# Patient Record
Sex: Male | Born: 1980 | Race: Black or African American | Hispanic: No | Smoking: Current every day smoker
Health system: Southern US, Community
[De-identification: ages and names within clinical notes are randomized; demographics above are authoritative.]

## PROBLEM LIST (undated history)

## (undated) DIAGNOSIS — M109 Gout, unspecified: Secondary | ICD-10-CM

## (undated) DIAGNOSIS — J45909 Unspecified asthma, uncomplicated: Secondary | ICD-10-CM

## (undated) DIAGNOSIS — H18603 Keratoconus, unspecified, bilateral: Secondary | ICD-10-CM

## (undated) DIAGNOSIS — I10 Essential (primary) hypertension: Secondary | ICD-10-CM

## (undated) DIAGNOSIS — R9431 Abnormal electrocardiogram [ECG] [EKG]: Secondary | ICD-10-CM

## (undated) DIAGNOSIS — H548 Legal blindness, as defined in USA: Secondary | ICD-10-CM

## (undated) DIAGNOSIS — R079 Chest pain, unspecified: Secondary | ICD-10-CM

## (undated) DIAGNOSIS — E785 Hyperlipidemia, unspecified: Secondary | ICD-10-CM

## (undated) HISTORY — DX: Abnormal electrocardiogram (ECG) (EKG): R94.31

## (undated) HISTORY — DX: Hyperlipidemia, unspecified: E78.5

## (undated) HISTORY — DX: Chest pain, unspecified: R07.9

## (undated) HISTORY — PX: OTHER SURGICAL HISTORY: SHX169

## (undated) HISTORY — PX: EYE SURGERY: SHX253

---
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
## 2002-09-08 ENCOUNTER — Emergency Department (HOSPITAL_COMMUNITY): Admission: EM | Admit: 2002-09-08 | Discharge: 2002-09-08 | Payer: Self-pay | Admitting: Emergency Medicine

## 2002-09-08 ENCOUNTER — Encounter: Payer: Self-pay | Admitting: Emergency Medicine

## 2003-08-16 ENCOUNTER — Emergency Department (HOSPITAL_COMMUNITY): Admission: EM | Admit: 2003-08-16 | Discharge: 2003-08-16 | Payer: Self-pay

## 2004-03-14 ENCOUNTER — Encounter: Payer: Self-pay | Admitting: Cardiology

## 2004-03-14 ENCOUNTER — Inpatient Hospital Stay (HOSPITAL_COMMUNITY): Admission: EM | Admit: 2004-03-14 | Discharge: 2004-03-15 | Payer: Self-pay | Admitting: *Deleted

## 2004-09-05 ENCOUNTER — Emergency Department (HOSPITAL_COMMUNITY): Admission: EM | Admit: 2004-09-05 | Discharge: 2004-09-05 | Payer: Self-pay | Admitting: Family Medicine

## 2006-04-18 ENCOUNTER — Ambulatory Visit: Payer: Self-pay | Admitting: Cardiology

## 2006-04-18 ENCOUNTER — Inpatient Hospital Stay (HOSPITAL_COMMUNITY): Admission: EM | Admit: 2006-04-18 | Discharge: 2006-04-19 | Payer: Self-pay | Admitting: Emergency Medicine

## 2006-10-07 ENCOUNTER — Emergency Department (HOSPITAL_COMMUNITY): Admission: EM | Admit: 2006-10-07 | Discharge: 2006-10-07 | Payer: Self-pay | Admitting: Emergency Medicine

## 2007-05-15 ENCOUNTER — Emergency Department (HOSPITAL_COMMUNITY): Admission: EM | Admit: 2007-05-15 | Discharge: 2007-05-15 | Payer: Self-pay | Admitting: Emergency Medicine

## 2007-07-05 ENCOUNTER — Emergency Department (HOSPITAL_COMMUNITY): Admission: EM | Admit: 2007-07-05 | Discharge: 2007-07-05 | Payer: Self-pay | Admitting: Emergency Medicine

## 2007-08-11 ENCOUNTER — Emergency Department (HOSPITAL_COMMUNITY): Admission: EM | Admit: 2007-08-11 | Discharge: 2007-08-11 | Payer: Self-pay | Admitting: Family Medicine

## 2007-08-23 ENCOUNTER — Emergency Department (HOSPITAL_COMMUNITY): Admission: EM | Admit: 2007-08-23 | Discharge: 2007-08-24 | Payer: Self-pay | Admitting: Emergency Medicine

## 2007-08-27 ENCOUNTER — Emergency Department (HOSPITAL_COMMUNITY): Admission: EM | Admit: 2007-08-27 | Discharge: 2007-08-27 | Payer: Self-pay | Admitting: Emergency Medicine

## 2008-11-08 ENCOUNTER — Emergency Department (HOSPITAL_COMMUNITY): Admission: EM | Admit: 2008-11-08 | Discharge: 2008-11-08 | Payer: Self-pay | Admitting: Family Medicine

## 2009-03-16 ENCOUNTER — Emergency Department (HOSPITAL_COMMUNITY): Admission: EM | Admit: 2009-03-16 | Discharge: 2009-03-16 | Payer: Self-pay | Admitting: Emergency Medicine

## 2009-03-24 ENCOUNTER — Emergency Department (HOSPITAL_COMMUNITY): Admission: EM | Admit: 2009-03-24 | Discharge: 2009-03-24 | Payer: Self-pay | Admitting: Family Medicine

## 2009-07-26 ENCOUNTER — Emergency Department (HOSPITAL_COMMUNITY): Admission: EM | Admit: 2009-07-26 | Discharge: 2009-07-26 | Payer: Self-pay | Admitting: Emergency Medicine

## 2009-08-19 ENCOUNTER — Emergency Department (HOSPITAL_COMMUNITY): Admission: EM | Admit: 2009-08-19 | Discharge: 2009-08-19 | Payer: Self-pay | Admitting: Emergency Medicine

## 2009-08-22 ENCOUNTER — Emergency Department (HOSPITAL_COMMUNITY): Admission: EM | Admit: 2009-08-22 | Discharge: 2009-08-22 | Payer: Self-pay | Admitting: Emergency Medicine

## 2009-08-24 ENCOUNTER — Emergency Department (HOSPITAL_COMMUNITY): Admission: EM | Admit: 2009-08-24 | Discharge: 2009-08-24 | Payer: Self-pay | Admitting: Emergency Medicine

## 2009-11-29 ENCOUNTER — Emergency Department (HOSPITAL_COMMUNITY): Admission: EM | Admit: 2009-11-29 | Discharge: 2009-11-30 | Payer: Self-pay | Admitting: Emergency Medicine

## 2010-02-13 ENCOUNTER — Emergency Department (HOSPITAL_COMMUNITY): Admission: EM | Admit: 2010-02-13 | Discharge: 2010-02-13 | Payer: Self-pay | Admitting: Emergency Medicine

## 2010-05-01 ENCOUNTER — Emergency Department (HOSPITAL_COMMUNITY): Admission: EM | Admit: 2010-05-01 | Discharge: 2010-05-01 | Payer: Self-pay | Admitting: Emergency Medicine

## 2010-05-02 DIAGNOSIS — E669 Obesity, unspecified: Secondary | ICD-10-CM | POA: Insufficient documentation

## 2010-05-02 DIAGNOSIS — I10 Essential (primary) hypertension: Secondary | ICD-10-CM | POA: Insufficient documentation

## 2010-05-02 DIAGNOSIS — M109 Gout, unspecified: Secondary | ICD-10-CM | POA: Insufficient documentation

## 2010-05-03 ENCOUNTER — Ambulatory Visit: Payer: Self-pay | Admitting: Cardiovascular Disease

## 2010-05-03 ENCOUNTER — Ambulatory Visit: Payer: Self-pay | Admitting: Cardiology

## 2010-05-03 ENCOUNTER — Observation Stay (HOSPITAL_COMMUNITY): Admission: AD | Admit: 2010-05-03 | Discharge: 2010-05-05 | Payer: Self-pay | Admitting: Cardiology

## 2010-05-03 ENCOUNTER — Encounter: Payer: Self-pay | Admitting: Physician Assistant

## 2010-05-03 DIAGNOSIS — E785 Hyperlipidemia, unspecified: Secondary | ICD-10-CM

## 2010-05-03 DIAGNOSIS — R079 Chest pain, unspecified: Secondary | ICD-10-CM | POA: Insufficient documentation

## 2010-05-03 DIAGNOSIS — R9431 Abnormal electrocardiogram [ECG] [EKG]: Secondary | ICD-10-CM

## 2010-05-03 HISTORY — DX: Chest pain, unspecified: R07.9

## 2010-05-03 HISTORY — DX: Hyperlipidemia, unspecified: E78.5

## 2010-05-03 HISTORY — DX: Abnormal electrocardiogram (ECG) (EKG): R94.31

## 2010-05-04 ENCOUNTER — Encounter: Payer: Self-pay | Admitting: Cardiology

## 2010-06-09 ENCOUNTER — Ambulatory Visit: Payer: Self-pay | Admitting: Pulmonary Disease

## 2010-06-12 ENCOUNTER — Telehealth: Payer: Self-pay | Admitting: Pulmonary Disease

## 2010-06-13 ENCOUNTER — Ambulatory Visit: Payer: Self-pay | Admitting: Cardiology

## 2010-06-24 ENCOUNTER — Emergency Department (HOSPITAL_COMMUNITY)
Admission: EM | Admit: 2010-06-24 | Discharge: 2010-06-24 | Payer: Self-pay | Source: Home / Self Care | Admitting: Emergency Medicine

## 2010-06-30 ENCOUNTER — Ambulatory Visit: Payer: Self-pay | Admitting: Pulmonary Disease

## 2010-07-04 ENCOUNTER — Telehealth: Payer: Self-pay | Admitting: Pulmonary Disease

## 2010-07-22 ENCOUNTER — Encounter: Payer: Self-pay | Admitting: Cardiology

## 2010-08-01 NOTE — Assessment & Plan Note (Signed)
Summary: eph/chest pain/mt   Visit Type:  Initial Consult Primary Perez Dirico:  none  CC:  chest pain -- anxiety -- HTN.  History of Present Illness: Primary Cardiologist:  Will be Dr. Shawnie Pons   Samuel Decker is a 30 yo male who recently went to the ED for evaluation of chest pain in the setting of anxiety.  No cardiac enzymes were checked.  His CXR was normal.  He did have an echocardiogram that demonstrated normal LVF and a GXT myoview that demonstrated no ischemia in 2005.  His blood pressures were 174/99 and 153/81 in the ED.  He states he was having an argument with his girlfriend on 05/01/2010 when he developed sharp substernal chest pain.  It lasted an hour or so.  He then developed back pain.  Denies a tearing or ripping sensation.  This lasted after he left the ED for 3-4 hours.   He denies any recent heavy lifting or pushing or pulling.  He denies assoc nausea, diaphoresis, dyspnea.  He is somewhat active.  He denies dyspnea with exertion.  Denies exertional chest pain.  He denies orthopnea or PND.  No edema.  No syncope.  No near syncope.  No lightheadedness.  No hemoptysis.  He did have some pleuritic chest pain when he went to the ED.  He has not had any further symptoms like this.  He denies recent URI symptoms.  Preventive Screening-Counseling & Management  Alcohol-Tobacco     Smoking Status: quit  Caffeine-Diet-Exercise     Does Patient Exercise: no      Drug Use:  no.    Current Medications (verified): 1)  None  Allergies (verified): No Known Drug Allergies  Past History:  Past Medical History: Hypertension Hyperlipidemia Gout Obesity h/o Childhood Seizures Asthma . . .no symptoms since childhood  Past Surgical History: s/p orchipexy 2/2 testicular torsion at age 40  Family History: CHF:  Dad Lupus Family History of Coronary Artery Disease: Dad with MI age 67s Family History of Depression:  Family History of Diabetes: Dad Family History of  Hyperlipidemia:  Family History of Hypertension: "everyone"  Social History: Full Time  --- Industrial/product designer (sales) Single -- engaged Tobacco Use - Former (remote smoker in past) Alcohol Use - not now Regular Exercise - no Drug Use - no Smoking Status:  quit Does Patient Exercise:  no Drug Use:  no  Review of Systems       The patient denies fevers, chills, cough, melena, hematochezia, dysuria, hematuria, rashes, arthralgias, myalgias, heat or cold intolerance.  Please see the HPI.  All other systems reviewed and negative.   Vital Signs:  Patient profile:   30 year old male Height:      73 inches Weight:      332 pounds BMI:     43.96 Pulse rate:   70 / minute BP sitting:   146 / 88  (right arm) Cuff size:   regular  Vitals Entered By: Hardin Negus, RMA (May 03, 2010 1:44 PM)  Physical Exam  General:  Well nourished, well developed, obese male in no acute distress HEENT: Normal Neck: No JVD Cardiac:  Normal S1, S2; RRR; no murmur; no murmur with valsalva Lungs:  Clear to auscultation bilaterally, no wheezing, rhonchi or rales Abd: Soft, nontender, no hepatomegaly, no bruits Ext: No clubbing, cyanosis or edema Vascular: No carotid  bruits; Femoral arteries without bruits; DP/PT 2+ bilaterally Skin: Warm and dry MSK:  No deformities Lymph: No adenopathy Psych:  Normally interactive Endocrine:  No thyromegaly Neuro:  CNs 2-12 grossly intact; strength normal in all extremities; no focal deficits   EKG  Procedure date:  05/03/2010  Findings:      Normal sinus rhythm with rate of:  70 Normal axis TW inversions in 1, aVL, 2, aVF, V3-6 LVH   Impression & Recommendations:  Problem # 1:  CHEST PAIN UNSPECIFIED (ICD-786.50)  Atypical symptoms. He does have a markedly abnormal EKG.  His prior EKGs from 2005, 2007 and 2009 have been reviewed and his changes have worsened over the years. No further symptoms now and he denies exertional symptoms. D/w Dr.  Riley Kill who also saw the patient today. After further discussions with the patient and his friend, he will be admitted [facility] for overnight observation. Start Toprol. Get chest CT if Creatinine ok to r/o Aortic Dissection.  Hold off on Aspirin with Chest CT pending.  If Chest CT ok, start ASA. Check a DDimer.   His updated medication list for this problem includes:    Metoprolol Succinate 50 Mg Xr24h-tab (Metoprolol succinate) .Marland Kitchen... Take 1 tablet by mouth once a day  Problem # 2:  HYPERTENSION (ICD-401.9)  Will start Toprol XL 50 mg once daily.  Orders: EKG w/ Interpretation (93000)  His updated medication list for this problem includes:    Metoprolol Succinate 50 Mg Xr24h-tab (Metoprolol succinate) .Marland Kitchen... Take 1 tablet by mouth once a day  Problem # 3:  HYPERLIPIDEMIA (ICD-272.4) Check FLP in the hospital.  Problem # 4:  OBESITY (ICD-278.00) Discussed weight loss today. He will need an OP sleep study.  Problem # 5:  ELECTROCARDIOGRAM, ABNORMAL (ICD-794.31) Check Echocardiogram.

## 2010-08-03 NOTE — Progress Notes (Signed)
Summary: nos appt  Phone Note Call from Patient   Caller: [email or web address]  Call For: clance Summary of Call: Rsc nos from 12/9 to 12/30. Initial call taken by: Darletta Moll,  June 12, 2010 2:53 PM

## 2010-08-03 NOTE — Progress Notes (Signed)
Summary: nos appt  Phone Note Call from Patient   Caller: [email or web address]  Call For: Samuel Decker Summary of Call: In ref to nos from 12/30, pt states he doesn't want to rsc. Initial call taken by: Darletta Moll,  July 04, 2010 10:39 AM

## 2010-09-11 LAB — POCT CARDIAC MARKERS
CKMB, poc: 1.4 ng/mL (ref 1.0–8.0)
Myoglobin, poc: 138 ng/mL (ref 12–200)
Troponin i, poc: <0.05 ng/mL (ref 0.00–0.09)

## 2010-09-11 LAB — POCT I-STAT, CHEM 8
BUN: 11 mg/dL (ref 6–23)
Calcium, Ion: 1.13 mmol/L (ref 1.12–1.32)
Chloride: 105 mEq/L (ref 96–112)
Creatinine, Ser: 1.1 mg/dL (ref 0.4–1.5)
Glucose, Bld: 94 mg/dL (ref 70–99)

## 2010-09-12 LAB — BASIC METABOLIC PANEL
Calcium: 9 mg/dL (ref 8.4–10.5)
Creatinine, Ser: 1.12 mg/dL (ref 0.4–1.5)
GFR calc Af Amer: >60 mL/min (ref >60)

## 2010-09-12 LAB — D-DIMER, QUANTITATIVE: D-Dimer, Quant: <0.22 ug/mL-FEU (ref 0.00–0.48)

## 2010-09-12 LAB — COMPREHENSIVE METABOLIC PANEL
ALT: 46 U/L (ref 0–53)
Albumin: 3.7 g/dL (ref 3.5–5.2)
Alkaline Phosphatase: 87 U/L (ref 39–117)
Glucose, Bld: 89 mg/dL (ref 70–99)
Potassium: 3.9 mEq/L (ref 3.5–5.1)
Sodium: 137 mEq/L (ref 135–145)
Total Protein: 7.4 g/dL (ref 6.0–8.3)

## 2010-09-12 LAB — CARDIAC PANEL(CRET KIN+CKTOT+MB+TROPI)
CK, MB: 2.3 ng/mL (ref 0.3–4.0)
CK, MB: 2.4 ng/mL (ref 0.3–4.0)
Relative Index: 0.6 (ref 0.0–2.5)
Relative Index: 0.6 (ref 0.0–2.5)
Relative Index: 0.7 (ref 0.0–2.5)
Total CK: 361 U/L — ABNORMAL HIGH (ref 7–232)
Troponin I: 0.01 ng/mL (ref 0.00–0.06)
Troponin I: 0.01 ng/mL (ref 0.00–0.06)
Troponin I: <0.01 ng/mL (ref 0.00–0.06)

## 2010-09-12 LAB — CBC
HCT: 42.4 % (ref 39.0–52.0)
RBC: 5.04 MIL/uL (ref 4.22–5.81)
RDW: 14 % (ref 11.5–15.5)
WBC: 10.4 10*3/uL (ref 4.0–10.5)

## 2010-09-12 LAB — TSH: TSH: 0.512 u[IU]/mL (ref 0.350–4.500)

## 2010-10-06 LAB — POCT I-STAT, CHEM 8
Creatinine, Ser: 1.1 mg/dL (ref 0.4–1.5)
HCT: 46 % (ref 39.0–52.0)
Hemoglobin: 15.6 g/dL (ref 13.0–17.0)
Potassium: 3.9 mEq/L (ref 3.5–5.1)
Sodium: 140 mEq/L (ref 135–145)
TCO2: 25 mmol/L (ref 0–100)

## 2010-10-06 LAB — URINE MICROSCOPIC-ADD ON

## 2010-10-06 LAB — URINALYSIS, ROUTINE W REFLEX MICROSCOPIC
Leukocytes, UA: NEGATIVE
Protein, ur: NEGATIVE mg/dL
Urobilinogen, UA: 0.2 mg/dL (ref 0.0–1.0)

## 2010-11-17 NOTE — H&P (Signed)
NAME:  Samuel Decker, Samuel Decker NO.:  [id number]   MEDICAL RECORD NO.:  [id number]                   PATIENT TYPE:  INP   LOCATION:  1828                                 FACILITY:  [facility]   PHYSICIAN:  Esmond Bing, M.D.               DATE OF BIRTH:  1980/12/28   DATE OF ADMISSION:  03/13/2004  DATE OF DISCHARGE:                                HISTORY & PHYSICAL   HISTORY OF PRESENT ILLNESS:  A 30 year old generally healthy gentleman  presented to urgent care with chest pain and found to have EKG abnormalities  prompting transfer to [facility]. Samuel Decker has enjoyed generally  good health. He had severe asthma as a child requiring multiple hospital  admissions, but has not had significant difficulty with this over the past  five years. He may have borderline hypertension, but has never been treated  for same. He denies use of tobacco products. There is no history of lipid  abnormalities nor diabetes. He has had intermittent chest discomfort for the  past two years that has been mild to moderate in severity. There may be in  relationship to exertion. 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 The pain intensified and  was associated with dyspnea prompting presentation for assessment to urgent  care. The discomfort was described as vague anterior chest tightness,  moderate severity without radiation. There was associated dyspnea, but no  diaphoresis. He did have some mild nausea. He was given sublingual  nitroglycerin with improvement of his symptoms. They are now recurring as  this evaluation is performed.   The patient is not excessively athletic, but does play some basketball. Work  is his most strenuous activity. He reports generally good exercise tolerance  without exertional dyspnea. He experienced no issues with activity as a  child, except as related to his asthma. He has not seen a  physician for some  time. He has not had medical coverage, but will have it soon in conjunction  with his current employment.   PAST MEDICAL HISTORY:  Otherwise remarkable for surgery some years ago for  testicular torsion. He takes no medication routinely. He has no known  allergies.   SOCIAL HISTORY:  No excessive use of alcohol. He works for Engineer, technical sales.  Unmarried.   FAMILY HISTORY:  His father had premature coronary disease, developing  myocardial infarction in his 50s. He is still alive, but has what sounds  like ischemic cardiomyopathy.   REVIEW OF SYSTEMS:  No GI problems. Intermittent headaches. All other  systems reviewed and are negative.   PHYSICAL EXAMINATION:  GENERAL: On exam, well-developed, healthy appearing,  overweight gentleman in no acute distress.  VITAL SIGNS: Temperature 98.1, blood pressure 140/60, heart rate 70 and  regular, respirations 16.  HEENT: Anicteric sclerae; normal lids and conjunctivae; pupils equal, round,  and reactive to light.  NECK: No jugular venous distention; no carotid bruits.  ENDOCRINE: No thyromegaly.  HEMATOPOIETIC: No adenopathy.  LUNGS: Clear.  CARDIAC: Normal first and second heart sounds.  ABDOMEN: Soft and nontender. No organomegaly.  EXTREMITIES: Normal distal pulses. No edema.  NEUROMUSCULAR: Symmetric strength and tone.  MUSCULOSKELETAL: No joint deformities.   Initial point of care markers are negative. Pulse oximetry is 98% on room  air. Drug screen is negative. CBC is normal. Chemistry profile is normal.   EKG: Normal sinus rhythm; inferolateral T-wave inversion. Two tracings  obtained at 6 p.m. and 8 p.m. are similar. Chest x-ray: Suboptimal  inspiration; NAD.   IMPRESSION:  Samuel Decker presents with chest discomfort that has a somewhat  ischemic quality and electrocardiogram abnormalities. At age 50, without  significant cardiovascular risk factors other than family history, his risks  of coronary disease is  extremely low. Other considerations include  cardiomyopathy, including hypertrophic cardiomyopathy, coronary artery  anomalies, Reye's syndrome, and sarcoid. Endocrine abnormalities including  hypothyroidism are of consideration as well as pulmonary embolism. Since his  chest pain is recurring, we will try intravenous nitroglycerin for  treatment. He will also be given aspirin and low-molecular weight heparin at  full doses. An echocardiogram will be obtained to explore for structural  heart disease. Serial cardiac markers, TSH level, and D-dimer have also been  requested. We will seek his most recent prior EKG to determine if these  abnormalities are chronic. Based on this initial testing and treatment,  further assessment will be obtained as appropriate.                                                Grainger Bing, M.D.    RR/MEDQ  D:  03/14/2004  T:  03/14/2004  Job:  [id number]

## 2010-11-17 NOTE — Discharge Summary (Signed)
NAME:  Samuel Decker, Samuel Decker                        ACCOUNT NO.:  [id number]   MEDICAL RECORD NO.:  [id number]                   PATIENT TYPE:  INP   LOCATION:  6524                                 FACILITY:  [facility]   PHYSICIAN:  Gene Serpe, P.A. [facility]                DATE OF BIRTH:  [birth year]   DATE OF ADMISSION:  03/13/2004  DATE OF DISCHARGE:                           DISCHARGE SUMMARY - REFERRING   DISCHARGE DIAGNOSES:  1.  Nonischemic chest pain.      1.  Normal serial cardiac markers/abnormal electrocardiogram.      2.  Normal exercise stress Cardiolite.      3.  Normal left ventricular function.  2.  Mixed dyslipidemia.  3.  History of asthma.  4.  Obesity.   PROCEDURES:  1.  Exercise stress Cardiolite, September 14th.  2.  2-D echocardiogram.   REASON FOR ADMISSION:  Please refer to admission note.   LABORATORY DATA:  Cardiac markers:  Peak CPK 608/normal MB and troponin-I  markers.  D-dimer 3.30.  WBC 9.5, hemoglobin 14.9/hematocrit 44, platelets  298, normal electrolytes and renal function.  Lipid profile:  Total  cholesterol 190, triglycerides 301, HDL 28, LDL 102 (cholesterol/HDL ratio  6.8).   ADMISSION CXR:  NAD.   HOSPITAL COURSE:  Patient ruled out for a myocardial infarction with normal  MB and troponin-I markers.  Total CPR was, however, elevated with a peak of  608.  Patient also presented with an abnormal electrocardiogram suggestive  of inferolateral ischemia.  Echocardiography, however, revealed no WNAs with  normal left ventricular function (55-65%).   Patient was subsequently referred for exercise stress testing.  Perfusion  imaging was normal with calculated ejection fraction of 51%.   No further cardiac workup recommended.  Patient is strongly encouraged to  establish with a primary care physician.  Dietary modification and weight  loss also recommended for lipid management.   MEDICATIONS AT DISCHARGE:  Coated aspirin 81 mg daily.   Patient exercised  total of 8 minutes on standard Bruce protocol with no  reported chest pain.  Subsequent review of perfusion imaging revealed no  abnormalities; calculated ejection fraction of 51%.   No further cardiac workup was recommended.  Patient was instructed to  establish with a primary care physician following discharge.   DISCHARGE MEDICATIONS:  Coated aspirin 81 mg daily.   INSTRUCTIONS:  1.  Maintain low fat diet and initiate exercise program for weight loss and      lipid management.  2.  Establish with a primary care physician.  3.  Follow up with Dr. Loraine Leriche Pulsipher on an as needed basis.                                                Gene Serpe, P.A. [facility]  GS/MEDQ  D:  03/15/2004  T:  03/15/2004  Job:  [id number]

## 2010-11-17 NOTE — H&P (Signed)
NAME:  Samuel, Decker NO.:  [id number]   MEDICAL RECORD NO.:  [id number]          PATIENT TYPE:  INP   LOCATION:  4713                         FACILITY:  [facility]   PHYSICIAN:  Jesse Sans. Wall, MD, FACCDATE OF BIRTH:  1980-12-01   DATE OF ADMISSION:  04/18/2006  DATE OF DISCHARGE:  04/19/2006                                HISTORY & PHYSICAL   CARDIOLOGIST:  The patient was previously seen by Dr. Dietrich Pates and Dr.  Gerri Spore, he is new to Dr. Daleen Squibb.   PRIMARY CARE PHYSICIAN:  None.   CHIEF COMPLAINT:  Chest pain.   HISTORY OF PRESENT ILLNESS:  [name] is a 30 year old African American  male with no known history of coronary disease who was admitted to [facility] in  03/2004, with chest pain.  He ruled out at that time and had an exercise  Myoview that was negative for ischemia.  He has been in his usual state of  health.  He notes chronic atypical chest pains off and on that seem to be  worse when he increases his weight.  However, today, he noted new onset of  substernal chest tightness associated with dyspnea, nausea and diaphoresis.  There was no radiation.  There is no syncope or presyncope.  It lasted about  15 minutes and then resolved.  His pain today was new and unlike any  symptoms he has had in the past.  He has had no exertional symptoms.  He has  had no nocturnal symptoms.  He received aspirin via EMS today and is now  pain free.   PAST MEDICAL HISTORY:  Significant for:  1. Untreated hypertension and untreated dyslipidemia (in September, 2005,      his total cholesterol was 190, triglycerides 301, HDL 28 and LDL 102).  2. He has a history of testicular torsions status post repair.  3. There is a history of asthma.  4. He denies any history of diabetes mellitus.  5. Echocardiogram in September, 2005, revealed an EF of 55-65%.  LV wall      thickness was normal.   MEDICATIONS:  None.   ALLERGIES:  None.   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  He admits to smoking occasionally.  Denies any alcohol abuse.  Denies any current drug abuse but has smoked marijuana in the past.   FAMILY HISTORY:  Significant for coronary disease.  His father had a  myocardial infarction in his 33s.   REVIEW OF SYSTEMS:  Please see HPI.  Denies any fevers, coughs.  No  hematochezia, melena or dysuria, dysphagia, odynophagia.  No symptoms  consistent with amaurosis fugax or TIAs.  He has had some __________  symptoms.  Denies any indigestion.  Rest of review of systems is negative.   PHYSICAL EXAMINATION:  He is a  well nourished, well developed male.  Blood  pressure is 147/__________, pulse 71, respirations 15. oxygen saturation  100% on 2 liters.  Head normocephalic, atraumatic.  Eyes:  PERRLA.  EOMI,  sclerae clear.  Lymph without lymphadenopathy.  Endocrine without  thyromegaly.  Vascular exam:  No carotid bruits bilaterally.  Femoral pulse  2+ bilaterally without bruits.  Distal pulses, dorsalis pedis and posterior  tibialis pulses are 2+ bilaterally.  Cardiac:  Normal S1, S2, regular rate  and rhythm without murmurs, clicks, rubs or gallops.  Lungs:  Clear to  auscultation bilaterally without wheezing, rhonchi or rales.  Abdomen is  soft, nontender, with normoactive bowel sounds, no organomegaly.  Extremities without clubbing, cyanosis or edema.  Musculoskeletal without  spine or CVA tenderness.  Neurologic:  He is alert and oriented x3.  Cranial  nerves II-XII are grossly intact.   DIAGNOSTIC STUDIES:  Chest x-ray shows borderline cardiomegaly that was  stable and no acute disease.  EKG reveals sinus rhythm, heart rate of 71,  normal axis.  T-wave inversions at V5 and V6 without significant change from  03/13/2004.   LABORATORY DATA:  Hemoglobin is 16.3, hematocrit 48, sodium 138, potassium  4.1, chloride 109, BUN 10, creatinine 1.2, glucose 88.  __________  markers  negative x3.  D-dimer negative  at less than 0.22.   IMPRESSION:  1. Chest pain.  2. Untreated hypertension.  3. Untreated dyslipidemia.  4. Nonischemic Myoview, 03/2004.  5. Good left ventricular function.  6. Family history of coronary artery disease.  7. Remote history of cigarette use.   PLAN:  The patient is also being seen by Dr. Daleen Squibb.  Plan to admit him and  treat him with aspirin, Lovenox.  We will also add Lopressor 25 mg twice a  day while he is in-house.  We will recheck his lipids.  We will check serial  enzymes.  As long as he ruled out for myocardial infarction, we will  discharge him tomorrow with plans for outpatient Myoview testing.  At  discharge, the patient's lipid panel remains abnormal.  We should consider  initiation of Tricor or fenofibrate for control of his lipids.  Also, at  discharge, as long as he ruled out for myocardial infarction, we should stop  his beta blocker and place him on amlodipine 5 mg and HCTZ 25 mg daily.      Tereso Newcomer, PA-C      Jesse Sans. Daleen Squibb, MD, [facility]  Electronically Signed    SW/MEDQ  D:  04/18/2006  T:  04/19/2006  Job:  [id number]

## 2010-11-17 NOTE — Discharge Summary (Signed)
NAME:  Samuel Decker, Samuel Decker NO.:  [id number]   MEDICAL RECORD NO.:  [id number]          PATIENT TYPE:  INP   LOCATION:  4713                         FACILITY:  [facility]   PHYSICIAN:  Jesse Sans. Wall, MD, FACCDATE OF BIRTH:  April 22, 1981   DATE OF ADMISSION:  04/18/2006  DATE OF DISCHARGE:  04/19/2006                           DISCHARGE SUMMARY - REFERRING   DISCHARGE DIAGNOSES:  1. Noncardiac chest discomfort.  2. Mixed hyperlipidemia.  3. Hypertension.   Mr. Bamber is a 30 year old African American male who was evaluated for  chest discomfort in September 2005 where an exercise Myoview was negative  for myocardial ischemia.  He has chronic atypical chest discomfort that  worsens when he lifts weights.  On the day of presentation, he developed a  tightness associated with shortness of breath, nausea, and diaphoresis  without radiation or presyncope.  The discomfort lasted approximately 15  minutes and he felt that this was new, unlike prior pain that he had.  His  medical history is notable for hypertension for which he receives no  treatment, hyperlipidemia for which he also receives no treatment, and a  history of asthma.   LABORATORY DATA:  Chest x-ray on the 18th showed stable borderline  cardiomegaly without acute abnormalities.  Admission weight was 300.4.  H&H  15.2 and 45.3.  Normal indices.  Platelets 296.  WBCs 8.6.  PTT 30.  PT  13.6.  Sodium 139.  Potassium 3.9.  BUN 8.  Creatinine 1.0.  Normal LFTs.  Hemoglobin A1c 6.0.  CK-MBs, relative indexes, and troponins were negative  for myocardial infarction.  Fasting lipids showed a total cholesterol 171,  triglycerides 202, HDL 27, LDL 104.  TSH 0.911.  EKGs show normal sinus  rhythm, normal axis, early repolarization, some biphasic T waves in V3-V6  comparable to old EKGs in September 2005.   HOSPITAL COURSE:  Mr. Derousse was admitted to 75.  Placed on Lovenox and  beta blockers for his hypertension.   Overnight, he did not have any further  chest discomfort.  He ruled out for myocardial infarction.  Dr. Daleen Squibb did not  feel this is a cardiac etiology to his discomfort and felt that he had a  mixed hyperlipidemia as well as hypertension.  He felt that he could be  discharged home with outpatient followup.   DISPOSITION:  He was given permission to return to work on Monday, April 22, 2006.  Maintain a low-salt/fat cholesterol diet.  Activities were not  restricted.  He received a prescription for amlodipine 5 mg daily.  He will  have a 2-day stress Myoview at [facility] on April 29, 2006 at  7:45 a.m.  He was advised nothing to eat or drink after midnight on April 28, 2006 except he was given permission to take his medication.  He was also  advised no caffeine or tobacco prior to test.  He was advised to wear  sneakers and comfortable shoes.  He will follow up with Dr. Juanito Doom on  May 08, 2006 at 11 a.m. to follow up on the stress Myoview and  his  hypertension.  The patient was also advised to obtain a primary care  physician.  Discharge time less than 30 minutes.      Joellyn Rued, PA-C      Jesse Sans. Daleen Squibb, MD, [facility]  Electronically Signed    EW/MEDQ  D:  04/19/2006  T:  04/21/2006  Job:  [id number]

## 2011-03-23 LAB — I-STAT 8, (EC8 V) (CONVERTED LAB)
Acid-Base Excess: 1
Bicarbonate: 27.2 — ABNORMAL HIGH
Glucose, Bld: 87
Hemoglobin: 16.7
Operator id: 235561
Potassium: 4.1
Sodium: 138
TCO2: 29

## 2012-01-24 ENCOUNTER — Emergency Department (HOSPITAL_COMMUNITY)
Admission: EM | Admit: 2012-01-24 | Discharge: 2012-01-24 | Disposition: A | Discharge status: Home / Self Care | Payer: Self-pay | Attending: Emergency Medicine | Admitting: Emergency Medicine

## 2012-01-24 ENCOUNTER — Encounter (HOSPITAL_COMMUNITY): Payer: Self-pay | Admitting: *Deleted

## 2012-01-24 DIAGNOSIS — J45909 Unspecified asthma, uncomplicated: Secondary | ICD-10-CM | POA: Insufficient documentation

## 2012-01-24 DIAGNOSIS — I10 Essential (primary) hypertension: Secondary | ICD-10-CM | POA: Insufficient documentation

## 2012-01-24 HISTORY — DX: Essential (primary) hypertension: I10

## 2012-01-24 HISTORY — DX: Unspecified asthma, uncomplicated: J45.909

## 2012-01-24 LAB — COMPREHENSIVE METABOLIC PANEL
AST: 24 U/L (ref 0–37)
Albumin: 3.8 g/dL (ref 3.5–5.2)
BUN: 12 mg/dL (ref 6–23)
Creatinine, Ser: 1.09 mg/dL (ref 0.50–1.35)
Potassium: 3.9 mEq/L (ref 3.5–5.1)
Total Protein: 7.7 g/dL (ref 6.0–8.3)

## 2012-01-24 LAB — CBC WITH DIFFERENTIAL/PLATELET
Basophils Absolute: 0 10*3/uL (ref 0.0–0.1)
Basophils Relative: 0 % (ref 0–1)
Eosinophils Absolute: 0.4 10*3/uL (ref 0.0–0.7)
Hemoglobin: 14.8 g/dL (ref 13.0–17.0)
MCH: 27.7 pg (ref 26.0–34.0)
MCHC: 33.3 g/dL (ref 30.0–36.0)
Monocytes Absolute: 0.6 10*3/uL (ref 0.1–1.0)
Monocytes Relative: 5 % (ref 3–12)
Neutrophils Relative %: 54 % (ref 43–77)
RDW: 14 % (ref 11.5–15.5)

## 2012-01-24 LAB — TROPONIN I: Troponin I: <0.3 ng/mL (ref <0.30)

## 2012-01-24 MED ORDER — CLONIDINE HCL 0.2 MG PO TABS
0.2000 mg | ORAL_TABLET | Freq: Once | ORAL | Status: DC
  Filled 2012-01-24: qty 1

## 2012-01-24 MED ORDER — LISINOPRIL 20 MG PO TABS: 10.0000 mg | ORAL_TABLET | Freq: Every day | ORAL | Status: DC

## 2012-01-24 NOTE — ED Notes (Signed)
MD at bedside. 

## 2012-01-24 NOTE — ED Provider Notes (Signed)
History     CSN: [id number]  Arrival date & time 01/24/12  1720   First MD Initiated Contact with Patient 01/24/12 2052      Chief Complaint  Patient presents with  . Chest Pain    ) The history is limited by a developmental delay.   The pt has had mid-chest pain with dizziness for 3-4 weeks intermittently.  Past Medical History  Diagnosis Date  . Asthma   . Hypertension     History reviewed. No pertinent past surgical history.  No family history on file.  History  Substance Use Topics  . Smoking status: Never Smoker   . Smokeless tobacco: Not on file  . Alcohol Use: No      Review of Systems  All other systems reviewed and are negative.    Allergies  Review of patient's allergies indicates no known allergies.  Home Medications   Current Outpatient Rx  Name Route Sig Dispense Refill  . ACETAMINOPHEN 325 MG PO TABS Oral Take 650 mg by mouth every 6 (six) hours as needed. For pain    . LISINOPRIL 20 MG PO TABS Oral Take 0.5 tablets (10 mg total) by mouth daily. 30 tablet 0    BP 151/79  Pulse 85  Temp 98.3 F (36.8 C) (Oral)  Resp 16  SpO2 99%  Physical Exam  Nursing note and vitals reviewed. Constitutional: He is oriented to person, place, and time. He appears well-developed. No distress.  HENT:  Head: Normocephalic and atraumatic.  Eyes: Pupils are equal, round, and reactive to light.  Neck: Normal range of motion.  Cardiovascular: Normal rate and intact distal pulses.        Normal sinus rhythm Rate=92 ST & T wave abnormality, consider inferolateral ischemia Abnormal ECG No significant change since last tracing  Pulmonary/Chest: No respiratory distress. He has no wheezes. He has no rales.  Abdominal: Normal appearance. He exhibits no distension.  Musculoskeletal: Normal range of motion.  Neurological: He is alert and oriented to person, place, and time. No cranial nerve deficit.  Skin: Skin is warm and dry. No rash noted.  Psychiatric: He  has a normal mood and affect. His behavior is normal.    ED Course  Procedures (including critical care time)  Labs Reviewed  COMPREHENSIVE METABOLIC PANEL - Abnormal; Notable for the following:    Glucose, Bld 101 (*)     Total Bilirubin 0.2 (*)     All other components within normal limits  CBC WITH DIFFERENTIAL  TROPONIN I  LAB REPORT - SCANNED   No results found.   1. Hypertension       MDM          Nelia Shi, MD 01/27/12 2300

## 2012-01-24 NOTE — ED Notes (Signed)
Notified EDP about BP 

## 2012-01-24 NOTE — ED Notes (Signed)
Per EDP Be[place],check blood pressure every and report to him, hold clonidine order.

## 2012-01-24 NOTE — ED Notes (Signed)
The pt has had mid-chest pain with dizziness for 3-4 weeks intermittently.

## 2012-07-07 ENCOUNTER — Emergency Department (HOSPITAL_COMMUNITY)
Admission: EM | Admit: 2012-07-07 | Discharge: 2012-07-07 | Disposition: A | Discharge status: Home / Self Care | Payer: Self-pay | Attending: Emergency Medicine | Admitting: Emergency Medicine

## 2012-07-07 ENCOUNTER — Encounter (HOSPITAL_COMMUNITY): Payer: Self-pay | Admitting: Emergency Medicine

## 2012-07-07 DIAGNOSIS — I10 Essential (primary) hypertension: Secondary | ICD-10-CM | POA: Insufficient documentation

## 2012-07-07 DIAGNOSIS — J069 Acute upper respiratory infection, unspecified: Secondary | ICD-10-CM | POA: Insufficient documentation

## 2012-07-07 DIAGNOSIS — J45909 Unspecified asthma, uncomplicated: Secondary | ICD-10-CM | POA: Insufficient documentation

## 2012-07-07 MED ORDER — GUAIFENESIN ER 600 MG PO TB12: 1200.0000 mg | ORAL_TABLET | Freq: Two times a day (BID) | ORAL | Status: DC

## 2012-07-07 MED ORDER — ALBUTEROL SULFATE HFA 108 (90 BASE) MCG/ACT IN AERS
2.0000 | INHALATION_SPRAY | Freq: Once | RESPIRATORY_TRACT | Status: AC
  Administered 2012-07-07: 2 via RESPIRATORY_TRACT
  Filled 2012-07-07: qty 6.7

## 2012-07-07 MED ORDER — HYDROCODONE-HOMATROPINE 5-1.5 MG/5ML PO SYRP: 5.0000 mL | ORAL_SOLUTION | Freq: Four times a day (QID) | ORAL | Status: DC | PRN

## 2012-07-07 NOTE — ED Notes (Signed)
Pt c/o URI with cough and body aches x 6 days

## 2012-07-07 NOTE — ED Notes (Signed)
Pt reports he started 2 days ago with sore throat, nasal congestion then developed cough, cp with cough and green sputum. Pt in no distress.

## 2012-07-12 NOTE — ED Provider Notes (Signed)
History     CSN: [id number]  Arrival date & time 07/07/12  0850   None     Chief Complaint  Patient presents with  . URI  . Generalized Body Aches    (Consider location/radiation/quality/duration/timing/severity/associated sxs/prior treatment) HPI SUBJECTIVE:  Samuel Decker is a 32 y.o. male who present complaining of flu-like symptoms: fevers, chills, myalgias, congestion, sore throat and cough for 6 days. Denies dyspnea or wheezing.    Past Medical History  Diagnosis Date  . Asthma   . Hypertension     History reviewed. No pertinent past surgical history.  History reviewed. No pertinent family history.  History  Substance Use Topics  . Smoking status: Never Smoker   . Smokeless tobacco: Not on file  . Alcohol Use: No      Review of Systems Ten systems reviewed and are negative for acute change, except as noted in the HPI.   Allergies  Review of patient's allergies indicates no known allergies.  Home Medications   Current Outpatient Rx  Name  Route  Sig  Dispense  Refill  . ROBITUSSIN PM PO   Oral   Take 10 mLs by mouth daily as needed. For cold symptoms         . GUAIFENESIN ER 600 MG PO TB12   Oral   Take 2 tablets (1,200 mg total) by mouth 2 (two) times daily.   40 tablet   0   . HYDROCODONE-HOMATROPINE 5-1.5 MG/5ML PO SYRP   Oral   Take 5 mLs by mouth every 6 (six) hours as needed for cough.   120 mL   0     BP 165/108  Pulse 90  Temp 98.1 F (36.7 C) (Oral)  Resp 20  SpO2 96%  Physical Exam Appears moderately ill but not toxic; temperature as noted in vitals. Ears normal. Eyes:glassy appearance, no discharge  Heart: RRR, NO M/G/R Throat and pharynx normal.   Neck supple. No adenopathyhy in the neck.  Sinuses non tender.  The chest is clear. Abdomen is soft and nontende  ED Course  Procedures (including critical care time)  Labs Reviewed - No data to display No results found.   1. URI (upper respiratory infection)        MDM  Patient with symptoms consistent with influenza.  Vitals are stable, low-grade fever.  No signs of dehydration, tolerating PO's.  Lungs are clear. Due to patient's presentation and physical exam a chest x-ray was not ordered bc likely diagnosis of flu.  Discussed the cost versus benefit of Tamiflu treatment with the patient.  The patient understands that symptoms are greater than the recommended 24-48 hour window of treatment.  Patient will be discharged with instructions to orally hydrate, rest, and use over-the-counter medications such as anti-inflammatories ibuprofen and Aleve for muscle aches and Tylenol for fever.  Patient will also be given a cough suppressant.          Arthor Captain, PA-C 07/12/12 1501

## 2012-07-12 NOTE — ED Provider Notes (Signed)
Medical screening examination/treatment/procedure(s) were performed by non-physician practitioner and as supervising physician I was immediately available for consultation/collaboration.   Glynn Octave, MD 07/12/12 [phone]

## 2012-09-03 ENCOUNTER — Emergency Department (HOSPITAL_COMMUNITY): Payer: Medicaid Other

## 2012-09-03 ENCOUNTER — Emergency Department (HOSPITAL_COMMUNITY)
Admission: EM | Admit: 2012-09-03 | Discharge: 2012-09-03 | Disposition: A | Discharge status: Home / Self Care | Payer: Medicaid Other | Attending: Emergency Medicine | Admitting: Emergency Medicine

## 2012-09-03 ENCOUNTER — Encounter (HOSPITAL_COMMUNITY): Payer: Self-pay | Admitting: Emergency Medicine

## 2012-09-03 DIAGNOSIS — Z87828 Personal history of other (healed) physical injury and trauma: Secondary | ICD-10-CM | POA: Insufficient documentation

## 2012-09-03 DIAGNOSIS — J45909 Unspecified asthma, uncomplicated: Secondary | ICD-10-CM | POA: Insufficient documentation

## 2012-09-03 DIAGNOSIS — I10 Essential (primary) hypertension: Secondary | ICD-10-CM | POA: Insufficient documentation

## 2012-09-03 DIAGNOSIS — IMO0002 Reserved for concepts with insufficient information to code with codable children: Secondary | ICD-10-CM | POA: Insufficient documentation

## 2012-09-03 DIAGNOSIS — M5417 Radiculopathy, lumbosacral region: Secondary | ICD-10-CM

## 2012-09-03 MED ORDER — PREDNISONE 20 MG PO TABS: 60.0000 mg | ORAL_TABLET | Freq: Every day | ORAL | Status: DC

## 2012-09-03 MED ORDER — OXYCODONE-ACETAMINOPHEN 5-325 MG PO TABS
1.0000 | ORAL_TABLET | Freq: Once | ORAL | Status: AC
  Administered 2012-09-03: 1 via ORAL
  Filled 2012-09-03: qty 1

## 2012-09-03 MED ORDER — CYCLOBENZAPRINE HCL 10 MG PO TABS: 10.0000 mg | ORAL_TABLET | Freq: Two times a day (BID) | ORAL | Status: DC | PRN

## 2012-09-03 MED ORDER — HYDROCODONE-ACETAMINOPHEN 5-500 MG PO TABS: 1.0000 | ORAL_TABLET | Freq: Four times a day (QID) | ORAL | Status: DC | PRN

## 2012-09-03 NOTE — ED Provider Notes (Signed)
History     CSN: [id number]  Arrival date & time 09/03/12  1215   First MD Initiated Contact with Patient 09/03/12 1405      Chief Complaint  Patient presents with  . Back Pain    (Consider location/radiation/quality/duration/timing/severity/associated sxs/prior treatment) HPI LYNARD POSTLEWAIT is a 32 y.o. male who presents with complaint of back pain. Pt states pain began several weeks ago, after he has fallen down. Pt states ha has had back problems before, but worsened since. States fell backwards onto his lower back. States pain in lower back, radiating into left left. Denies numbness or weakness in left leg or foot. No loss of bowels or urinary incontinence or retention. Denies abdominal pain. No fever. Taking ibuprofen with no relief. No PCP or back specialist.   Past Medical History  Diagnosis Date  . Asthma   . Hypertension     History reviewed. No pertinent past surgical history.  History reviewed. No pertinent family history.  History  Substance Use Topics  . Smoking status: Never Smoker   . Smokeless tobacco: Not on file  . Alcohol Use: No      Review of Systems  Constitutional: Negative for fever and chills.  HENT: Negative for neck pain and neck stiffness.   Respiratory: Negative.   Musculoskeletal: Positive for back pain and gait problem.  Neurological: Negative for weakness and numbness.  All other systems reviewed and are negative.    Allergies  Review of patient's allergies indicates no known allergies.  Home Medications  No current outpatient prescriptions on file.  BP 175/105  Pulse 70  Temp(Src) 98.5 F (36.9 C) (Oral)  Resp 20  SpO2 98%  Physical Exam  Nursing note and vitals reviewed. Constitutional: He is oriented to person, place, and time. He appears well-developed and well-nourished. No distress.  Neck: Neck supple.  Cardiovascular: Normal rate, regular rhythm and normal heart sounds.   Pulmonary/Chest: Effort normal and breath  sounds normal. No respiratory distress. He has no wheezes. He has no rales.  Musculoskeletal:  Normal appearing lumbar spine. Tender over midline lumbar spine and left paravertebral area. Pain with left straight leg raise.   Neurological: He is alert and oriented to person, place, and time.  5/5 and equal lower extremity strength bilaterally. Equal grip strength bilaterally.  Patellar reflexes 2+ bilaterally. Normal sensation over extremities.   Skin: Skin is warm and dry.    ED Course  Procedures (including critical care time)  Labs Reviewed - No data to display Dg Lumbar Spine Complete  09/03/2012  *RADIOLOGY REPORT*  Clinical Data: Back pain, pain and numbness down left leg, slipped and fell on 08/21/2012  LUMBAR SPINE - COMPLETE 4+ VIEW  Comparison: None Correlation:  CT abdomen pelvis 03/16/2009  Findings: Five non-rib bearing lumbar vertebrae. Osseous mineralization normal. Straightening of lumbar lordosis. Minimal anterior wedging at L1 unchanged since prior CT, question developmental. Vertebral body and disc space heights otherwise maintained. No acute fracture, subluxation, bone destruction or spondylolysis. SI joints symmetric.  IMPRESSION: Straightening of lumbar lordosis. Otherwise negative exam.   Original Report Authenticated By: Ulyses Southward, M.D.      1. Lumbosacral radiculopathy       MDM  Pt with lower back pain, worse after a fall, radiating into left leg. PT neurovascularly intact. No red flags to suggest cauda equina. X-ray negative. No indication for emergent MRI. Will refer to orthopedics. Suspect sciatica vs herniated disk. Will try steroids, pain medications, muscle relaxants.  Lottie Mussel, PA-C 09/03/12 1606

## 2012-09-03 NOTE — ED Notes (Addendum)
Pt reports back pain since last Wednesday. Described pain as tightness with radiation to LLE. No incontinence, numbness, tingling. States slip and fall several weeks ago but did not seek treatment at the time.

## 2012-09-03 NOTE — ED Notes (Signed)
Patient transported to X-ray 

## 2012-09-08 NOTE — ED Provider Notes (Signed)
Medical screening examination/treatment/procedure(s) were performed by non-physician practitioner and as supervising physician I was immediately available for consultation/collaboration.   Suzi Roots, MD 09/08/12 [phone]

## 2013-04-20 ENCOUNTER — Encounter (HOSPITAL_COMMUNITY): Payer: Self-pay | Admitting: Emergency Medicine

## 2013-04-20 ENCOUNTER — Emergency Department (HOSPITAL_COMMUNITY): Payer: Medicaid Other

## 2013-04-20 ENCOUNTER — Observation Stay (HOSPITAL_COMMUNITY)
Admission: EM | Admit: 2013-04-20 | Discharge: 2013-04-21 | Disposition: A | Discharge status: Home / Self Care | Payer: Medicaid Other | Attending: Internal Medicine | Admitting: Internal Medicine

## 2013-04-20 DIAGNOSIS — E669 Obesity, unspecified: Secondary | ICD-10-CM

## 2013-04-20 DIAGNOSIS — J45901 Unspecified asthma with (acute) exacerbation: Secondary | ICD-10-CM | POA: Insufficient documentation

## 2013-04-20 DIAGNOSIS — IMO0002 Reserved for concepts with insufficient information to code with codable children: Secondary | ICD-10-CM | POA: Insufficient documentation

## 2013-04-20 DIAGNOSIS — R079 Chest pain, unspecified: Secondary | ICD-10-CM

## 2013-04-20 DIAGNOSIS — I10 Essential (primary) hypertension: Secondary | ICD-10-CM

## 2013-04-20 DIAGNOSIS — R9431 Abnormal electrocardiogram [ECG] [EKG]: Secondary | ICD-10-CM

## 2013-04-20 DIAGNOSIS — R0789 Other chest pain: Principal | ICD-10-CM | POA: Insufficient documentation

## 2013-04-20 DIAGNOSIS — M109 Gout, unspecified: Secondary | ICD-10-CM

## 2013-04-20 DIAGNOSIS — E785 Hyperlipidemia, unspecified: Secondary | ICD-10-CM

## 2013-04-20 DIAGNOSIS — M25579 Pain in unspecified ankle and joints of unspecified foot: Secondary | ICD-10-CM | POA: Insufficient documentation

## 2013-04-20 LAB — CBC
HCT: 44.8 % (ref 39.0–52.0)
Hemoglobin: 15 g/dL (ref 13.0–17.0)
MCV: 83.3 fL (ref 78.0–100.0)
WBC: 13.9 10*3/uL — ABNORMAL HIGH (ref 4.0–10.5)

## 2013-04-20 LAB — HEMOGLOBIN A1C
Hgb A1c MFr Bld: 6.2 % — ABNORMAL HIGH (ref <5.7)
Mean Plasma Glucose: 131 mg/dL — ABNORMAL HIGH (ref <117)

## 2013-04-20 LAB — TROPONIN I: Troponin I: <0.3 ng/mL (ref <0.30)

## 2013-04-20 LAB — BASIC METABOLIC PANEL
BUN: 11 mg/dL (ref 6–23)
CO2: 23 mEq/L (ref 19–32)
Chloride: 103 mEq/L (ref 96–112)
GFR calc Af Amer: >90 mL/min (ref >90)
Glucose, Bld: 105 mg/dL — ABNORMAL HIGH (ref 70–99)
Potassium: 4.2 mEq/L (ref 3.5–5.1)

## 2013-04-20 LAB — URIC ACID: Uric Acid, Serum: 8.6 mg/dL — ABNORMAL HIGH (ref 4.0–7.8)

## 2013-04-20 LAB — POCT I-STAT TROPONIN I

## 2013-04-20 MED ORDER — INFLUENZA VAC SPLIT QUAD 0.5 ML IM SUSP
0.5000 mL | INTRAMUSCULAR | Status: DC
  Filled 2013-04-20: qty 0.5

## 2013-04-20 MED ORDER — ENOXAPARIN SODIUM 40 MG/0.4ML ~~LOC~~ SOLN
40.0000 mg | SUBCUTANEOUS | Status: DC
  Administered 2013-04-20: 40 mg via SUBCUTANEOUS
  Filled 2013-04-20 (×2): qty 0.4

## 2013-04-20 MED ORDER — ALBUTEROL SULFATE (5 MG/ML) 0.5% IN NEBU: 2.5000 mg | INHALATION_SOLUTION | Freq: Four times a day (QID) | RESPIRATORY_TRACT | Status: DC | PRN

## 2013-04-20 MED ORDER — ASPIRIN 81 MG PO CHEW
324.0000 mg | CHEWABLE_TABLET | Freq: Once | ORAL | Status: AC
  Administered 2013-04-20: 324 mg via ORAL
  Filled 2013-04-20: qty 4

## 2013-04-20 MED ORDER — METOPROLOL SUCCINATE ER 25 MG PO TB24
25.0000 mg | ORAL_TABLET | Freq: Every day | ORAL | Status: DC
  Administered 2013-04-20: 25 mg via ORAL
  Filled 2013-04-20 (×2): qty 1

## 2013-04-20 MED ORDER — ENOXAPARIN SODIUM 40 MG/0.4ML ~~LOC~~ SOLN: 40.0000 mg | SUBCUTANEOUS | Status: DC

## 2013-04-20 MED ORDER — ACETAMINOPHEN 325 MG PO TABS: 650.0000 mg | ORAL_TABLET | ORAL | Status: DC | PRN

## 2013-04-20 MED ORDER — HYDRALAZINE HCL 20 MG/ML IJ SOLN
10.0000 mg | Freq: Three times a day (TID) | INTRAMUSCULAR | Status: DC | PRN
  Administered 2013-04-20: 10 mg via INTRAVENOUS
  Filled 2013-04-20: qty 1

## 2013-04-20 MED ORDER — ONDANSETRON HCL 4 MG/2ML IJ SOLN: 4.0000 mg | Freq: Four times a day (QID) | INTRAMUSCULAR | Status: DC | PRN

## 2013-04-20 MED ORDER — PANTOPRAZOLE SODIUM 40 MG PO TBEC
40.0000 mg | DELAYED_RELEASE_TABLET | Freq: Two times a day (BID) | ORAL | Status: DC
  Administered 2013-04-20: 40 mg via ORAL
  Filled 2013-04-20 (×2): qty 1

## 2013-04-20 MED ORDER — GI COCKTAIL ~~LOC~~: 30.0000 mL | Freq: Four times a day (QID) | ORAL | Status: DC | PRN

## 2013-04-20 MED ORDER — NAPROXEN 500 MG PO TABS
500.0000 mg | ORAL_TABLET | Freq: Two times a day (BID) | ORAL | Status: DC
  Administered 2013-04-20: 500 mg via ORAL
  Filled 2013-04-20 (×4): qty 1

## 2013-04-20 MED ORDER — NITROGLYCERIN 0.4 MG SL SUBL
0.4000 mg | SUBLINGUAL_TABLET | SUBLINGUAL | Status: DC | PRN
  Administered 2013-04-20 (×2): 0.4 mg via SUBLINGUAL

## 2013-04-20 MED ORDER — COLCHICINE 0.6 MG PO TABS
0.6000 mg | ORAL_TABLET | Freq: Every day | ORAL | Status: DC
  Administered 2013-04-20: 0.6 mg via ORAL
  Filled 2013-04-20 (×2): qty 1

## 2013-04-20 MED ORDER — MORPHINE SULFATE 2 MG/ML IJ SOLN: 1.0000 mg | INTRAMUSCULAR | Status: DC | PRN

## 2013-04-20 NOTE — Discharge Planning (Signed)
[id number]Samuel Decker, Community Liaison  Patient is to follow up with the [facility] on 05/13/13 at 11:00am to establish primary care. Patient will be obtaining the orange card as well. Patient is aware of this appointment and was given the application with my contact information for any questions or concerns.

## 2013-04-20 NOTE — ED Notes (Signed)
MD at bedside. 

## 2013-04-20 NOTE — H&P (Signed)
[facility] History and Physical  Samuel Decker OZH:[id number] DOB: 1981-06-12 DOA: 04/20/2013  Referring physician: Dr Rhunette Croft PCP: Default, Provider, MD  Specialists: [facility] cardiology.   Chief Complaint: Chest pain, ankle pain.   HPI: Samuel Decker is a 32 y.o. male with PMH significant for hypertension, hyperlipidemia, left ventricular hypertrophy, not taking any medications, does not have PCP who presents to the ED complaining of chest pain and right ankle pain.  Patient relates chest pain, sharp in quality, 710 in intensity, on the left  side of his chest and middle chest. Is worse when he sit up or moves. He has had this chest pain for last couple of months. He relates chest pain and dyspnea on exertion.  He is also complaining of right ankle pain that started night prior to admission. He has prior history of gout on his right big toe. He denies fever, chills, nausea or vomiting. He also relates pain radiates to his leg, relates numbness legs for months.   Review of Systems: Negative except as per HPI.   Past Medical History  Diagnosis Date  . Asthma   . Hypertension    History reviewed. No pertinent past surgical history. Social History:  reports that he has never smoked. He does not have any smokeless tobacco history on file. He reports that he does not drink alcohol. His drug history is not on file. he is married. He has one son. 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    No Known Allergies  Family history: father diagnosed with heart diseases at 72 Y/O. HTN and diabetes runs in his family.   Prior to Admission medications   Medication Sig Start Date End Date Taking? Authorizing Provider  predniSONE (DELTASONE) 20 MG tablet Take 20 mg by mouth daily.   Yes Historical Provider, MD   Physical Exam: Filed Vitals:   04/20/13 1516  BP: 137/51  Pulse: 96  Temp:   Resp: 18    General Appearance:    Alert, cooperative, no distress, appears stated age  Head:     Normocephalic, without obvious abnormality, atraumatic  Eyes:    PERRL, conjunctiva/corneas clear, EOM's intact       Ears:    Normal TM's and external ear canals, both ears  Nose:   Nares normal, septum midline, mucosa normal, no drainage    or sinus tenderness  Throat:   Lips, mucosa, and tongue normal; teeth and gums normal  Neck:   Supple, symmetrical, trachea midline, no adenopathy;       thyroid:  No enlargement/tenderness/nodules; no carotid   bruit or JVD  Back:     Symmetric, no curvature, ROM normal, no CVA tenderness  Lungs:     Clear to auscultation bilaterally, respirations unlabored  Chest wall:    Chest wall tenderness on palpation.   Heart:    Regular rate and rhythm, Distant heart sounds, S1 and S2 normal.  Abdomen:     Soft, non-tender, bowel sounds active all four quadrants,    no masses, no organomegaly        Extremities:   Left LE  normal, atraumatic, no cyanosis or edema, right ankle with mild swelling, no redness, no warm to palpation.   Pulses:   2+ and symmetric all extremities  Skin:   Skin color, texture, turgor normal, no rashes or lesions  Lymph nodes:   Cervical, supraclavicular, and axillary nodes normal  Neurologic:   CNII-XII intact. Normal strength, sensation and reflexes  throughout   Labs on Admission:  Basic Metabolic Panel:  Recent Labs Lab 04/20/13 1415  NA 136  K 4.2  CL 103  CO2 23  GLUCOSE 105*  BUN 11  CREATININE 1.06  CALCIUM 9.4   CBC:  Recent Labs Lab 04/20/13 1415  WBC 13.9*  HGB 15.0  HCT 44.8  MCV 83.3  PLT 336   Cardiac Enzymes: No results found for this basename: CKTOTAL, CKMB, CKMBINDEX, TROPONINI,  in the last 168 hours  BNP (last 3 results)  Recent Labs  04/20/13 1415  PROBNP 143.6*   CBG: No results found for this basename: GLUCAP,  in the last 168 hours  Radiological Exams on Admission: Dg Chest Port 1 View  04/20/2013   CLINICAL DATA:  Chest tightness and shortness of breath  EXAM:  PORTABLE CHEST - 1 VIEW  COMPARISON:  Chest CT 05/05/2010  FINDINGS: Exam is lordotic. Normal mediastinum and cardiac silhouette. Normal pulmonary vasculature. No evidence of effusion, infiltrate, or pneumothorax. No acute bony abnormality.  IMPRESSION: No acute cardiopulmonary process.   Electronically Signed   By: Genevive Bi M.D.   On: 04/20/2013 15:03    EKG: Independently reviewed. Old T wave inversion leads II, III, V 4, V 5, V 5 .   Assessment/Plan Active Problems:   HYPERLIPIDEMIA   HYPERTENSION   CHEST PAIN UNSPECIFIED   ELECTROCARDIOGRAM, ABNORMAL  1-Chest pain: with atypical symptoms for ACS, but with prior history of left ventricular hypertrophic, hypertension , not complaint with medications, and chest pain/dyspnea on exertion will admit to rule out ACS. D dimer negative. EKG with old T wave depression leads 2, 3, V 4, V 5, V 6, first set of cardiac enzymes negative. 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  -Cycle cardiac enzymes, check ECHO,Check UDS. Start NSAID, Protonix, prn nitroglycerin.   2-Right Ankle pain:  With prior history of gout, this is likely primary diagnosis. Will start naproxen, colchicine. No evidence of infection. Check uric acid.   3-Hypertension: will start metoprolol.   4-hyperlipidemia: check lipid panel.      Code Status: Full Code.  Family Communication: Care discussed with patient and sister at bedside.  Disposition Plan: admit under observation.   Time spent: 65 minutes.   REGALADO,BELKYS [facility] Pager [phone]  If 7PM-7AM, please contact night-coverage [email or web address] Password [email or web address] 04/20/2013, 5:00 PM

## 2013-04-20 NOTE — ED Notes (Signed)
Pt c/o Left side chest tightness radiating into mid-sternum chest and through to his back and SOB. Pt also reports Right ankle pain starting last night, increase pain when walking and feels like he has fluid on his ankle

## 2013-04-20 NOTE — ED Provider Notes (Signed)
CSN: [id number]     Arrival date & time 04/20/13  1344 History   First MD Initiated Contact with Patient 04/20/13 1405     Chief Complaint  Patient presents with  . Chest Pain  . Ankle Pain   (Consider location/radiation/quality/duration/timing/severity/associated sxs/prior Treatment) HPI Comments: Pt comes in with cc of chest tightness. Pt has hx of HTN, with non compliance. States that he has been having left sided chest pain, radiating to the back and some associated dib. The pain has been intermittent for the past few weeks and is not pleuritic, or exertional, nor does it have any specific provoking factors. There is hx of MPI in 2--5, that was neg. Pt has premature CAD in the family, father at age 69. He is not taking his BP meds, as he has no PCP. Additionally, he has tight ankle pain starting last night. No hx of DVT, PE and no risk factors for the same.  Patient is a 32 y.o. male presenting with chest pain and ankle pain. The history is provided by the patient.  Chest Pain Associated symptoms: shortness of breath   Associated symptoms: no abdominal pain and no cough   Ankle Pain   Past Medical History  Diagnosis Date  . Asthma   . Hypertension    History reviewed. No pertinent past surgical history. History reviewed. No pertinent family history. History  Substance Use Topics  . Smoking status: Never Smoker   . Smokeless tobacco: Not on file  . Alcohol Use: No    Review of Systems  Constitutional: Negative for activity change and appetite change.  Respiratory: Positive for shortness of breath. Negative for cough.   Cardiovascular: Positive for chest pain. Negative for leg swelling.  Gastrointestinal: Negative for abdominal pain.  Genitourinary: Negative for dysuria.    Allergies  Review of patient's allergies indicates no known allergies.  Home Medications   Current Outpatient Rx  Name  Route  Sig  Dispense  Refill  . predniSONE (DELTASONE) 20 MG tablet    Oral   Take 20 mg by mouth daily.          BP 137/51  Pulse 96  Temp(Src) 98.4 F (36.9 C) (Oral)  Resp 18  Ht 6\' 1"  (1.854 m)  Wt 339 lb 1.6 oz (153.815 kg)  BMI 44.75 kg/m2  SpO2 99% Physical Exam  Nursing note and vitals reviewed. Constitutional: He is oriented to person, place, and time. He appears well-developed.  HENT:  Head: Normocephalic and atraumatic.  Eyes: Conjunctivae and EOM are normal. Pupils are equal, round, and reactive to light.  Neck: Normal range of motion. Neck supple.  Cardiovascular: Normal rate and regular rhythm.   Pulmonary/Chest: Effort normal and breath sounds normal.  Abdominal: Soft. Bowel sounds are normal. He exhibits no distension. There is no tenderness. There is no rebound and no guarding.  Musculoskeletal: He exhibits no edema and no tenderness.  Neurological: He is alert and oriented to person, place, and time.  Skin: Skin is warm.    ED Course  Procedures (including critical care time) Labs Review Labs Reviewed  CBC - Abnormal; Notable for the following:    WBC 13.9 (*)    All other components within normal limits  BASIC METABOLIC PANEL - Abnormal; Notable for the following:    Glucose, Bld 105 (*)    All other components within normal limits  PRO B NATRIURETIC PEPTIDE - Abnormal; Notable for the following:    Pro B Natriuretic peptide (BNP) 143.6 (*)  All other components within normal limits  D-DIMER, QUANTITATIVE  POCT I-STAT TROPONIN I   Imaging Review Dg Chest Port 1 View  04/20/2013   CLINICAL DATA:  Chest tightness and shortness of breath  EXAM: PORTABLE CHEST - 1 VIEW  COMPARISON:  Chest CT 05/05/2010  FINDINGS: Exam is lordotic. Normal mediastinum and cardiac silhouette. Normal pulmonary vasculature. No evidence of effusion, infiltrate, or pneumothorax. No acute bony abnormality.  IMPRESSION: No acute cardiopulmonary process.   Electronically Signed   By: Genevive Bi M.D.   On: 04/20/2013 15:03    EKG  Interpretation     Ventricular Rate:  92 PR Interval:  152 QRS Duration: 96 QT Interval:  376 QTC Calculation: 464 R Axis:   48 Text Interpretation:  Normal sinus rhythm ST \\T \ T wave abnormality, consider inferolateral ischemia Prolonged QT Abnormal ECG t wave inversion in inferiolateral leads            MDM  No diagnosis found.  Differential diagnosis includes: ACS syndrome Myocarditis Pericarditis Pericardial effusion Pleural effusion PE Musculoskeletal pain Dissection  Pt comes in with cc of chest pain. Pt's pain is typical in that it is left sided pressure like pain with radiation to the back. His risk factors are HTN, Premature cardiac dz in family. EKG has t wave inversions - unchanged.  Admitted to Hospitalist for ACS workup.  Derwood Kaplan, MD 04/20/13 1644

## 2013-04-21 DIAGNOSIS — E669 Obesity, unspecified: Secondary | ICD-10-CM

## 2013-04-21 LAB — TROPONIN I
Troponin I: <0.3 ng/mL (ref <0.30)
Troponin I: <0.3 ng/mL (ref <0.30)

## 2013-04-21 LAB — LIPID PANEL
HDL: 27 mg/dL — ABNORMAL LOW (ref >39)
LDL Cholesterol: 108 mg/dL — ABNORMAL HIGH (ref 0–99)
Total CHOL/HDL Ratio: 6.7 RATIO

## 2013-04-21 MED ORDER — IBUPROFEN 200 MG PO TABS: 400.0000 mg | ORAL_TABLET | Freq: Three times a day (TID) | ORAL | Status: DC

## 2013-04-21 MED ORDER — METOPROLOL TARTRATE 50 MG PO TABS: 50.0000 mg | ORAL_TABLET | Freq: Two times a day (BID) | ORAL | Status: DC

## 2013-04-21 NOTE — Progress Notes (Signed)
Reviewed discharge instructions with patient and he stated his understanding.  Instructed patient to get prescriptions as soon as possible this morning to take his morning dose of metoprolol.  Patient discharged home via wheelchair by volunteers.  Patient refused his flu vaccine this am, prior to discharge.  Colman Cater

## 2013-04-21 NOTE — Discharge Summary (Signed)
Physician Discharge Summary  Samuel Decker XBJ:[id number] DOB: [birth year] DOA: 04/20/2013  PCP: Default, Provider, MD  Admit date: 04/20/2013 Discharge date: 04/21/2013  Discharge Diagnoses:  Musculoskeletal chest pain Acute doubt of right ankle   HYPERLIPIDEMIA   HYPERTENSION   ELECTROCARDIOGRAM, ABNORMAL   Discharge Condition: stable  Filed Weights   04/20/13 1353  Weight: 153.815 kg (339 lb 1.6 oz)    History of present illness:  32 y.o. male with PMH significant for hypertension, hyperlipidemia, left ventricular hypertrophy, not taking any medications, does not have PCP who presents to the ED complaining of chest pain and right ankle pain.  Patient relates chest pain, sharp in quality, 710 in intensity, on the left side of his chest and middle chest. Is worse when he sit up or moves. He has had this chest pain for last couple of months. He relates chest pain and dyspnea on exertion.  He is also complaining of right ankle pain that started night prior to admission. He has prior history of gout on his right big toe. He denies fever, chills, nausea or vomiting. He also relates pain radiates to his leg, relates numbness legs for months  Hospital Course:  Chest pain was atypical and reproducible. Patient was admitted for observation. MI ruled out. D-dimer normal.  patient's ankle pain was felt to be gout related and started on nonsteroidal anti-inflammatories and colchicine.patient was started on metoprolol for untreated hypertension. Patient will followup with [facility].  Patient's symptoms were much improved at the time of discharge.  with respect to his hyperlipidemia, the decision to start statin will be deferred to his primary care provider.  Procedures:  none  Consultations:  none  Discharge Exam: Filed Vitals:   04/21/13 0800  BP: 152/87  Pulse: 72  Temp: 97.7 F (36.5 C)  Resp: 16    General: comfortable Cardiovascular: regular  rate rhythm without murmurs gallops rubs Respiratory: clear to auscultation bilaterally without wheezes rhonchi or rales Extremities: No clubbing cyanosis or edema  Discharge Instructions  Discharge Orders   Future Appointments Provider Department Dept Phone   05/13/2013 11:00 AM [facility] Covering Provider 2 [facility] [phone]   05/13/2013 12:00 PM [facility] Financial Counselor [place] [facility] [phone]   Future Orders Complete By Expires   Diet - low sodium heart healthy  As directed        Medication List    STOP taking these medications       predniSONE 20 MG tablet  Commonly known as:  DELTASONE      TAKE these medications       ibuprofen 200 MG tablet  Commonly known as:  ADVIL,MOTRIN  Take 2 tablets (400 mg total) by mouth 3 (three) times daily with meals. For 3 days     metoprolol 50 MG tablet  Commonly known as:  LOPRESSOR  Take 1 tablet (50 mg total) by mouth 2 (two) times daily.       No Known Allergies     Follow-up Information   Follow up with [facility].   Contact information:   [address] [place] [address] [phone]       The results of significant diagnostics from this hospitalization (including imaging, microbiology, ancillary and laboratory) are listed below for reference.    Significant Diagnostic Studies: Dg Chest Port 1 View  04/20/2013   CLINICAL DATA:  Chest tightness and shortness of breath  EXAM: PORTABLE CHEST - 1 VIEW  COMPARISON:  Chest CT 05/05/2010  FINDINGS: Exam is lordotic. Normal mediastinum and cardiac silhouette. Normal pulmonary vasculature. No evidence of effusion, infiltrate, or pneumothorax. No acute bony abnormality.  IMPRESSION: No acute cardiopulmonary process.   Electronically Signed   By: Genevive Bi M.D.   On: 04/20/2013 15:03    Microbiology: No results found for this or any previous visit (from the  past 240 hour(s)).   Labs: Basic Metabolic Panel:  Recent Labs Lab 04/20/13 1415  NA 136  K 4.2  CL 103  CO2 23  GLUCOSE 105*  BUN 11  CREATININE 1.06  CALCIUM 9.4   Liver Function Tests: No results found for this basename: AST, ALT, ALKPHOS, BILITOT, PROT, ALBUMIN,  in the last 168 hours No results found for this basename: LIPASE, AMYLASE,  in the last 168 hours No results found for this basename: AMMONIA,  in the last 168 hours CBC:  Recent Labs Lab 04/20/13 1415  WBC 13.9*  HGB 15.0  HCT 44.8  MCV 83.3  PLT 336   Cardiac Enzymes:  Recent Labs Lab 04/20/13 1716 04/20/13 2350 04/21/13 0400  TROPONINI <0.30 <0.30 <0.30   BNP: BNP (last 3 results)  Recent Labs  04/20/13 1415  PROBNP 143.6*   CBG: No results found for this basename: GLUCAP,  in the last 168 hours  EKG Normal sinus rhythm ST & T wave abnormality, consider inferolateral ischemia Prolonged QT  Signed:  Quinne Pires L  [facility] 04/21/2013, 9:51 AM

## 2013-04-21 NOTE — Care Management Note (Signed)
    Page 1 of 1   04/21/2013     9:55:18 AM   CARE MANAGEMENT NOTE 04/21/2013  Patient:  Samuel Decker, Samuel Decker   Account Number:  [id number]  Date Initiated:  04/21/2013  Documentation initiated by:  GRAVES-BIGELOW,Gabriel Conry  Subjective/Objective Assessment:   Pt admitted with cp and plans to d/c today.     Action/Plan:   CM did call the financial counselor and appointment has been made for the [facility] for 05-13-13 for  MD f/u and then an appointment for the orange card. No further needs from CM at this time.   Anticipated DC Date:  04/21/2013   Anticipated DC Plan:  HOME/SELF CARE      DC Planning Services  CM consult  Follow-up appt scheduled  PCP issues  [facility]      Choice offered to / List presented to:             Status of service:  Completed, signed off Medicare Important Message given?   (If response is "NO", the following Medicare IM given date fields will be blank) Date Medicare IM given:   Date Additional Medicare IM given:    Discharge Disposition:  HOME/SELF CARE  Per UR Regulation:  Reviewed for med. necessity/level of care/duration of stay  If discussed at Long Length of Stay Meetings, dates discussed:    Comments:

## 2013-05-13 ENCOUNTER — Ambulatory Visit: Payer: Medicaid Other

## 2013-05-13 ENCOUNTER — Encounter: Payer: Self-pay | Admitting: Internal Medicine

## 2013-05-13 ENCOUNTER — Ambulatory Visit: Payer: Medicaid Other | Attending: Internal Medicine | Admitting: Internal Medicine

## 2013-05-13 VITALS — BP 153/107 | HR 63 | Temp 98.6°F | Resp 16 | Ht 73.0 in | Wt 344.0 lb

## 2013-05-13 DIAGNOSIS — M109 Gout, unspecified: Secondary | ICD-10-CM

## 2013-05-13 DIAGNOSIS — I10 Essential (primary) hypertension: Secondary | ICD-10-CM

## 2013-05-13 DIAGNOSIS — E785 Hyperlipidemia, unspecified: Secondary | ICD-10-CM

## 2013-05-13 MED ORDER — AMLODIPINE BESYLATE 5 MG PO TABS: 5.0000 mg | ORAL_TABLET | Freq: Every day | ORAL | Status: DC

## 2013-05-13 MED ORDER — ALLOPURINOL 100 MG PO TABS: 100.0000 mg | ORAL_TABLET | Freq: Every day | ORAL | Status: DC

## 2013-05-13 MED ORDER — METOPROLOL TARTRATE 50 MG PO TABS: 50.0000 mg | ORAL_TABLET | Freq: Two times a day (BID) | ORAL | Status: DC

## 2013-05-13 NOTE — Patient Instructions (Signed)
Obesity Obesity is defined as having too much total body fat and a body mass index (BMI) of 30 or more. BMI is an estimate of body fat and is calculated from your height and weight. Obesity happens when you consume more calories than you can burn by exercising or performing daily physical tasks. Prolonged obesity can cause major illnesses or emergencies, such as:   A stroke.  Heart disease.  Diabetes.  Cancer.  Arthritis.  High blood pressure (hypertension).  High cholesterol.  Sleep apnea.  Erectile dysfunction.  Infertility problems. CAUSES   Regularly eating unhealthy foods.  Physical inactivity.  Certain disorders, such as an underactive thyroid (hypothyroidism), Cushing's syndrome, and polycystic ovarian syndrome.  Certain medicines, such as steroids, some depression medicines, and antipsychotics.  Genetics.  Lack of sleep. DIAGNOSIS  A caregiver can diagnose obesity after calculating your BMI. Obesity will be diagnosed if your BMI is 30 or higher.  There are other methods of measuring obesity levels. Some other methods include measuring your skin fold thickness, your waist circumference, and comparing your hip circumference to your waist circumference. TREATMENT  A healthy treatment program includes some or all of the following:  Long-term dietary changes.  Exercise and physical activity.  Behavioral and lifestyle changes.  Medicine only under the supervision of your caregiver. Medicines may help, but only if they are used with diet and exercise programs. An unhealthy treatment program includes:  Fasting.  Fad diets.  Supplements and drugs. These choices do not succeed in long-term weight control.  HOME CARE INSTRUCTIONS   Exercise and perform physical activity as directed by your caregiver. To increase physical activity, try the following:  Use stairs instead of elevators.  Park farther away from store entrances.  Garden, bike, or walk instead of  watching television or using the computer.  Eat healthy, low-calorie foods and drinks on a regular basis. Eat more fruits and vegetables. Use low-calorie cookbooks or take healthy cooking classes.  Limit fast food, sweets, and processed snack foods.  Eat smaller portions.  Keep a daily journal of everything you eat. There are many free websites to help you with this. It may be helpful to measure your foods so you can determine if you are eating the correct portion sizes.  Avoid drinking alcohol. Drink more water and drinks without calories.  Take vitamins and supplements only as recommended by your caregiver.  Weight-loss support groups, Registered Dieticians, counselors, and stress reduction education can also be very helpful. SEEK IMMEDIATE MEDICAL CARE IF:  You have chest pain or tightness.  You have trouble breathing or feel short of breath.  You have weakness or leg numbness.  You feel confused or have trouble talking.  You have sudden changes in your vision. MAKE SURE YOU:  Understand these instructions.  Will watch your condition.  Will get help right away if you are not doing well or get worse. Document Released: 07/26/2004 Document Revised: 12/18/2011 Document Reviewed: 07/25/2011 Exit[employer] Patient Information 2014 [employer].  

## 2013-05-13 NOTE — Progress Notes (Signed)
HFU Pt was admitted to the hospital with chest pain and gout flare. Today pt sates that the medication has controlled his chest pain and that he is feeling much better. No C.C. today

## 2013-05-13 NOTE — Progress Notes (Signed)
Patient Demographics  Samuel Decker, is a 32 y.o. male  YQI:[id number]  LOV:[id number]  DOB - 1980/10/31  Chief Complaint  Patient presents with  . Hospitalization Follow-up        Subjective:   Samuel Decker today is here to establish primary care. Patient is a 32 year old African American male with a past medical history of morbid obesity, hypertension who is here to followup after hospital discharge. Patient was recently admitted for chest pain and a gout flare. He is doing so much better, and does not have any further chest pain post discharge. He however did have a gout flare in his first metatarsal- phalangeal joint a few weeks ago that responded to ibuprofen.  Currently patient has no complaints. Patient has also has No headache, No chest pain, No abdominal pain,No Nausea, No new weakness tingling or numbness, No Cough or SOB.  Objective:    Filed Vitals:   05/13/13 1121  BP: 153/107  Pulse: 63  Temp: 98.6 F (37 C)  TempSrc: Oral  Resp: 16  Height: 6\' 1"  (1.854 m)  Weight: 344 lb (156.037 kg)  SpO2: 98%     ALLERGIES:  No Known Allergies  PAST MEDICAL HISTORY: Past Medical History  Diagnosis Date  . Asthma   . Hypertension     PAST SURGICAL HISTORY: History reviewed. No pertinent past surgical history.  FAMILY HISTORY: History reviewed. No pertinent family history.  MEDICATIONS AT HOME: Prior to Admission medications   Medication Sig Start Date End Date Taking? Authorizing Provider  ibuprofen (ADVIL,MOTRIN) 200 MG tablet Take 2 tablets (400 mg total) by mouth 3 (three) times daily with meals. For 3 days 04/21/13  Yes Christiane Ha, MD  metoprolol (LOPRESSOR) 50 MG tablet Take 1 tablet (50 mg total) by mouth 2 (two) times daily. 05/13/13  Yes Alvis Edgell Levora Dredge, MD  allopurinol (ZYLOPRIM) 100 MG tablet Take 1 tablet (100 mg total) by mouth daily. 05/13/13   Melannie Metzner Levora Dredge, MD  amLODipine (NORVASC) 5 MG tablet Take 1 tablet (5 mg total) by  mouth daily. 05/13/13   Mafalda Mcginniss Levora Dredge, MD    SOCIAL HISTORY:   reports that he has never smoked. He does not have any smokeless tobacco history on file. He reports that he does not drink alcohol or use illicit drugs.  REVIEW OF SYSTEMS:  Constitutional:   No   Fevers, chills, fatigue.  HEENT:    No headaches, Sore throat,   Cardio-vascular: No chest pain,  Orthopnea, swelling in lower extremities, anasarca, palpitations  GI:  No abdominal pain, nausea, vomiting, diarrhea  Resp: No shortness of breath,  No coughing up of blood.No cough.No wheezing.  Skin:  no rash or lesions.  GU:  no dysuria, change in color of urine, no urgency or frequency.  No flank pain.  Musculoskeletal: No joint pain or swelling.  No decreased range of motion.  No back pain.  Psych: No change in mood or affect. No depression or anxiety.  No memory loss.   Exam  General appearance :Awake, alert, not in any distress. Speech Clear. Not toxic Looking HEENT: Atraumatic and Normocephalic, pupils equally reactive to light and accomodation Neck: supple, no JVD. No cervical lymphadenopathy.  Chest:Good air entry bilaterally, no added sounds  CVS: S1 S2 regular, no murmurs.  Abdomen: Bowel sounds present, Non tender and not distended with no gaurding, rigidity or rebound. Extremities: B/L Lower Ext shows no edema, both legs are warm to touch Neurology: Awake alert, and oriented X 3,  CN II-XII intact, Non focal Skin:No Rash Wounds:N/A    Data Review   CBC No results found for this basename: WBC, HGB, HCT, PLT, MCV, MCH, MCHC, RDW, NEUTRABS, LYMPHSABS, MONOABS, EOSABS, BASOSABS, BANDABS, BANDSABD,  in the last 168 hours  Chemistries   No results found for this basename: NA, K, CL, CO2, GLUCOSE, BUN, CREATININE, GFRCGP, CALCIUM, MG, AST, ALT, ALKPHOS, BILITOT,  in the last 168  hours ------------------------------------------------------------------------------------------------------------------ No results found for this basename: HGBA1C,  in the last 72 hours ------------------------------------------------------------------------------------------------------------------ No results found for this basename: CHOL, HDL, LDLCALC, TRIG, CHOLHDL, LDLDIRECT,  in the last 72 hours ------------------------------------------------------------------------------------------------------------------ No results found for this basename: TSH, T4TOTAL, FREET3, T3FREE, THYROIDAB,  in the last 72 hours ------------------------------------------------------------------------------------------------------------------ No results found for this basename: VITAMINB12, FOLATE, FERRITIN, TIBC, IRON, RETICCTPCT,  in the last 72 hours  Coagulation profile  No results found for this basename: INR, PROTIME,  in the last 168 hours    Assessment & Plan   Hypertension - Continue with metoprolol 50 mg twice a day, add amlodipine 5 mg daily -reassess next visit  Gout -.Allopurinol 100 mg daily, slowly uptitrate in subsequent visits  Morbid obesity - Encouraged weight loss and exercise  A1c 6.2- will recheck in a couple of months, if still elevated will likely need metformin it would help him with weight loss as well  Hyperlipidemia - Recheck lipid panel in 2 months and-please follow up at next visit  Have ordered a repeat A1c, lipid panel and TSH to be done prior to next visit-please follow   Follow up in 2 months  The patient was given clear instructions to go to ER or return to medical center if symptoms don't improve, worsen or new problems develop. The patient verbalized understanding. The patient was told to call to get lab results if they haven't heard anything in the next week.

## 2013-06-01 ENCOUNTER — Ambulatory Visit: Payer: Medicaid Other

## 2013-08-13 ENCOUNTER — Ambulatory Visit: Payer: Medicaid Other | Admitting: Internal Medicine

## 2014-01-15 ENCOUNTER — Emergency Department (HOSPITAL_COMMUNITY): Payer: Self-pay

## 2014-01-15 ENCOUNTER — Emergency Department (HOSPITAL_COMMUNITY)
Admission: EM | Admit: 2014-01-15 | Discharge: 2014-01-15 | Disposition: A | Discharge status: Home / Self Care | Payer: Self-pay | Attending: Emergency Medicine | Admitting: Emergency Medicine

## 2014-01-15 ENCOUNTER — Encounter (HOSPITAL_COMMUNITY): Payer: Self-pay | Admitting: Emergency Medicine

## 2014-01-15 DIAGNOSIS — I10 Essential (primary) hypertension: Secondary | ICD-10-CM | POA: Insufficient documentation

## 2014-01-15 DIAGNOSIS — E041 Nontoxic single thyroid nodule: Secondary | ICD-10-CM | POA: Insufficient documentation

## 2014-01-15 DIAGNOSIS — R519 Headache, unspecified: Secondary | ICD-10-CM

## 2014-01-15 DIAGNOSIS — Z9114 Patient's other noncompliance with medication regimen: Secondary | ICD-10-CM

## 2014-01-15 DIAGNOSIS — J45909 Unspecified asthma, uncomplicated: Secondary | ICD-10-CM | POA: Insufficient documentation

## 2014-01-15 DIAGNOSIS — Z9119 Patient's noncompliance with other medical treatment and regimen: Secondary | ICD-10-CM | POA: Insufficient documentation

## 2014-01-15 DIAGNOSIS — Z91199 Patient's noncompliance with other medical treatment and regimen due to unspecified reason: Secondary | ICD-10-CM | POA: Insufficient documentation

## 2014-01-15 DIAGNOSIS — H18603 Keratoconus, unspecified, bilateral: Secondary | ICD-10-CM

## 2014-01-15 DIAGNOSIS — R51 Headache: Secondary | ICD-10-CM | POA: Insufficient documentation

## 2014-01-15 DIAGNOSIS — Z79899 Other long term (current) drug therapy: Secondary | ICD-10-CM | POA: Insufficient documentation

## 2014-01-15 DIAGNOSIS — H548 Legal blindness, as defined in USA: Secondary | ICD-10-CM | POA: Insufficient documentation

## 2014-01-15 DIAGNOSIS — R071 Chest pain on breathing: Secondary | ICD-10-CM | POA: Insufficient documentation

## 2014-01-15 DIAGNOSIS — H18609 Keratoconus, unspecified, unspecified eye: Secondary | ICD-10-CM | POA: Insufficient documentation

## 2014-01-15 DIAGNOSIS — R0789 Other chest pain: Secondary | ICD-10-CM

## 2014-01-15 DIAGNOSIS — R209 Unspecified disturbances of skin sensation: Secondary | ICD-10-CM | POA: Insufficient documentation

## 2014-01-15 HISTORY — DX: Legal blindness, as defined in USA: H54.8

## 2014-01-15 LAB — CBG MONITORING, ED: GLUCOSE-CAPILLARY: 87 mg/dL (ref 70–99)

## 2014-01-15 LAB — CBC WITH DIFFERENTIAL/PLATELET
Basophils Absolute: 0 10*3/uL (ref 0.0–0.1)
Basophils Relative: 0 % (ref 0–1)
EOS PCT: 3 % (ref 0–5)
Eosinophils Absolute: 0.2 10*3/uL (ref 0.0–0.7)
HCT: 45.2 % (ref 39.0–52.0)
Hemoglobin: 15 g/dL (ref 13.0–17.0)
LYMPHS PCT: 41 % (ref 12–46)
Lymphs Abs: 3.9 10*3/uL (ref 0.7–4.0)
MCH: 27.9 pg (ref 26.0–34.0)
MCHC: 33.2 g/dL (ref 30.0–36.0)
MCV: 84 fL (ref 78.0–100.0)
Monocytes Absolute: 0.5 10*3/uL (ref 0.1–1.0)
Monocytes Relative: 6 % (ref 3–12)
NEUTROS ABS: 4.9 10*3/uL (ref 1.7–7.7)
Neutrophils Relative %: 50 % (ref 43–77)
PLATELETS: 260 10*3/uL (ref 150–400)
RBC: 5.38 MIL/uL (ref 4.22–5.81)
RDW: 14.2 % (ref 11.5–15.5)
WBC: 9.5 10*3/uL (ref 4.0–10.5)

## 2014-01-15 LAB — BASIC METABOLIC PANEL
Anion gap: 16 — ABNORMAL HIGH (ref 5–15)
BUN: 11 mg/dL (ref 6–23)
CHLORIDE: 102 meq/L (ref 96–112)
CO2: 19 meq/L (ref 19–32)
Calcium: 8.9 mg/dL (ref 8.4–10.5)
Creatinine, Ser: 1.04 mg/dL (ref 0.50–1.35)
GFR calc Af Amer: >90 mL/min (ref >90)
GFR calc non Af Amer: >90 mL/min (ref >90)
Glucose, Bld: 85 mg/dL (ref 70–99)
POTASSIUM: 4.4 meq/L (ref 3.7–5.3)
Sodium: 137 mEq/L (ref 137–147)

## 2014-01-15 LAB — I-STAT TROPONIN, ED
Troponin i, poc: 0.03 ng/mL (ref 0.00–0.08)
Troponin i, poc: 0.02 ng/mL (ref 0.00–0.08)

## 2014-01-15 MED ORDER — ASPIRIN 81 MG PO CHEW
324.0000 mg | CHEWABLE_TABLET | Freq: Once | ORAL | Status: AC
  Administered 2014-01-15: 324 mg via ORAL
  Filled 2014-01-15: qty 4

## 2014-01-15 MED ORDER — DIAZEPAM 5 MG PO TABS
5.0000 mg | ORAL_TABLET | Freq: Once | ORAL | Status: AC
  Administered 2014-01-15: 5 mg via ORAL
  Filled 2014-01-15: qty 1

## 2014-01-15 MED ORDER — METOPROLOL TARTRATE 25 MG PO TABS
50.0000 mg | ORAL_TABLET | Freq: Once | ORAL | Status: AC
  Administered 2014-01-15: 50 mg via ORAL
  Filled 2014-01-15: qty 2

## 2014-01-15 MED ORDER — ONDANSETRON HCL 4 MG/2ML IJ SOLN
4.0000 mg | Freq: Once | INTRAMUSCULAR | Status: AC
  Administered 2014-01-15: 4 mg via INTRAVENOUS
  Filled 2014-01-15: qty 2

## 2014-01-15 MED ORDER — AMLODIPINE BESYLATE 5 MG PO TABS: 5.0000 mg | ORAL_TABLET | Freq: Every day | ORAL | Status: DC

## 2014-01-15 MED ORDER — DIPHENHYDRAMINE HCL 50 MG/ML IJ SOLN
25.0000 mg | Freq: Once | INTRAMUSCULAR | Status: AC
  Administered 2014-01-15: 25 mg via INTRAVENOUS
  Filled 2014-01-15: qty 1

## 2014-01-15 MED ORDER — AMLODIPINE BESYLATE 5 MG PO TABS
5.0000 mg | ORAL_TABLET | Freq: Once | ORAL | Status: AC
  Administered 2014-01-15: 5 mg via ORAL
  Filled 2014-01-15: qty 1

## 2014-01-15 MED ORDER — SODIUM CHLORIDE 0.9 % IV SOLN
Freq: Once | INTRAVENOUS | Status: AC
  Administered 2014-01-15: 15:00:00 via INTRAVENOUS

## 2014-01-15 MED ORDER — IOHEXOL 350 MG/ML SOLN
100.0000 mL | Freq: Once | INTRAVENOUS | Status: AC | PRN
  Administered 2014-01-15: 100 mL via INTRAVENOUS

## 2014-01-15 MED ORDER — METOPROLOL TARTRATE 50 MG PO TABS: 50.0000 mg | ORAL_TABLET | Freq: Two times a day (BID) | ORAL | Status: DC

## 2014-01-15 MED ORDER — MORPHINE SULFATE 4 MG/ML IJ SOLN
4.0000 mg | Freq: Once | INTRAMUSCULAR | Status: AC
  Administered 2014-01-15: 4 mg via INTRAVENOUS
  Filled 2014-01-15: qty 1

## 2014-01-15 MED ORDER — DIAZEPAM 5 MG PO TABS: 5.0000 mg | ORAL_TABLET | Freq: Two times a day (BID) | ORAL | Status: DC

## 2014-01-15 MED ORDER — NITROGLYCERIN 2 % TD OINT
1.0000 [in_us] | TOPICAL_OINTMENT | Freq: Once | TRANSDERMAL | Status: AC
  Administered 2014-01-15: 1 [in_us] via TOPICAL
  Filled 2014-01-15: qty 1

## 2014-01-15 MED ORDER — KETOROLAC TROMETHAMINE 30 MG/ML IJ SOLN
30.0000 mg | Freq: Once | INTRAMUSCULAR | Status: AC
  Administered 2014-01-15: 30 mg via INTRAVENOUS
  Filled 2014-01-15: qty 1

## 2014-01-15 NOTE — Progress Notes (Signed)
ED CM consulted by N. Pisciotta PA-Cconcerning follow up care. Pt presented to [facility] ED with c/o left-sided chest pain (nonradiating, described as tightness, rated at 6/10 associated with shortness of breath) and right hand pins and needles paresthesia onset yesterday. Noted in record patient is without  health insurance. Pt has established care with the [facility] Nov of 2014. Met with patient at bedside, confirmed information.  Pt reports being off all of his meds for one year because of not having insurance, and without PCP.  Made patient aware that he has established care with [facility] Dr. Annitta Needs is his PCP. Offered to contact clinic to f/u appt today.Patient agreeable,  Scheduled a f/u appt 7/22 at 2:45p with Dr. Verl Blalock for CP follow-up. Pt verbalized understanding and appreciation for the assistance. Provided the brochure with address and phone number Stressed the importance of keeping the appointment. Voiced understanding teach back done. Discussed with Monico Blitz PA-C and Sarah RN both agreeable to plan. No further ED CM needs identified.

## 2014-01-15 NOTE — Discharge Instructions (Signed)
For pain control you may take up to 800mg  of ibuprofen (that is usually 4 over the counter pills)  3 times a day (take with food) and acetaminophen 975mg  (this is 3 over the counter pills) four times a day. Do not drink alcohol or combine with other medications that have acetaminophen as an ingredient (Read the labels!).   Please tell your primary care doctor at that you have been diagnosed with possible thyroid nodule. You may need an ultrasound to further evaluate.  Take valium for breakthrough pain, do not drink alcohol, drive, care for children or do other critical tasks while taking valium.  Do not hesitate to return to the Emergency Department for any new, worsening or concerning symptoms.   Please follow with your primary care doctor in the next 2 days for a check-up. They must obtain records for further management.   Do not hesitate to return to the Emergency Department for any new, worsening or concerning symptoms.     Chest Wall Pain Chest wall pain is pain in or around the bones and muscles of your chest. It may take up to 6 weeks to get better. It may take longer if you must stay physically active in your work and activities.  CAUSES  Chest wall pain may happen on its own. However, it may be caused by:  A viral illness like the flu.  Injury.  Coughing.  Exercise.  Arthritis.  Fibromyalgia.  Shingles. HOME CARE INSTRUCTIONS   Avoid overtiring physical activity. Try not to strain or perform activities that cause pain. This includes any activities using your chest or your abdominal and side muscles, especially if heavy weights are used.  Put ice on the sore area.  Put ice in a plastic bag.  Place a towel between your skin and the bag.  Leave the ice on for 15-20 minutes per hour while awake for the first 2 days.  Only take over-the-counter or prescription medicines for pain, discomfort, or fever as directed by your caregiver. SEEK IMMEDIATE MEDICAL CARE IF:    Your pain increases, or you are very uncomfortable.  You have a fever.  Your chest pain becomes worse.  You have new, unexplained symptoms.  You have nausea or vomiting.  You feel sweaty or lightheaded.  You have a cough with phlegm (sputum), or you cough up blood. MAKE SURE YOU:   Understand these instructions.  Will watch your condition.  Will get help right away if you are not doing well or get worse. Document Released: 06/18/2005 Document Revised: 09/10/2011 Document Reviewed: 02/12/2011 [facility] Patient Information 2015 [employer], [place]. This information is not intended to replace advice given to you by your health care provider. Make sure you discuss any questions you have with your health care provider.

## 2014-01-15 NOTE — ED Notes (Signed)
Pt c/o mid sternal chest tightness some SOB and HA; pt sts recently trying new contacts unsure if involved

## 2014-01-15 NOTE — ED Notes (Signed)
Patient transported to CT 

## 2014-01-15 NOTE — ED Provider Notes (Signed)
CSN: [id number]     Arrival date & time 01/15/14  1156 History   First MD Initiated Contact with Patient 01/15/14 1203     Chief Complaint  Patient presents with  . Chest Pain     (Consider location/radiation/quality/duration/timing/severity/associated sxs/prior Treatment) HPI  Samuel Decker is a 33 y.o. male with past medical history significant for morbid obesity, hypertension, keratoconus, has been off all medications for one year because he is not being sure complaining of acute onset of blurred vision (patient is legally blind, has had contact since February he normally sees adequately with contacts in, but vision is significantly blurred even with contacts in), left-sided chest pain (nonradiating, described as tightness, rated at 6/10 associated with shortness of breath) and right hand pins and needles paresthesia onset yesterday. Patient denies headache, dysarthria, ataxia, cervicalgia, cough, fever chills abdominal pain, nausea vomiting, change in bowel or bladder habits, syncope.  Patient has had similar symptoms intermittently in the past but they've never been this severe and never lasted this long.    Past Medical History  Diagnosis Date  . Asthma   . Hypertension   . Legally blind    History reviewed. No pertinent past surgical history. History reviewed. No pertinent family history. History  Substance Use Topics  . Smoking status: Never Smoker   . Smokeless tobacco: Not on file  . Alcohol Use: Yes     Comment: occ    Review of Systems  10 systems reviewed and found to be negative, except as noted in the HPI.   Allergies  Review of patient's allergies indicates no known allergies.  Home Medications   Prior to Admission medications   Medication Sig Start Date End Date Taking? Authorizing Provider  amLODipine (NORVASC) 5 MG tablet Take 1 tablet (5 mg total) by mouth daily. 01/15/14   Euphemia Lingerfelt, PA-C  diazepam (VALIUM) 5 MG tablet Take 1 tablet (5 mg  total) by mouth 2 (two) times daily. 01/15/14   Ulric Salzman, PA-C  metoprolol (LOPRESSOR) 50 MG tablet Take 1 tablet (50 mg total) by mouth 2 (two) times daily. 01/15/14   Cristofher Livecchi, PA-C   BP 136/52  Pulse 70  Temp(Src) 98.1 F (36.7 C) (Oral)  Resp 18  Ht 6\' 1"  (1.854 m)  Wt 323 lb (146.512 kg)  BMI 42.62 kg/m2  SpO2 98% Physical Exam  Nursing note and vitals reviewed. Constitutional: He is oriented to person, place, and time. He appears well-developed and well-nourished. No distress.  Morbidly obese  HENT:  Head: Normocephalic.  Mouth/Throat: Oropharynx is clear and moist.  Eyes:  And can count fingers at 2 feet. Corneas are hazy and oddly shaped  Neck: Normal range of motion. Neck supple.  Cardiovascular: Normal rate, regular rhythm and intact distal pulses.   Pulmonary/Chest: Effort normal and breath sounds normal. No stridor. No respiratory distress. He has no wheezes. He has no rales. He exhibits tenderness.    Abdominal: Soft. Bowel sounds are normal.  Musculoskeletal: Normal range of motion. He exhibits no edema.  Neurological: He is alert and oriented to person, place, and time.  Psychiatric: He has a normal mood and affect.    ED Course  Procedures (including critical care time) Labs Review Labs Reviewed  BASIC METABOLIC PANEL - Abnormal; Notable for the following:    Anion gap 16 (*)    All other components within normal limits  CBC WITH DIFFERENTIAL  I-STAT TROPOININ, ED  CBG MONITORING, ED  Rosezena SensorI-STAT TROPOININ, ED  Imaging Review Dg Chest 2 View  01/15/2014   CLINICAL DATA:  Two day history of left-sided chest pain and right hand numbness. Current history of hypertension an asthma. Current smoker.  EXAM: CHEST  2 VIEW  COMPARISON:  Portable chest x-ray 04/20/2013. Two-view chest x-ray 05/01/2010. CT coronary calcium score 05/05/2010. CTA chest 05/03/2010.  FINDINGS: Cardiac silhouette upper normal in size to mildly enlarged but stable. Hilar and  mediastinal contours otherwise unremarkable. Lungs clear. Bronchovascular markings normal. Pulmonary vascularity normal. No visible pleural effusions. No pneumothorax. Visualized bony thorax intact.  IMPRESSION: Stable borderline to mild cardiomegaly. No acute cardiopulmonary disease.   Electronically Signed   By: Hulan Saas M.D.   On: 01/15/2014 13:20   Ct Head Wo Contrast  01/15/2014   CLINICAL DATA:  Hypertension with headaches  EXAM: CT HEAD WITHOUT CONTRAST  TECHNIQUE: Contiguous axial images were obtained from the base of the skull through the vertex without intravenous contrast.  COMPARISON:  None.  FINDINGS: Normal gray-white differentiation. Ventricles normal in size. Negative for intra or extra-axial hemorrhage, mass effect, mass lesion, or evidence of acute cortically based infarction. Visualized paranasal sinuses and mastoid air cells are clear. The skull is intact. Soft tissues of the scalp are symmetric. Soft tissues of the orbits within normal limits.  IMPRESSION: Normal head CT.   Electronically Signed   By: Britta Mccreedy M.D.   On: 01/15/2014 13:42   Ct Angio Chest Aortic Dissect W &/or W/o  01/15/2014   CLINICAL DATA:  Shortness of breath.  EXAM: CT ANGIOGRAPHY CHEST WITH CONTRAST  TECHNIQUE: Multidetector CT imaging of the chest was performed using the standard protocol during bolus administration of intravenous contrast. Multiplanar CT image reconstructions and MIPs were obtained to evaluate the vascular anatomy.  CONTRAST:  OMNIPAQUE IOHEXOL 350 MG/ML SOLN  COMPARISON:  Chest x-ray 01/15/2014 .  FINDINGS: Thoracic aorta is unremarkable. No evidence of aneurysm or dissection. Pulmonary arteries are normal.  No significant mediastinal adenopathy. The thoracic esophagus is unremarkable.  Large airways are patent. Low lung volumes. No pleural effusion or pneumothorax.  Adrenals are unremarkable. Fatty infiltration liver. Visualized upper abdominal structures are unremarkable.   Possible solid nodule left lower thyroid lobe lobe. Thyroid ultrasound suggested for further evaluation. A component of the thyroid is substernal. No acute bony abnormality identified. No axillary adenopathy. Chest wall intact.  Review of the MIP images confirms the above findings.  IMPRESSION: 1. No acute cardiopulmonary disease. 2. Possible solid nodule left lobe of thyroid. A component is substernal. Thyroid ultrasound should be considered for further evaluation.   Electronically Signed   By: Maisie Fus  Register   On: 01/15/2014 15:36     EKG Interpretation   Date/Time:  Friday January 15 2014 15:48:27 EDT Ventricular Rate:  63 PR Interval:  155 QRS Duration: 94 QT Interval:  477 QTC Calculation: 488 R Axis:   19 Text Interpretation:  Sinus rhythm LVH by voltage Abnrm T, probable  ischemia, anterolateral lds Prolonged QT interval Since last tracing of  earlier today No significant change was found Confirmed by WENTZ  MD,  ELLIOTT ([id number]) on 01/15/2014 3:52:28 PM      MDM   Final diagnoses:  Essential hypertension  Non compliance w medication regimen  Chest wall pain  Acute nonintractable headache, unspecified headache type  Keratoconus of both eyes  Thyroid nodule    Filed Vitals:   01/15/14 1545 01/15/14 1600 01/15/14 1615 01/15/14 1625  BP: 116/51 130/58 136/52   Pulse: 64 65  74 70  Temp:      TempSrc:      Resp: 24 19 18 18   Height:      Weight:      SpO2: 98% 100% 100% 98%    Medications  aspirin chewable tablet 324 mg (324 mg Oral Given 01/15/14 1213)  nitroGLYCERIN (NITROGLYN) 2 % ointment 1 inch (1 inch Topical Given 01/15/14 1245)  metoprolol tartrate (LOPRESSOR) tablet 50 mg (50 mg Oral Given 01/15/14 1245)  amLODipine (NORVASC) tablet 5 mg (5 mg Oral Given 01/15/14 1245)  0.9 %  sodium chloride infusion ( Intravenous New Bag/Given 01/15/14 1430)  morphine 4 MG/ML injection 4 mg (4 mg Intravenous Given 01/15/14 1401)  diphenhydrAMINE (BENADRYL) injection 25 mg (25 mg  Intravenous Given 01/15/14 1425)  iohexol (OMNIPAQUE) 350 MG/ML injection 100 mL (100 mLs Intravenous Contrast Given 01/15/14 1449)  diazepam (VALIUM) tablet 5 mg (5 mg Oral Given 01/15/14 1618)  ondansetron (ZOFRAN) injection 4 mg (4 mg Intravenous Given 01/15/14 1618)  ketorolac (TORADOL) 30 MG/ML injection 30 mg (30 mg Intravenous Given 01/15/14 1618)    HERSCHELL VIRANI is a 33 y.o. male presenting with chest pain, right hand paresthesia, change in vision onset yesterday. Patient was extremely elevated blood pressure, noncompliant with hypertensive medications for 1 year. EKG with extensive T wave inversions, likely LVH with strain pattern, unchanged from prior. Troponin is negative. Chest x-ray with no widening of the mediastinum, there is stable cardiomegaly. Blood work and head CT unremarkable.  Bilateral blood pressures in the upper extremities show a difference of greater than 40 mmHg in the upper extremities. CT chest to rule out dissection is ordered.  ECT NGO shows no thoracic abnormality. There may be a solid nodule in the left thyroid.  Repeat EKG and felt a troponin are negative. Blood pressure has responded very well to oral antihypertensives. Patient continues to have chest pain however it is reproducible to palpation.   Spoke with care management RN want who has made the patient an appointment at both internal medicine clinic and Dr. Daleen Squibb for his chest pain.  This is a shared visit with the attending physician who personally evaluated the patient and agrees with the care plan.    Evaluation does not show pathology that would require ongoing emergent intervention or inpatient treatment. Pt is hemodynamically stable and mentating appropriately. Discussed findings and plan with patient/guardian, who agrees with care plan. All questions answered. Return precautions discussed and outpatient follow up given.   New Prescriptions   AMLODIPINE (NORVASC) 5 MG TABLET    Take 1 tablet (5 mg  total) by mouth daily.   DIAZEPAM (VALIUM) 5 MG TABLET    Take 1 tablet (5 mg total) by mouth 2 (two) times daily.   METOPROLOL (LOPRESSOR) 50 MG TABLET    Take 1 tablet (50 mg total) by mouth 2 (two) times daily.         Wynetta Emery, PA-C 01/15/14 [phone]

## 2014-01-15 NOTE — ED Notes (Signed)
Pt noted to have hives/puritis at hand IV site and above post administration morphine. Benadryl administered hives/puritis resolved.

## 2014-01-18 NOTE — ED Provider Notes (Signed)
Medical screening examination/treatment/procedure(s) were conducted as a shared visit with non-physician practitioner(s) and myself.  I personally evaluated the patient during the encounter.   EKG Interpretation   Date/Time:  Friday January 15 2014 15:48:27 EDT Ventricular Rate:  63 PR Interval:  155 QRS Duration: 94 QT Interval:  477 QTC Calculation: 488 R Axis:   19 Text Interpretation:  Sinus rhythm LVH by voltage Abnrm T, probable  ischemia, anterolateral lds Prolonged QT interval Since last tracing of  earlier today No significant change was found Confirmed by Effie ShyWENTZ  MD,  ELLIOTT [phone]([id number]) on 01/15/2014 3:52:28 PM      Comer LocketWalter D Decker is a 33 y.o. male hx of HTN here with chest pain. Uncompliant with HTN meds and hasn't been taking it for a month. Intermittent chest pain for several months. Worse yesterday, with radiation to the back. Patient was hypertensive initially 200/100. Slight variation on bilateral arms. Otherwise cardiac exam unremarkable. + reproducible tenderness on chest. Abdomen soft, nontender. Neuro exam unremarkable. Concerned for possible dissection. CXR showed no widened mediastinum. CT angio showed no dissection. Trop neg x 2. Labs unremarkable. Given PO BP meds and BP dec to 126/60. I doubt hypertensive emergency. I told him to take his meds and f/u with PMD.    Richardean Canalavid H Kynzlee Hucker, MD 01/18/14 [phone]

## 2014-01-20 ENCOUNTER — Ambulatory Visit: Payer: Self-pay | Attending: Cardiology | Admitting: Cardiology

## 2014-01-20 ENCOUNTER — Encounter: Payer: Self-pay | Admitting: Cardiology

## 2014-01-20 VITALS — BP 108/79 | HR 87 | Temp 98.3°F | Resp 20 | Ht 73.0 in | Wt 333.0 lb

## 2014-01-20 DIAGNOSIS — R079 Chest pain, unspecified: Secondary | ICD-10-CM

## 2014-01-20 DIAGNOSIS — Z6841 Body Mass Index (BMI) 40.0 and over, adult: Secondary | ICD-10-CM | POA: Insufficient documentation

## 2014-01-20 DIAGNOSIS — I10 Essential (primary) hypertension: Secondary | ICD-10-CM

## 2014-01-20 DIAGNOSIS — E785 Hyperlipidemia, unspecified: Secondary | ICD-10-CM

## 2014-01-20 DIAGNOSIS — R9431 Abnormal electrocardiogram [ECG] [EKG]: Secondary | ICD-10-CM

## 2014-01-20 NOTE — Progress Notes (Signed)
Patient recently in ED on 01/15/14 with chest pain, headache, difficulty seeing. Patient denies chest pain, shortness of breath, headaches, swelling since ED visit. Patient taking BP medications as prescribed.

## 2014-01-27 ENCOUNTER — Encounter: Payer: Self-pay | Admitting: Cardiology

## 2014-01-27 NOTE — Assessment & Plan Note (Signed)
Good control as long as he is taking his meds. Reassured him that hypertension is for upper he cannot stop his drugs.

## 2014-01-27 NOTE — Assessment & Plan Note (Signed)
I encouraged him to lose weight.

## 2014-01-27 NOTE — Assessment & Plan Note (Signed)
Only diet indicated at this point.

## 2014-01-27 NOTE — Assessment & Plan Note (Signed)
Noncardiac. Patient given reassurance. No further cardiac testing.

## 2014-01-27 NOTE — Progress Notes (Signed)
HPI [name] comes in today for followup after being seen in emergency room with chest. He is a 33 year old African American male with multiple comorbidities including hypertension, morbid obesity, history of asthma, and gout. I saw this patient in 2012 an  abnormal EKG and chest pain. At that time I felt was noncardiac and no cardiac testing was indicated.  In the emergency room he had a chest CT which was negative including no sign of pulmonary embolus. Because of chronic headaches, had a head CT which was normal. Cardiac markers were negative. Chest x-ray showed mild cardiomegaly. EKG shows sinus rhythm with profound ST-T wave inversion which is unchanged and consistent with his history of hypertension. An echocardiogram in 2011 showed normal left ventricular systolic function, ejection fraction 60%, moderate LVH.  He is an anxious individual has chronic chest pain and chronic headaches. He needs a lot of reassurance.  Past Medical History  Diagnosis Date  . Asthma   . Hypertension   . Legally blind     Current Outpatient Prescriptions  Medication Sig Dispense Refill  . amLODipine (NORVASC) 5 MG tablet Take 1 tablet (5 mg total) by mouth daily.  30 tablet  2  . metoprolol (LOPRESSOR) 50 MG tablet Take 1 tablet (50 mg total) by mouth 2 (two) times daily.  60 tablet  2   No current facility-administered medications for this visit.    Allergies  Allergen Reactions  . Morphine And Related Itching and Swelling    History reviewed. No pertinent family history.  History   Social History  . Marital Status: Married    Spouse Name: N/A    Number of Children: N/A  . Years of Education: N/A   Occupational History  . Not on file.   Social History Main Topics  . Smoking status: Never Smoker   . Smokeless tobacco: Not on file  . Alcohol Use: Yes     Comment: occ  . Drug Use: No  . Sexual Activity: Not on file   Other Topics Concern  . Not on file   Social History Narrative   . No narrative on file    ROS ALL NEGATIVE EXCEPT THOSE NOTED IN HPI  PE  General Appearance: well developed, well nourished in no acute distress, morbidly obese HEENT: symmetrical face, PERRLA, good dentition  Neck: no JVD, thyromegaly, or adenopathy, trachea midline Chest: symmetric without deformity Cardiac: PMI poorly appreciated, RRR, normal S1, S2, no gallop or murmur Lung: clear to ausculation and percussion Vascular: all pulses full without bruits  Abdominal: nondistended, nontender, good bowel sounds, no HSM, no bruits Extremities: no cyanosis, clubbing or edema, no sign of DVT, no varicosities  Skin: normal color, no rashes Neuro: alert and oriented x 3, non-focal Pysch: normal affect  EKG Not repeated. BMET    Component Value Date/Time   NA 137 01/15/2014 1240   K 4.4 01/15/2014 1240   CL 102 01/15/2014 1240   CO2 19 01/15/2014 1240   GLUCOSE 85 01/15/2014 1240   BUN 11 01/15/2014 1240   CREATININE 1.04 01/15/2014 1240   CALCIUM 8.9 01/15/2014 1240   GFRNONAA >90 01/15/2014 1240   GFRAA >90 01/15/2014 1240    Lipid Panel     Component Value Date/Time   CHOL 180 04/21/2013 0519   TRIG 227* 04/21/2013 0519   HDL 27* 04/21/2013 0519   CHOLHDL 6.7 04/21/2013 0519   VLDL 45* 04/21/2013 0519   LDLCALC 108* 04/21/2013 0519    CBC  Component Value Date/Time   WBC 9.5 01/15/2014 1240   RBC 5.38 01/15/2014 1240   HGB 15.0 01/15/2014 1240   HCT 45.2 01/15/2014 1240   PLT 260 01/15/2014 1240   MCV 84.0 01/15/2014 1240   MCH 27.9 01/15/2014 1240   MCHC 33.2 01/15/2014 1240   RDW 14.2 01/15/2014 1240   LYMPHSABS 3.9 01/15/2014 1240   MONOABS 0.5 01/15/2014 1240   EOSABS 0.2 01/15/2014 1240   BASOSABS 0.0 01/15/2014 1240

## 2014-01-27 NOTE — Assessment & Plan Note (Signed)
No change 

## 2014-02-17 ENCOUNTER — Ambulatory Visit: Payer: Self-pay

## 2014-03-17 ENCOUNTER — Encounter (HOSPITAL_COMMUNITY): Payer: Self-pay | Admitting: Emergency Medicine

## 2014-03-17 ENCOUNTER — Emergency Department (HOSPITAL_COMMUNITY)
Admission: EM | Admit: 2014-03-17 | Discharge: 2014-03-17 | Disposition: A | Discharge status: Home / Self Care | Payer: Self-pay | Attending: Emergency Medicine | Admitting: Emergency Medicine

## 2014-03-17 ENCOUNTER — Emergency Department (HOSPITAL_COMMUNITY): Payer: Self-pay

## 2014-03-17 DIAGNOSIS — I10 Essential (primary) hypertension: Secondary | ICD-10-CM | POA: Insufficient documentation

## 2014-03-17 DIAGNOSIS — J45909 Unspecified asthma, uncomplicated: Secondary | ICD-10-CM | POA: Insufficient documentation

## 2014-03-17 DIAGNOSIS — H548 Legal blindness, as defined in USA: Secondary | ICD-10-CM | POA: Insufficient documentation

## 2014-03-17 DIAGNOSIS — M25579 Pain in unspecified ankle and joints of unspecified foot: Secondary | ICD-10-CM | POA: Insufficient documentation

## 2014-03-17 DIAGNOSIS — M7660 Achilles tendinitis, unspecified leg: Secondary | ICD-10-CM | POA: Insufficient documentation

## 2014-03-17 DIAGNOSIS — Z79899 Other long term (current) drug therapy: Secondary | ICD-10-CM | POA: Insufficient documentation

## 2014-03-17 DIAGNOSIS — M7662 Achilles tendinitis, left leg: Secondary | ICD-10-CM

## 2014-03-17 MED ORDER — NAPROXEN 375 MG PO TABS: 375.0000 mg | ORAL_TABLET | Freq: Two times a day (BID) | ORAL | Status: DC

## 2014-03-17 NOTE — ED Provider Notes (Signed)
CSN: [id number]     Arrival date & time 03/17/14  2201 History   First MD Initiated Contact with Patient 03/17/14 2228     Chief Complaint  Patient presents with  . Ankle Pain     (Consider location/radiation/quality/duration/timing/severity/associated sxs/prior Treatment) HPI  Pt to the ED with complaints of achilles pain after wearing some new shoes that he got last week. It has been progressively getting worse and now he reports being unable to put pressure on his foot. He denies it being swollen, red, or any trauma to it. He denies any weakness to the leg or numbness. He denies history of pain to this area.   Past Medical History  Diagnosis Date  . Asthma   . Hypertension   . Legally blind    History reviewed. No pertinent past surgical history. No family history on file. History  Substance Use Topics  . Smoking status: Never Smoker   . Smokeless tobacco: Not on file  . Alcohol Use: Yes     Comment: occ    Review of Systems   Review of Systems  Gen: no weight loss, fevers, chills, night sweats  Eyes: no occular draining, occular pain,  No visual changes  Nose: no epistaxis or rhinorrhea  Mouth: no dental pain, no sore throat  Neck: no neck pain  Lungs: No hemoptysis. No wheezing or coughing CV:  No palpitations, dependent edema or orthopnea. No chest pain Abd: no diarrhea. No nausea or vomiting, No abdominal pain  GU: no dysuria or gross hematuria  MSK:  No muscle weakness, + Left achilles muscular pain Neuro: no headache, no focal neurologic deficits  Skin: no rash , no wounds Psyche: no complaints of depression or anxiety    Allergies  Morphine and related  Home Medications   Prior to Admission medications   Medication Sig Start Date End Date Taking? Authorizing Provider  amLODipine (NORVASC) 5 MG tablet Take 1 tablet (5 mg total) by mouth daily. 01/15/14   Nicole Pisciotta, PA-C  metoprolol (LOPRESSOR) 50 MG tablet Take 1 tablet (50 mg total) by mouth 2  (two) times daily. 01/15/14   Nicole Pisciotta, PA-C  naproxen (NAPROSYN) 375 MG tablet Take 1 tablet (375 mg total) by mouth 2 (two) times daily. 03/17/14   Jarita Raval Irine Seal, PA-C   BP 190/99  Pulse 97  Temp(Src) 98.8 F (37.1 C) (Oral)  Resp 16  SpO2 97% Physical Exam  Nursing note and vitals reviewed. Constitutional: He appears well-developed and well-nourished. No distress.  HENT:  Head: Normocephalic and atraumatic.  Eyes: Pupils are equal, round, and reactive to light.  Neck: Normal range of motion. Neck supple.  Cardiovascular: Normal rate and regular rhythm.   Pulmonary/Chest: Effort normal.  Abdominal: Soft.  Musculoskeletal:       Left ankle: He exhibits normal range of motion, no swelling, no ecchymosis, no deformity, no laceration and normal pulse. Tenderness. Achilles tendon exhibits pain. Achilles tendon exhibits no defect and normal Thompson's test results.       Feet:  Pain with extention and flexion of foot but no decreased FROM. Tenderness to palpation of the achilles.  Neurological: He is alert.  Skin: Skin is warm and dry.    ED Course  Procedures (including critical care time) Labs Review Labs Reviewed - No data to display  Imaging Review Dg Ankle 2 Views Left  03/17/2014   CLINICAL DATA:  Left posterior ankle pain for 1 week, no trauma  EXAM: LEFT ANKLE - 2  VIEW  COMPARISON:  None.  FINDINGS: There is no evidence of fracture, dislocation, or joint effusion. There is no evidence of arthropathy or other focal bone abnormality. Soft tissues are unremarkable. Mortise view not obtained. Plantar calcaneal spurring is noted.  IMPRESSION: Negative.   Electronically Signed   By: Christiana Pellant M.D.   On: 03/17/2014 22:55     EKG Interpretation None      MDM   Final diagnoses:  Tendonitis, Achilles, left    Patient has no significant findings on xray or physical exam except for tenderness to palpation and pain with ROM. Strong pulses, no skin changes or  weakness. Will give Cam-Walker boot and crutches and well as start on NSAIDs. Referral to Ortho given. Return precautions given.   33 y.o.[name] Mckendree's evaluation in the Emergency Department is complete. It has been determined that no acute conditions requiring further emergency intervention are present at this time. The patient/guardian have been advised of the diagnosis and plan. We have discussed signs and symptoms that warrant return to the ED, such as changes or worsening in symptoms.  Vital signs are stable at discharge. Filed Vitals:   03/17/14 2212  BP: 190/99  Pulse: 97  Temp: 98.8 F (37.1 C)  Resp: 16    Patient/guardian has voiced understanding and agreed to follow-up with the PCP or specialist.      Dorthula Matas, PA-C 03/17/14 2333

## 2014-03-17 NOTE — ED Notes (Signed)
Pt. reports left ankle pain onset last week denies injury or fall , ambulatory / no swelling .

## 2014-03-17 NOTE — Progress Notes (Signed)
Orthopedic Tech Progress Note Patient Details:  Samuel Decker 1981-05-13 [id number]  Ortho Devices Type of Ortho Device: Crutches;CAM walker Ortho Device/Splint Interventions: Application   Haskell Flirt 03/17/2014, 11:26 PM

## 2014-03-17 NOTE — Discharge Instructions (Signed)
Achilles Tendinitis Achilles tendinitis is inflammation of the tough, cord-like band that attaches the lower muscles of your leg to your heel (Achilles tendon). It is usually caused by overusing the tendon and joint involved.  CAUSES Achilles tendinitis can happen because of:  A sudden increase in exercise or activity (such as running).  Doing the same exercises or activities (such as jumping) over and over.  Not warming up calf muscles before exercising.  Exercising in shoes that are worn out or not made for exercise.  Having arthritis or a bone growth on the back of the heel bone. This can rub against the tendon and hurt the tendon. SIGNS AND SYMPTOMS The most common symptoms are:  Pain in the back of the leg, just above the heel. The pain usually gets worse with exercise and better with rest.  Stiffness or soreness in the back of the leg, especially in the morning.  Swelling of the skin over the Achilles tendon.  Trouble standing on tiptoe. Sometimes, an Achilles tendon tears (ruptures). Symptoms of an Achilles tendon rupture can include:  Sudden, severe pain in the back of the leg.  Trouble putting weight on the foot or walking normally. DIAGNOSIS Achilles tendinitis will be diagnosed based on symptoms and a physical examination. An X-ray may be done to check if another condition is causing your symptoms. An MRI may be ordered if your health care provider suspects you may have completely torn your tendon, which is called an Achilles tendon rupture.  TREATMENT  Achilles tendinitis usually gets better over time. It can take weeks to months to heal completely. Treatment focuses on treating the symptoms and helping the injury heal. HOME CARE INSTRUCTIONS   Rest your Achilles tendon and avoid activities that cause pain.  Apply ice to the injured area:  Put ice in a plastic bag.  Place a towel between your skin and the bag.  Leave the ice on for 20 minutes, 2-3 times a  day  Try to avoid using the tendon (other than gentle range of motion) while the tendon is painful. Do not resume use until instructed by your health care provider. Then begin use gradually. Do not increase use to the point of pain. If pain does develop, decrease use and continue the above measures. Gradually increase activities that do not cause discomfort until you achieve normal use.  Do exercises to make your calf muscles stronger and more flexible. Your health care provider or physical therapist can recommend exercises for you to do.  Wrap your ankle with an elastic bandage or other wrap. This can help keep your tendon from moving too much. Your health care provider will show you how to wrap your ankle correctly.  Only take over-the-counter or prescription medicines for pain, discomfort, or fever as directed by your health care provider. SEEK MEDICAL CARE IF:   Your pain and swelling increase or pain is uncontrolled with medicines.  You develop new, unexplained symptoms or your symptoms get worse.  You are unable to move your toes or foot.  You develop warmth and swelling in your foot.  You have an unexplained temperature. MAKE SURE YOU:   Understand these instructions.  Will watch your condition.  Will get help right away if you are not doing well or get worse. Document Released: 03/28/2005 Document Revised: 04/08/2013 Document Reviewed: 01/28/2013 [employer] Patient Information 2015 [employer]. This information is not intended to replace advice given to you by your health care provider. Make sure you discuss   any questions you have with your health care provider.  

## 2014-03-23 NOTE — ED Provider Notes (Signed)
Medical screening examination/treatment/procedure(s) were performed by non-physician practitioner and as supervising physician I was immediately available for consultation/collaboration.   EKG Interpretation None        Tamiko Leopard, MD 03/23/14 1540 

## 2014-06-15 ENCOUNTER — Emergency Department (HOSPITAL_COMMUNITY)
Admission: EM | Admit: 2014-06-15 | Discharge: 2014-06-15 | Disposition: A | Discharge status: Home / Self Care | Payer: Medicare Other | Attending: Emergency Medicine | Admitting: Emergency Medicine

## 2014-06-15 ENCOUNTER — Emergency Department (HOSPITAL_COMMUNITY): Payer: Medicare Other

## 2014-06-15 ENCOUNTER — Encounter (HOSPITAL_COMMUNITY): Payer: Self-pay

## 2014-06-15 DIAGNOSIS — I10 Essential (primary) hypertension: Secondary | ICD-10-CM | POA: Diagnosis not present

## 2014-06-15 DIAGNOSIS — Z79899 Other long term (current) drug therapy: Secondary | ICD-10-CM | POA: Diagnosis not present

## 2014-06-15 DIAGNOSIS — R0789 Other chest pain: Secondary | ICD-10-CM | POA: Insufficient documentation

## 2014-06-15 DIAGNOSIS — J45909 Unspecified asthma, uncomplicated: Secondary | ICD-10-CM | POA: Diagnosis not present

## 2014-06-15 DIAGNOSIS — H109 Unspecified conjunctivitis: Secondary | ICD-10-CM

## 2014-06-15 DIAGNOSIS — R079 Chest pain, unspecified: Secondary | ICD-10-CM

## 2014-06-15 DIAGNOSIS — H548 Legal blindness, as defined in USA: Secondary | ICD-10-CM | POA: Diagnosis not present

## 2014-06-15 LAB — BASIC METABOLIC PANEL
Anion gap: 13 (ref 5–15)
BUN: 11 mg/dL (ref 6–23)
CO2: 23 mEq/L (ref 19–32)
Calcium: 9.1 mg/dL (ref 8.4–10.5)
Chloride: 103 mEq/L (ref 96–112)
Creatinine, Ser: 1.08 mg/dL (ref 0.50–1.35)
GFR calc Af Amer: >90 mL/min (ref >90)
GFR calc non Af Amer: >90 mL/min (ref >90)
Glucose, Bld: 88 mg/dL (ref 70–99)
Potassium: 4.1 mEq/L (ref 3.7–5.3)
Sodium: 139 mEq/L (ref 137–147)

## 2014-06-15 LAB — I-STAT TROPONIN, ED: TROPONIN I, POC: 0.03 ng/mL (ref 0.00–0.08)

## 2014-06-15 LAB — CBC
HEMATOCRIT: 46.5 % (ref 39.0–52.0)
Hemoglobin: 15.5 g/dL (ref 13.0–17.0)
MCH: 27.9 pg (ref 26.0–34.0)
MCHC: 33.3 g/dL (ref 30.0–36.0)
MCV: 83.6 fL (ref 78.0–100.0)
Platelets: 320 10*3/uL (ref 150–400)
RBC: 5.56 MIL/uL (ref 4.22–5.81)
RDW: 14.2 % (ref 11.5–15.5)
WBC: 11.4 10*3/uL — ABNORMAL HIGH (ref 4.0–10.5)

## 2014-06-15 MED ORDER — FLUORESCEIN SODIUM 1 MG OP STRP
1.0000 | ORAL_STRIP | Freq: Once | OPHTHALMIC | Status: AC
  Administered 2014-06-15: 1 via OPHTHALMIC
  Filled 2014-06-15: qty 1

## 2014-06-15 MED ORDER — AMLODIPINE BESYLATE 5 MG PO TABS: 5.0000 mg | ORAL_TABLET | Freq: Every day | ORAL | Status: DC

## 2014-06-15 MED ORDER — TOBRAMYCIN 0.3 % OP SOLN
1.0000 [drp] | Freq: Once | OPHTHALMIC | Status: AC
  Administered 2014-06-15: 1 [drp] via OPHTHALMIC
  Filled 2014-06-15: qty 5

## 2014-06-15 MED ORDER — METOPROLOL TARTRATE 25 MG PO TABS
50.0000 mg | ORAL_TABLET | Freq: Once | ORAL | Status: AC
  Administered 2014-06-15: 50 mg via ORAL
  Filled 2014-06-15: qty 2

## 2014-06-15 MED ORDER — METOPROLOL TARTRATE 50 MG PO TABS: 50.0000 mg | ORAL_TABLET | Freq: Two times a day (BID) | ORAL | Status: DC

## 2014-06-15 MED ORDER — TETRACAINE HCL 0.5 % OP SOLN
1.0000 [drp] | Freq: Once | OPHTHALMIC | Status: AC
  Administered 2014-06-15: 1 [drp] via OPHTHALMIC
  Filled 2014-06-15: qty 2

## 2014-06-15 MED ORDER — AMLODIPINE BESYLATE 5 MG PO TABS
5.0000 mg | ORAL_TABLET | Freq: Once | ORAL | Status: AC
  Administered 2014-06-15: 5 mg via ORAL
  Filled 2014-06-15: qty 1

## 2014-06-15 NOTE — Discharge Instructions (Signed)
Please read and follow all provided instructions.  Your diagnoses today include:  1. Chest pain, unspecified chest pain type   2. Chest pain   3. Bilateral conjunctivitis   4. Essential hypertension     Tests performed today include:  An EKG of your heart - unchanged from prior  A chest x-ray - normal  Cardiac enzymes - a blood test for heart muscle damage, no sign of heart attack  Blood counts and electrolytes  Vital signs. See below for your results today.   Medications prescribed:   Metoprolol and Amlodipine - medications for blood pressure   Tobrex (tobramycin) - antibiotic eye drops or eye ointment  Use this medication as follows:  Use 1-2 drops in affected eye every 4 hours while awake for 5 days.  Take any prescribed medications only as directed.  Follow-up instructions: Please follow-up with your primary care provider as soon as you can for further evaluation of your symptoms  Follow-up with the eye specialist as soon as possible for your eye troubles. This is very important.    Return instructions:  SEEK IMMEDIATE MEDICAL ATTENTION IF:  You have severe chest pain, especially if the pain is crushing or pressure-like and spreads to the arms, back, neck, or jaw, or if you have sweating, nausea (feeling sick to your stomach), or shortness of breath. THIS IS AN EMERGENCY. Don't wait to see if the pain will go away. Get medical help at once. Call 911 or 0 (operator). DO NOT drive yourself to the hospital.   Your chest pain gets worse and does not go away with rest.   You have an attack of chest pain lasting longer than usual, despite rest and treatment with the medications your caregiver has prescribed.   You wake from sleep with chest pain or shortness of breath.  You feel dizzy or faint.  You have chest pain not typical of your usual pain for which you originally saw your caregiver.   You have any other emergent concerns regarding your health.  Additional  Information: Chest pain comes from many different causes. Your caregiver has diagnosed you as having chest pain that is not specific for one problem, but does not require admission.  You are at low risk for an acute heart condition or other serious illness.   Your vital signs today were: BP 156/63 mmHg   Pulse 73   Temp(Src) 98.3 F (36.8 C) (Oral)   Resp 27   Ht 6\' 1"  (1.854 m)   SpO2 99% If your blood pressure (BP) was elevated above 135/85 this visit, please have this repeated by your doctor within one month. --------------

## 2014-06-15 NOTE — ED Notes (Signed)
Pt here for cp that started 2 days ago, hypertension. Also reports bilateral eye blurriness. Has a hx of HTN and is legally blind in both eyes. Cp is tight in nature, no radiation. Reports headaches as well with the HTN,

## 2014-06-15 NOTE — ED Provider Notes (Signed)
CSN: [id number]     Arrival date & time 06/15/14  1759 History   First MD Initiated Contact with Patient 06/15/14 1934     Chief Complaint  Patient presents with  . Chest Pain  . Hypertension  . Eye Problem     (Consider location/radiation/quality/duration/timing/severity/associated sxs/prior Treatment) HPI Comments: Patient with history of hypertension, keratoconus -- presents with c/o constant left-sided chest pain starting yesterday. Pain is 'tight' and does not radiate. He is not short of breath. No palpitations or lightheadedness. Patient also noted bilateral eye blurriness for the past 3 days. He wears contact lenses and states that his vision is more blurry than usual while he is wearing his contacts (although he has only been wearing the right one as the left is lost). He is trying to have treatment performed next year when he has insurance. Patient states that he has been taking his amlodipine and metoprolol however ran out this morning. He has HA. Patient states that his chest pain is the same as when he was seen in 12/2013. He states that his symptoms improved with better control of his blood pressure.   H/o HTN, family h/o MI in father who was younger. Non-smoker, no DM, high cholesterol. No ASA PTA.   Cardiac CT 05/2010:  IMPRESSION: 1. Calcium score = 0 Agatston units, suggesting very low risk of cardiac events.  2. No coronary disease noted on coronary CT angiogram.  Patient is a 33 y.o. male presenting with chest pain, hypertension, and eye problem. The history is provided by the patient and medical records.  Chest Pain Associated symptoms: no abdominal pain, no back pain, no cough, no diaphoresis, no fever, no nausea, no palpitations, no shortness of breath and not vomiting   Hypertension Associated symptoms include chest pain. Pertinent negatives include no abdominal pain, coughing, diaphoresis, fever, nausea, neck pain, rash or vomiting.  Eye Problem Associated  symptoms: no nausea, no redness and no vomiting     Past Medical History  Diagnosis Date  . Asthma   . Hypertension   . Legally blind    History reviewed. No pertinent past surgical history. No family history on file. History  Substance Use Topics  . Smoking status: Never Smoker   . Smokeless tobacco: Not on file  . Alcohol Use: Yes     Comment: occ    Review of Systems  Constitutional: Negative for fever and diaphoresis.  Eyes: Positive for visual disturbance. Negative for redness.  Respiratory: Negative for cough and shortness of breath.   Cardiovascular: Positive for chest pain. Negative for palpitations and leg swelling.  Gastrointestinal: Negative for nausea, vomiting and abdominal pain.  Genitourinary: Negative for dysuria.  Musculoskeletal: Negative for back pain and neck pain.  Skin: Negative for rash.  Neurological: Negative for syncope and light-headedness.      Allergies  Morphine and related  Home Medications   Prior to Admission medications   Medication Sig Start Date End Date Taking? Authorizing Provider  amLODipine (NORVASC) 5 MG tablet Take 1 tablet (5 mg total) by mouth daily. 01/15/14  Yes Nicole Pisciotta, PA-C  ibuprofen (ADVIL,MOTRIN) 200 MG tablet Take 400 mg by mouth every 6 (six) hours as needed for moderate pain.   Yes Historical Provider, MD  metoprolol (LOPRESSOR) 50 MG tablet Take 1 tablet (50 mg total) by mouth 2 (two) times daily. 01/15/14  Yes Nicole Pisciotta, PA-C   BP 173/93 mmHg  Pulse 77  Temp(Src) 98.3 F (36.8 C) (Oral)  Resp 23  Ht 6\' 1"  (1.854 m)  SpO2 98%   Physical Exam  Constitutional: He appears well-developed and well-nourished.  HENT:  Head: Normocephalic and atraumatic.  Mouth/Throat: Mucous membranes are normal. Mucous membranes are not dry.  Slight crusting noted medial canthus of R eye.   Eyes: Lids are normal. Pupils are equal, round, and reactive to light. Right conjunctiva is injected. Right conjunctiva has  no hemorrhage. Left conjunctiva is injected. Left conjunctiva has no hemorrhage.  Slit lamp exam:      The left eye shows no corneal abrasion and no corneal flare.  There are small areas of haziness on both corneas. Patient and family at bedside state that these are chronic. 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   Neck: Trachea normal and normal range of motion. Neck supple. Normal carotid pulses and no JVD present. No muscular tenderness present. Carotid bruit is not present. No tracheal deviation present.  Cardiovascular: Normal rate, regular rhythm, S1 normal, S2 normal, normal heart sounds and intact distal pulses.  Exam reveals no distant heart sounds and no decreased pulses.   No murmur heard. Pulmonary/Chest: Effort normal and breath sounds normal. No respiratory distress. He has no wheezes. He exhibits no tenderness.  Abdominal: Soft. Normal aorta and bowel sounds are normal. There is no tenderness. There is no rebound and no guarding.  Musculoskeletal: He exhibits no edema.  Neurological: He is alert.  Skin: Skin is warm and dry. He is not diaphoretic. No cyanosis. No pallor.  Psychiatric: He has a normal mood and affect.  Nursing note and vitals reviewed.   ED Course  Procedures (including critical care time) Labs Review Labs Reviewed  CBC - Abnormal; Notable for the following:    WBC 11.4 (*)    All other components within normal limits  BASIC METABOLIC PANEL  I-STAT TROPOININ, ED    Imaging Review Dg Chest 2 View  06/15/2014   CLINICAL DATA:  Acute chest pain.  EXAM: CHEST  2 VIEW  COMPARISON:  January 15, 2014.  FINDINGS: The heart size and mediastinal contours are within normal limits. Both lungs are clear. No pneumothorax or pleural effusion is noted. The visualized skeletal structures are unremarkable.  IMPRESSION: No acute cardiopulmonary abnormality seen.   Electronically Signed   By: Roque LiasJames  Green M.D.   On: 06/15/2014 20:20      EKG Interpretation   Date/Time:  Tuesday June 15 2014 18:11:04 EST Ventricular Rate:  88 PR Interval:  156 QRS Duration: 98 QT Interval:  398 QTC Calculation: 481 R Axis:   41 Text Interpretation:  Sinus rhythm with Premature atrial complexes Marked  T-wave abnormality, consider inferolateral ischemia Prolonged QT Abnormal  ECG Since last tracing of earlier today No significant change was found  Confirmed by [facility] Center[facility]CCMANUS  MD, Nicholos JohnsKATHLEEN [phone]([id number]) on 06/15/2014 8:13:52 PM       7:54 PM Patient seen and examined. EKG abnormal but unchanged since 12/2013. Work-up initiated.   Vital signs reviewed and are as follows: BP 173/93 mmHg  Pulse 77  Temp(Src) 98.3 F (36.8 C) (Oral)  Resp 23  Ht 6\' 1"  (1.854 m)  SpO2 98%  8:30 PM X-ray reviewed.   Two drops of tetracaine instilled into L eye. Patient is currently wearing contact in R eye only. He does not have instrument that he uses to remove contact and does not want to remove it stating they are $300/contact lens and he does not want to lose it. For this reason, exam only  performed on L eye.   Fluorescein strip applied to left eye. Wood's lamp used to assess for corneal abrasion/ulcer. No corneal abrasion or ulcer identified. The small hazy area noted on exam has some uptake noted. It does not appear to be ulcerated.    Patient tolerated procedure well without immediate complication.   Chest pain improved in ED. No SOB. States that L eye pain temporarily improved with drops.   Feel patient can be discharged to home.   For eye problem: Feel that he should follow-up in 1-2 days to establish care with ophthalmology and for recheck of exam. Discussed use of tobra drops and that patient should not wear contacts and needs to replace or sterilize the ones he has. Unfortunately he only has contacts and I'm not sure he will be compliant with this as he is legally blind without them.   For CP: Encouraged f/u with PCP ASAP. Patient was counseled  to return with severe chest pain, especially if the pain is crushing or pressure-like and spreads to the arms, back, neck, or jaw, or if they have sweating, nausea, or shortness of breath with the pain. They were encouraged to call 911 with these symptoms.   They were also told to return if their chest pain gets worse and does not go away with rest, they have an attack of chest pain lasting longer than usual despite rest and treatment with the medications their caregiver has prescribed, if they wake from sleep with chest pain or shortness of breath, if they feel dizzy or faint, if they have chest pain not typical of their usual pain, or if they have any other emergent concerns regarding their health.  The patient verbalized understanding and agreed.     MDM   Final diagnoses:  Chest pain, unspecified chest pain type  Bilateral conjunctivitis  Essential hypertension   CP: Patient with chest tightness. Feel patient is low risk for ACS given history (poor story for ACS/MI, with constant non-exertional CP since yesterday), negative troponin > 18 hrs after onset of symptoms, abnormal but unchanged EKG. It is reassuring that patient had coronary CT 4 years ago with Calcium score of 0. Work-up otherwise unrevealing. Patient had same symptoms recently and was ruled out in hospital and discharged at last ED visit. I doubt ACS or other dangerous etiology of CP including dissection or PE. Feel safe for d/c with follow-up.   Eye complaint: Known keratoconus. Patient with hazy areas on corneas which might be concerning for ulcer however patient does not have significant pain and both patient and family state these have been there for a long time. Otherwise no corneal abrasion. Appearance is concerning for bilateral conjunctivitis and he was placed on tobra drops 2/2 contact lens usage to cover Pseudomonas. I feel that he should follow-up with eye specialist in 1-2 days and he was given referral. No foreign  bodies noted. No surrounding erythema, swelling, vision changes/loss suspicious for orbital or periorbital cellulitis. No signs of iritis. No signs of glaucoma. No symptoms of retinal detachment. No ophthalmologic emergency suspected.   No dangerous or life-threatening conditions suspected or identified by history, physical exam, and by work-up. No indications for hospitalization identified.          Samuel CriglerJoshua Arriel Victor, PA-C 06/16/14 0046  Samuel JesterKathleen McManus, DO 06/18/14 1350

## 2014-08-25 ENCOUNTER — Emergency Department (HOSPITAL_COMMUNITY): Payer: Medicare HMO

## 2014-08-25 ENCOUNTER — Encounter (HOSPITAL_COMMUNITY): Payer: Self-pay | Admitting: Emergency Medicine

## 2014-08-25 ENCOUNTER — Emergency Department (HOSPITAL_COMMUNITY)
Admission: EM | Admit: 2014-08-25 | Discharge: 2014-08-25 | Disposition: A | Discharge status: Home / Self Care | Payer: Medicare HMO | Attending: Emergency Medicine | Admitting: Emergency Medicine

## 2014-08-25 DIAGNOSIS — I159 Secondary hypertension, unspecified: Secondary | ICD-10-CM | POA: Diagnosis not present

## 2014-08-25 DIAGNOSIS — H548 Legal blindness, as defined in USA: Secondary | ICD-10-CM | POA: Insufficient documentation

## 2014-08-25 DIAGNOSIS — J45909 Unspecified asthma, uncomplicated: Secondary | ICD-10-CM | POA: Insufficient documentation

## 2014-08-25 DIAGNOSIS — M25512 Pain in left shoulder: Secondary | ICD-10-CM | POA: Diagnosis not present

## 2014-08-25 DIAGNOSIS — Z79899 Other long term (current) drug therapy: Secondary | ICD-10-CM | POA: Diagnosis not present

## 2014-08-25 MED ORDER — METOPROLOL TARTRATE 25 MG PO TABS: 50.0000 mg | ORAL_TABLET | Freq: Two times a day (BID) | ORAL | Status: DC

## 2014-08-25 MED ORDER — METOPROLOL TARTRATE 25 MG PO TABS
50.0000 mg | ORAL_TABLET | Freq: Once | ORAL | Status: AC
  Administered 2014-08-25: 50 mg via ORAL
  Filled 2014-08-25: qty 2

## 2014-08-25 MED ORDER — CYCLOBENZAPRINE HCL 10 MG PO TABS: 10.0000 mg | ORAL_TABLET | Freq: Two times a day (BID) | ORAL | Status: DC | PRN

## 2014-08-25 MED ORDER — METOPROLOL TARTRATE 50 MG PO TABS: 50.0000 mg | ORAL_TABLET | Freq: Two times a day (BID) | ORAL | Status: DC

## 2014-08-25 MED ORDER — ACETAMINOPHEN 325 MG PO TABS
650.0000 mg | ORAL_TABLET | Freq: Once | ORAL | Status: AC
  Administered 2014-08-25: 650 mg via ORAL
  Filled 2014-08-25: qty 2

## 2014-08-25 NOTE — Discharge Instructions (Signed)

## 2014-08-25 NOTE — ED Notes (Signed)
Patient requested Pain medication PA made aware.

## 2014-08-25 NOTE — ED Provider Notes (Signed)
CSN: [id number]     Arrival date & time 08/25/14  4098 History   First MD Initiated Contact with Patient 08/25/14 [phone]     Chief Complaint  Patient presents with  . Arm Pain     (Consider location/radiation/quality/duration/timing/severity/associated sxs/prior Treatment) HPI    PCP: Doris Cheadle, MD Blood pressure 191/127, pulse 104, temperature 98.3 F (36.8 C), temperature source Oral, resp. rate 17, SpO2 98 %.  Samuel Decker is a 34 y.o.male with a significant PMH of asthma, hypertension, gout, legally blind presents to the ER with complaints of left shoulder pain. He reports waking up out of his sleep with severe pain that is intermittent, a mix between sharp and cramping. The pain is illicit ed by raising his arms over his shoulder. He also report swelling. He denies having any injury past or present that he can remember. He d enies hx of blood clot but was encouraged by his mom to come to the ED to r/o blood clot or sprain. He has not had any cold in his hand, recent fall or trauma, hx of the same, past surgery to the arm, hx of blood clots. Denies weakness to the arm or decreased grip strength. Denies CP, SOB, neck pain, headache, back pain, confusion.   Past Medical History  Diagnosis Date  . Asthma   . Hypertension   . Legally blind    History reviewed. No pertinent past surgical history. No family history on file. History  Substance Use Topics  . Smoking status: Never Smoker   . Smokeless tobacco: Not on file  . Alcohol Use: Yes     Comment: occ    Review of Systems  10 Systems reviewed and are negative for acute change except as noted in the HPI.    Allergies  Morphine and related  Home Medications   Prior to Admission medications   Medication Sig Start Date End Date Taking? Authorizing Provider  acetaminophen (TYLENOL) 500 MG tablet Take 500 mg by mouth every 6 (six) hours as needed for moderate pain.   Yes Historical Provider, MD  amLODipine  (NORVASC) 5 MG tablet Take 1 tablet (5 mg total) by mouth daily. 06/15/14  Yes Renne Crigler, PA-C  Nutritional Supplements (ADULT NUTRITIONAL SUPPLEMENT PO) Take 1 Units by mouth 3 (three) times daily. Take 1 Hydroxycut gummy three times per day for weight loss.   Yes Historical Provider, MD  OVER THE COUNTER MEDICATION Take 2 tablets by mouth as needed (pain).   Yes Historical Provider, MD  PRESCRIPTION MEDICATION Place 1 drop into both eyes as needed (redness).   Yes Historical Provider, MD  metoprolol (LOPRESSOR) 50 MG tablet Take 1 tablet (50 mg total) by mouth 2 (two) times daily. Patient not taking: Reported on 08/25/2014 06/15/14   Renne Crigler, PA-C   BP 191/127 mmHg  Pulse 104  Temp(Src) 98.3 F (36.8 C) (Oral)  Resp 17  SpO2 98% Physical Exam  Constitutional: He appears well-developed and well-nourished. No distress.  HENT:  Head: Normocephalic and atraumatic.  Eyes: Pupils are equal, round, and reactive to light.  Neck: Normal range of motion. Neck supple.  Cardiovascular: Normal rate and regular rhythm.   Pulmonary/Chest: Effort normal.  No lower extremity swelling or chest wall pain. No rales, crackles or wheezing.   Abdominal: Soft.  Musculoskeletal:  Exam limited by body habitus. Tenderness to the left shoulder, minimal amount of swelling appreciated. Pt has pain with ROM passive and with active ROM of the shoulder. Physiologic grip  strengths. Symmetrical radial pulses.  Neurological: He is alert.  Skin: Skin is warm and dry.  Nursing note and vitals reviewed.   ED Course  Procedures (including critical care time) Labs Review Labs Reviewed - No data to display  Imaging Review Dg Shoulder Left  08/25/2014   CLINICAL DATA:  Left shoulder pain for 4 days.  No known injury.  EXAM: LEFT SHOULDER - 2+ VIEW  COMPARISON:  None.  FINDINGS: There is no evidence of fracture or dislocation. There is no evidence of arthropathy or other focal bone abnormality. Soft tissues are  unremarkable.  IMPRESSION: Negative.   Electronically Signed   By: Myles RosenthalJohn  Stahl M.D.   On: 08/25/2014 12:29     EKG Interpretation None      MDM   Final diagnoses:  Secondary hypertension, unspecified  Left shoulder pain   Author: Gwendolyn FillMichelle A Simonetti Service: Vascular Lab Author Type: Cardiovascular Sonographer    Filed: 08/25/2014 12:38 PM Note Time: 08/25/2014 12:38 PM Status: Signed   Editor: Lawrence MarseillesMichelle A Simonetti (Cardiovascular Sonographer)     Expand All Collapse All   *Preliminary Results* Left upper extremity venous duplex completed. Left upper extremity is negative for deep and superficial vein thrombosis.  08/25/2014 12:38 PM  Gertie FeyMichelle Simonetti, RVT, RDCS, RDMS      The patients xray does not show any abnormalities. It is noted that the patients blood pressure is elevated, the PA considered atypical ACS or cardiac symptoms however the patients pain is only reproducible by shoulder movement and he has complete resolution of pain with rest.  Medications  acetaminophen (TYLENOL) tablet 650 mg (650 mg Oral Given 08/25/14 1113)  metoprolol tartrate (LOPRESSOR) tablet 50 mg (50 mg Oral Given 08/25/14 1156)    Pt given a dose of Lopressor which he is supposed to be on at home. The tylenol has helped his pain. I will give a referral to Ortho.  34 y.o.Samuel MurphyWalter D Desautel's evaluation in the Emergency Department is complete. It has been determined that no acute conditions requiring further emergency intervention are present at this time. The patient/guardian have been advised of the diagnosis and plan. We have discussed signs and symptoms that warrant return to the ED, such as changes or worsening in symptoms.  Vital signs are stable at discharge. Filed Vitals:   08/25/14 1045  BP: 191/127  Pulse: 104  Temp:   Resp:     Patient/guardian has voiced understanding and agreed to follow-up with the PCP or specialist.     Dorthula Matasiffany G Adra Shepler, PA-C 08/25/14 1316  Richardean Canalavid H Yao,  MD 08/26/14 1239

## 2014-08-25 NOTE — Progress Notes (Signed)
*  Preliminary Results* Left upper extremity venous duplex completed. Left upper extremity is negative for deep and superficial vein thrombosis.  08/25/2014 12:38 PM  Gertie FeyMichelle Mykhia Danish, RVT, RDCS, RDMS

## 2014-08-25 NOTE — ED Notes (Signed)
Patient states he started having Lt arm pain 3 days ago. Patient states, " I dont know what happen to it, I didn't hit it on anything". Patient states pain is intermitted and states pain 8/10 now.

## 2015-03-01 ENCOUNTER — Emergency Department (HOSPITAL_COMMUNITY)
Admission: EM | Admit: 2015-03-01 | Discharge: 2015-03-01 | Disposition: A | Discharge status: Home / Self Care | Payer: Medicare HMO | Attending: Emergency Medicine | Admitting: Emergency Medicine

## 2015-03-01 ENCOUNTER — Emergency Department (HOSPITAL_COMMUNITY): Payer: Medicare HMO

## 2015-03-01 ENCOUNTER — Encounter (HOSPITAL_COMMUNITY): Payer: Self-pay | Admitting: Emergency Medicine

## 2015-03-01 DIAGNOSIS — G510 Bell's palsy: Secondary | ICD-10-CM | POA: Diagnosis not present

## 2015-03-01 DIAGNOSIS — R079 Chest pain, unspecified: Secondary | ICD-10-CM | POA: Diagnosis present

## 2015-03-01 DIAGNOSIS — R0789 Other chest pain: Secondary | ICD-10-CM | POA: Diagnosis not present

## 2015-03-01 DIAGNOSIS — R200 Anesthesia of skin: Secondary | ICD-10-CM | POA: Insufficient documentation

## 2015-03-01 DIAGNOSIS — J45909 Unspecified asthma, uncomplicated: Secondary | ICD-10-CM | POA: Diagnosis not present

## 2015-03-01 DIAGNOSIS — I10 Essential (primary) hypertension: Secondary | ICD-10-CM | POA: Insufficient documentation

## 2015-03-01 DIAGNOSIS — H53149 Visual discomfort, unspecified: Secondary | ICD-10-CM | POA: Insufficient documentation

## 2015-03-01 DIAGNOSIS — F419 Anxiety disorder, unspecified: Secondary | ICD-10-CM | POA: Insufficient documentation

## 2015-03-01 DIAGNOSIS — Z79899 Other long term (current) drug therapy: Secondary | ICD-10-CM | POA: Diagnosis not present

## 2015-03-01 DIAGNOSIS — I160 Hypertensive urgency: Secondary | ICD-10-CM

## 2015-03-01 LAB — COMPREHENSIVE METABOLIC PANEL
ALBUMIN: 3.8 g/dL (ref 3.5–5.0)
ALK PHOS: 89 U/L (ref 38–126)
ALT: 32 U/L (ref 17–63)
AST: 29 U/L (ref 15–41)
Anion gap: 7 (ref 5–15)
BUN: 6 mg/dL (ref 6–20)
CHLORIDE: 106 mmol/L (ref 101–111)
CO2: 24 mmol/L (ref 22–32)
CREATININE: 1.18 mg/dL (ref 0.61–1.24)
Calcium: 9 mg/dL (ref 8.9–10.3)
GFR calc non Af Amer: >60 mL/min (ref >60)
GLUCOSE: 123 mg/dL — AB (ref 65–99)
Potassium: 3.8 mmol/L (ref 3.5–5.1)
SODIUM: 137 mmol/L (ref 135–145)
Total Bilirubin: 0.3 mg/dL (ref 0.3–1.2)
Total Protein: 7.5 g/dL (ref 6.5–8.1)

## 2015-03-01 LAB — DIFFERENTIAL
BASOS ABS: 0 10*3/uL (ref 0.0–0.1)
BASOS PCT: 0 % (ref 0–1)
Eosinophils Absolute: 0.3 10*3/uL (ref 0.0–0.7)
Eosinophils Relative: 3 % (ref 0–5)
LYMPHS PCT: 34 % (ref 12–46)
Lymphs Abs: 3.3 10*3/uL (ref 0.7–4.0)
Monocytes Absolute: 0.4 10*3/uL (ref 0.1–1.0)
Monocytes Relative: 4 % (ref 3–12)
NEUTROS ABS: 5.6 10*3/uL (ref 1.7–7.7)
NEUTROS PCT: 59 % (ref 43–77)

## 2015-03-01 LAB — TROPONIN I
TROPONIN I: 0.04 ng/mL — AB (ref <0.031)
Troponin I: 0.04 ng/mL — ABNORMAL HIGH (ref <0.031)

## 2015-03-01 LAB — I-STAT CHEM 8, ED
BUN: 8 mg/dL (ref 6–20)
CHLORIDE: 105 mmol/L (ref 101–111)
CREATININE: 1.2 mg/dL (ref 0.61–1.24)
Calcium, Ion: 1.15 mmol/L (ref 1.12–1.23)
Glucose, Bld: 117 mg/dL — ABNORMAL HIGH (ref 65–99)
HEMATOCRIT: 51 % (ref 39.0–52.0)
HEMOGLOBIN: 17.3 g/dL — AB (ref 13.0–17.0)
POTASSIUM: 3.8 mmol/L (ref 3.5–5.1)
Sodium: 141 mmol/L (ref 135–145)
TCO2: 24 mmol/L (ref 0–100)

## 2015-03-01 LAB — CBC
HCT: 47.4 % (ref 39.0–52.0)
Hemoglobin: 15.3 g/dL (ref 13.0–17.0)
MCH: 27.4 pg (ref 26.0–34.0)
MCHC: 32.3 g/dL (ref 30.0–36.0)
MCV: 84.9 fL (ref 78.0–100.0)
PLATELETS: 287 10*3/uL (ref 150–400)
RBC: 5.58 MIL/uL (ref 4.22–5.81)
RDW: 14.3 % (ref 11.5–15.5)
WBC: 9.6 10*3/uL (ref 4.0–10.5)

## 2015-03-01 LAB — CBG MONITORING, ED: Glucose-Capillary: 71 mg/dL (ref 65–99)

## 2015-03-01 LAB — I-STAT TROPONIN, ED: Troponin i, poc: 0.06 ng/mL (ref 0.00–0.08)

## 2015-03-01 LAB — PROTIME-INR
INR: 1.07 (ref 0.00–1.49)
PROTHROMBIN TIME: 14.1 s (ref 11.6–15.2)

## 2015-03-01 LAB — APTT: APTT: 30 s (ref 24–37)

## 2015-03-01 MED ORDER — ASPIRIN 81 MG PO CHEW: 81.0000 mg | CHEWABLE_TABLET | Freq: Every day | ORAL | Status: DC

## 2015-03-01 MED ORDER — METOPROLOL TARTRATE 50 MG PO TABS: 50.0000 mg | ORAL_TABLET | Freq: Two times a day (BID) | ORAL | Status: DC

## 2015-03-01 MED ORDER — PREDNISONE 20 MG PO TABS: 60.0000 mg | ORAL_TABLET | Freq: Every day | ORAL | Status: AC

## 2015-03-01 MED ORDER — VALACYCLOVIR HCL 1 G PO TABS: 1000.0000 mg | ORAL_TABLET | Freq: Three times a day (TID) | ORAL | Status: AC

## 2015-03-01 MED ORDER — VALACYCLOVIR HCL 500 MG PO TABS
1000.0000 mg | ORAL_TABLET | Freq: Once | ORAL | Status: AC
  Administered 2015-03-01: 1000 mg via ORAL
  Filled 2015-03-01: qty 2

## 2015-03-01 MED ORDER — AMLODIPINE BESYLATE 5 MG PO TABS: 5.0000 mg | ORAL_TABLET | Freq: Every day | ORAL | Status: DC

## 2015-03-01 MED ORDER — PREDNISONE 20 MG PO TABS
60.0000 mg | ORAL_TABLET | Freq: Once | ORAL | Status: AC
  Administered 2015-03-01: 60 mg via ORAL
  Filled 2015-03-01: qty 3

## 2015-03-01 NOTE — ED Provider Notes (Signed)
CSN: [id number]     Arrival date & time 03/01/15  1359 History   First MD Initiated Contact with Patient 03/01/15 1646     Chief Complaint  Patient presents with  . stroke like symptoms   . Chest Pain     HPI  Patient presents with multiple complaints. It seems as though his primary concern is new right facial droop, dysphasia. Symptoms began about 28 hours ago, initially with numbness in the right face, soon thereafter the patient had numbness throughout the right side of his body, noticed his speech was changed. Those symptoms persisted today, and the patient has since developed chest pain, tightness, dyspnea. Patient is legally blind, but states his vision is worse as well. No recent medication changes, dietary changes, health changes. Patient has no history of stroke, does not smoke. Patient does acknowledge a history of hypertension. Blindness is secondary to presumed genetic effect.   Past Medical History  Diagnosis Date  . Asthma   . Hypertension   . Legally blind    History reviewed. No pertinent past surgical history. No family history on file. Social History  Substance Use Topics  . Smoking status: Never Smoker   . Smokeless tobacco: None  . Alcohol Use: Yes     Comment: occ    Review of Systems  Constitutional:       Per HPI, otherwise negative  HENT:       Per HPI, otherwise negative  Eyes: Positive for photophobia.  Respiratory:       Per HPI, otherwise negative  Cardiovascular:       Per HPI, otherwise negative  Gastrointestinal: Negative for vomiting.  Endocrine:       Negative aside from HPI  Genitourinary:       Neg aside from HPI   Musculoskeletal:       Per HPI, otherwise negative  Skin: Negative for color change.  Neurological: Positive for weakness and numbness. Negative for syncope.  Psychiatric/Behavioral: The patient is nervous/anxious.       Allergies  Morphine and related  Home Medications   Prior to Admission medications     Medication Sig Start Date End Date Taking? Authorizing Provider  acetaminophen (TYLENOL) 500 MG tablet Take 500 mg by mouth every 6 (six) hours as needed for moderate pain.    Historical Provider, MD  amLODipine (NORVASC) 5 MG tablet Take 1 tablet (5 mg total) by mouth daily. 06/15/14   Renne Crigler, PA-C  cyclobenzaprine (FLEXERIL) 10 MG tablet Take 1 tablet (10 mg total) by mouth 2 (two) times daily as needed for muscle spasms. 08/25/14   Tiffany Neva Seat, PA-C  metoprolol (LOPRESSOR) 50 MG tablet Take 1 tablet (50 mg total) by mouth 2 (two) times daily. 08/25/14   Marlon Pel, PA-C  Nutritional Supplements (ADULT NUTRITIONAL SUPPLEMENT PO) Take 1 Units by mouth 3 (three) times daily. Take 1 Hydroxycut gummy three times per day for weight loss.    Historical Provider, MD  OVER THE COUNTER MEDICATION Take 2 tablets by mouth as needed (pain).    Historical Provider, MD  PRESCRIPTION MEDICATION Place 1 drop into both eyes as needed (redness).    Historical Provider, MD   BP 168/96 mmHg  Pulse 77  Ht 6\' 1"  (1.854 m)  Wt 343 lb (155.584 kg)  BMI 45.26 kg/m2  SpO2 100% Physical Exam  Constitutional: He is oriented to person, place, and time. He appears well-developed. No distress.  Large young male sitting in bed, wearing sunglasses.  HENT:  Head: Normocephalic.    Eyes: Conjunctivae and EOM are normal.  Cardiovascular: Normal rate and regular rhythm.   Pulmonary/Chest: Effort normal. No stridor. No respiratory distress.  Abdominal: He exhibits no distension.  Musculoskeletal: He exhibits no edema.  Neurological: He is alert and oriented to person, place, and time. He displays no atrophy and no tremor. A cranial nerve deficit is present. No sensory deficit. He exhibits normal muscle tone. He displays no seizure activity. Coordination normal.  Skin: Skin is warm and dry.  Psychiatric: He has a normal mood and affect.  Nursing note and vitals reviewed.   ED Course  Procedures (including  critical care time) Labs Review Labs Reviewed  COMPREHENSIVE METABOLIC PANEL - Abnormal; Notable for the following:    Glucose, Bld 123 (*)    All other components within normal limits  TROPONIN I - Abnormal; Notable for the following:    Troponin I 0.04 (*)    All other components within normal limits  TROPONIN I - Abnormal; Notable for the following:    Troponin I 0.04 (*)    All other components within normal limits  I-STAT CHEM 8, ED - Abnormal; Notable for the following:    Glucose, Bld 117 (*)    Hemoglobin 17.3 (*)    All other components within normal limits  PROTIME-INR  APTT  CBC  DIFFERENTIAL  I-STAT TROPOININ, ED  CBG MONITORING, ED    Imaging Review Ct Head Wo Contrast  03/01/2015   CLINICAL DATA:  Right side face and hand numbness since yesterday.  EXAM: CT HEAD WITHOUT CONTRAST  TECHNIQUE: Contiguous axial images were obtained from the base of the skull through the vertex without intravenous contrast.  COMPARISON:  01/15/2014  FINDINGS: No acute intracranial abnormality. Specifically, no hemorrhage, hydrocephalus, mass lesion, acute infarction, or significant intracranial injury. No acute calvarial abnormality.  Mucosal thickening in the ethmoid air cells. No air-fluid levels. Mastoid air cells are clear.  IMPRESSION: No intracranial abnormality.  Chronic ethmoid sinusitis.   Electronically Signed   By: Charlett Nose M.D.   On: 03/01/2015 15:26   Mr Brain Wo Contrast (neuro Protocol)  03/01/2015   CLINICAL DATA:  34 year old male with right facial droop and slurred speech since noon yesterday. Initial encounter.  EXAM: MRI HEAD WITHOUT CONTRAST  TECHNIQUE: Multiplanar, multiecho pulse sequences of the brain and surrounding structures were obtained without intravenous contrast.  COMPARISON:  Head CT without contrast 1526 hr today and earlier.  FINDINGS: Large body habitus. Cerebral volume is normal. No restricted diffusion to suggest acute infarction. No midline shift, mass  effect, evidence of mass lesion, ventriculomegaly, extra-axial collection or acute intracranial hemorrhage. Cervicomedullary junction and pituitary are within normal limits. Major intracranial vascular flow voids are within normal limits.  Wallace Cullens and white matter signal appears within normal limits.  Visible internal auditory structures appear normal. Mastoids are clear. Mild paranasal sinus mucosal thickening, primarily in the ethmoids. Orbit and scalp soft tissues are within normal limits. Negative visualized cervical spine. Visualized bone marrow signal is within normal limits.  IMPRESSION: Negative noncontrast MRI appearance of the brain.   Electronically Signed   By: Odessa Fleming M.D.   On: 03/01/2015 20:39   I have personally reviewed and evaluated these images and lab results as part of my medical decision-making.   On repeat exam the patient is in no distress, no chest pain.   EKG Interpretation   Date/Time:  Tuesday March 01 2015 14:10:09 EDT Ventricular Rate:  87  PR Interval:  158 QRS Duration: 96 QT Interval:  380 QTC Calculation: 457 R Axis:   35 Text Interpretation:  Normal sinus rhythm with sinus arrhythmia ST \\T \ T  wave abnormality, consider inferolateral ischemia Abnormal ECG since last  tracing no significant change Confirmed by BELFI  MD, MELANIE ([id number]) on  03/01/2015 2:42:45 PM     11:27 PM Patient sleeping. He awakens easily, has no new complaints, has no ongoing chest pain or any other discomfort. He, his mother and companion present as we discussed all findings, including no evidence for stroke, but with minimal positive troponin. Patient has not been taking any blood pressure medication for a long time. Patient will be restarted on medication, aspirin, prednisone, Valtrex. She will follow-up with primary care, cardiology.  MDM   Final diagnoses:  Bell's palsy  Atypical chest pain  Hypertensive urgency   Patient presents one day after developing new right facial  numbness. Patient does have multiple complaints, but his primary concern seems to be his new facial asymmetry. Constellation of neurologic deficits are consistent with Bell's all the Patient also has elevated blood pressure, and given his medication noncompliance, there is some suspicion for ongoing strain, with minimally abnormal troponin, but unchanged ecg and no ongoing cp. Patient will f/u w PMD and cardiology.   Gerhard Munch, MD 03/01/15 2329

## 2015-03-01 NOTE — ED Notes (Signed)
Patient transported to MRI 

## 2015-03-01 NOTE — Discharge Instructions (Signed)
As discussed, it is very important that you take all medication as directed and follow-up with your physicians (primary care and cardiology)  Be sure to return here for any concerning changes in your condition.

## 2015-03-01 NOTE — ED Notes (Signed)
Dr. Lockwood notified of positive troponin  

## 2015-03-01 NOTE — ED Notes (Signed)
Patient states yesterday starting at noon he starting having R sided drooping of face.   Patient states had some slurred speech.   Patient complains of L arm numbness yesterday also.   Patient states the chest pain started about 0900 this morning with no radiation, but some SOB.

## 2015-09-05 ENCOUNTER — Encounter (HOSPITAL_COMMUNITY): Payer: Self-pay | Admitting: Emergency Medicine

## 2015-09-05 ENCOUNTER — Emergency Department (HOSPITAL_COMMUNITY)
Admission: EM | Admit: 2015-09-05 | Discharge: 2015-09-05 | Disposition: A | Discharge status: Home / Self Care | Payer: Medicare HMO | Attending: Emergency Medicine | Admitting: Emergency Medicine

## 2015-09-05 DIAGNOSIS — H6091 Unspecified otitis externa, right ear: Secondary | ICD-10-CM | POA: Insufficient documentation

## 2015-09-05 DIAGNOSIS — Z7982 Long term (current) use of aspirin: Secondary | ICD-10-CM | POA: Insufficient documentation

## 2015-09-05 DIAGNOSIS — Z7952 Long term (current) use of systemic steroids: Secondary | ICD-10-CM | POA: Diagnosis not present

## 2015-09-05 DIAGNOSIS — Z79899 Other long term (current) drug therapy: Secondary | ICD-10-CM | POA: Diagnosis not present

## 2015-09-05 DIAGNOSIS — H548 Legal blindness, as defined in USA: Secondary | ICD-10-CM | POA: Diagnosis not present

## 2015-09-05 DIAGNOSIS — J45909 Unspecified asthma, uncomplicated: Secondary | ICD-10-CM | POA: Insufficient documentation

## 2015-09-05 DIAGNOSIS — I10 Essential (primary) hypertension: Secondary | ICD-10-CM | POA: Insufficient documentation

## 2015-09-05 DIAGNOSIS — H9201 Otalgia, right ear: Secondary | ICD-10-CM | POA: Diagnosis present

## 2015-09-05 MED ORDER — CIPROFLOXACIN-HYDROCORTISONE 0.2-1 % OT SUSP
3.0000 [drp] | Freq: Two times a day (BID) | OTIC | Status: DC
  Administered 2015-09-05: 3 [drp] via OTIC
  Filled 2015-09-05: qty 10

## 2015-09-05 NOTE — Discharge Instructions (Signed)

## 2015-09-05 NOTE — ED Notes (Signed)
C/o R ear pain and drainage since Saturday.

## 2015-09-05 NOTE — ED Provider Notes (Signed)
CSN: [id number]     Arrival date & time 09/05/15  0039 History   First MD Initiated Contact with Patient 09/05/15 0407     Chief Complaint  Patient presents with  . Otalgia     Patient is a 35 y.o. male presenting with ear pain. The history is provided by the patient.  Otalgia Location:  Right Associated symptoms: ear discharge   Associated symptoms: no cough, no fever and no tinnitus    patient's had right ear pain for the last couple days. States his been some drainage. Occasionally has trouble hearing out of that side. States pressure will come and go. No fevers. No chills. No trauma. He has not been around anyone sick. No previous history of ear problems.  Past Medical History  Diagnosis Date  . Asthma   . Hypertension   . Legally blind    Past Surgical History  Procedure Laterality Date  . "groin surgery"     No family history on file. Social History  Substance Use Topics  . Smoking status: Never Smoker   . Smokeless tobacco: None  . Alcohol Use: Yes     Comment: occ    Review of Systems  Constitutional: Negative for fever and appetite change.  HENT: Positive for ear discharge and ear pain. Negative for sinus pressure and tinnitus.   Eyes: Negative for photophobia.  Respiratory: Negative for cough and shortness of breath.   Cardiovascular: Negative for chest pain.      Allergies  Morphine and related  Home Medications   Prior to Admission medications   Medication Sig Start Date End Date Taking? Authorizing Provider  amLODipine (NORVASC) 5 MG tablet Take 1 tablet (5 mg total) by mouth daily. 03/01/15   Gerhard Munchobert Lockwood, MD  aspirin 81 MG chewable tablet Chew 1 tablet (81 mg total) by mouth daily. 03/01/15   Gerhard Munchobert Lockwood, MD  metoprolol (LOPRESSOR) 50 MG tablet Take 1 tablet (50 mg total) by mouth 2 (two) times daily. 03/01/15   Gerhard Munchobert Lockwood, MD  olopatadine (PATANOL) 0.1 % ophthalmic solution Place 1 drop into both eyes 2 (two) times daily.    Historical  Provider, MD  prednisoLONE acetate (PRED FORTE) 1 % ophthalmic suspension Place 1 drop into both eyes 2 (two) times daily.    Historical Provider, MD   BP 170/66 mmHg  Pulse 81  Temp(Src) 98.9 F (37.2 C) (Oral)  Resp 19  Ht 6\' 1"  (1.854 m)  Wt 321 lb (145.605 kg)  BMI 42.36 kg/m2  SpO2 99% Physical Exam  Constitutional: He appears well-developed.  HENT:  Bilateral TMs normal. Swelling of external canal on right ear with some drainage. No tenderness over mastoid. Some pain with pulling on the external ear.   Neck: Neck supple.  Cardiovascular: Normal rate and regular rhythm.   Pulmonary/Chest: Effort normal.  Musculoskeletal: He exhibits no edema.    ED Course  Procedures (including critical care time) Labs Review Labs Reviewed - No data to display  Imaging Review No results found. I have personally reviewed and evaluated these images and lab results as part of my medical decision-making.   EKG Interpretation None      MDM   Final diagnoses:  Otitis externa, right    Patient with otitis externa on right. With placed and can be removed in 2 days either by patient or may need to see someone. Continue the drops for 1 week and will discharge home.    Benjiman CoreNathan Larrell Rapozo, MD 09/05/15 [phone]

## 2015-09-14 DIAGNOSIS — H18603 Keratoconus, unspecified, bilateral: Secondary | ICD-10-CM | POA: Diagnosis not present

## 2015-09-14 DIAGNOSIS — H179 Unspecified corneal scar and opacity: Secondary | ICD-10-CM | POA: Diagnosis not present

## 2015-10-28 DIAGNOSIS — H18603 Keratoconus, unspecified, bilateral: Secondary | ICD-10-CM | POA: Diagnosis not present

## 2015-10-28 DIAGNOSIS — R0683 Snoring: Secondary | ICD-10-CM | POA: Diagnosis not present

## 2015-10-28 DIAGNOSIS — H179 Unspecified corneal scar and opacity: Secondary | ICD-10-CM | POA: Diagnosis not present

## 2015-10-28 DIAGNOSIS — Z7689 Persons encountering health services in other specified circumstances: Secondary | ICD-10-CM | POA: Insufficient documentation

## 2015-10-28 DIAGNOSIS — Z01812 Encounter for preprocedural laboratory examination: Secondary | ICD-10-CM | POA: Diagnosis not present

## 2015-10-28 DIAGNOSIS — I10 Essential (primary) hypertension: Secondary | ICD-10-CM | POA: Diagnosis not present

## 2015-10-28 DIAGNOSIS — Z6841 Body Mass Index (BMI) 40.0 and over, adult: Secondary | ICD-10-CM | POA: Diagnosis not present

## 2015-11-03 DIAGNOSIS — M109 Gout, unspecified: Secondary | ICD-10-CM | POA: Diagnosis not present

## 2015-11-03 DIAGNOSIS — Z6841 Body Mass Index (BMI) 40.0 and over, adult: Secondary | ICD-10-CM | POA: Diagnosis not present

## 2015-11-03 DIAGNOSIS — H18609 Keratoconus, unspecified, unspecified eye: Secondary | ICD-10-CM | POA: Diagnosis not present

## 2015-11-03 DIAGNOSIS — J45909 Unspecified asthma, uncomplicated: Secondary | ICD-10-CM | POA: Diagnosis not present

## 2015-11-03 DIAGNOSIS — R69 Illness, unspecified: Secondary | ICD-10-CM | POA: Diagnosis not present

## 2015-11-03 DIAGNOSIS — H18602 Keratoconus, unspecified, left eye: Secondary | ICD-10-CM | POA: Diagnosis not present

## 2015-11-03 DIAGNOSIS — Z716 Tobacco abuse counseling: Secondary | ICD-10-CM | POA: Diagnosis not present

## 2015-11-03 DIAGNOSIS — G4733 Obstructive sleep apnea (adult) (pediatric): Secondary | ICD-10-CM | POA: Diagnosis not present

## 2015-11-03 DIAGNOSIS — I10 Essential (primary) hypertension: Secondary | ICD-10-CM | POA: Diagnosis not present

## 2016-01-20 ENCOUNTER — Emergency Department (HOSPITAL_COMMUNITY)
Admission: EM | Admit: 2016-01-20 | Discharge: 2016-01-20 | Disposition: A | Discharge status: Home / Self Care | Payer: Medicare HMO | Attending: Emergency Medicine | Admitting: Emergency Medicine

## 2016-01-20 ENCOUNTER — Encounter (HOSPITAL_COMMUNITY): Payer: Self-pay | Admitting: Emergency Medicine

## 2016-01-20 DIAGNOSIS — J45909 Unspecified asthma, uncomplicated: Secondary | ICD-10-CM | POA: Diagnosis not present

## 2016-01-20 DIAGNOSIS — I10 Essential (primary) hypertension: Secondary | ICD-10-CM | POA: Diagnosis not present

## 2016-01-20 DIAGNOSIS — Z7982 Long term (current) use of aspirin: Secondary | ICD-10-CM | POA: Diagnosis not present

## 2016-01-20 DIAGNOSIS — J020 Streptococcal pharyngitis: Secondary | ICD-10-CM | POA: Insufficient documentation

## 2016-01-20 DIAGNOSIS — Z79899 Other long term (current) drug therapy: Secondary | ICD-10-CM | POA: Diagnosis not present

## 2016-01-20 DIAGNOSIS — J029 Acute pharyngitis, unspecified: Secondary | ICD-10-CM | POA: Diagnosis present

## 2016-01-20 HISTORY — DX: Keratoconus, unspecified, bilateral: H18.603

## 2016-01-20 LAB — RAPID STREP SCREEN (MED CTR MEBANE ONLY): Streptococcus, Group A Screen (Direct): POSITIVE — AB

## 2016-01-20 MED ORDER — KETOROLAC TROMETHAMINE 60 MG/2ML IM SOLN
60.0000 mg | Freq: Once | INTRAMUSCULAR | Status: AC
  Administered 2016-01-20: 60 mg via INTRAMUSCULAR
  Filled 2016-01-20: qty 2

## 2016-01-20 MED ORDER — TETRACAINE HCL 0.5 % OP SOLN
2.0000 [drp] | Freq: Once | OPHTHALMIC | Status: AC
  Administered 2016-01-20: 2 [drp] via OPHTHALMIC
  Filled 2016-01-20: qty 4

## 2016-01-20 MED ORDER — DEXAMETHASONE SODIUM PHOSPHATE 10 MG/ML IJ SOLN
10.0000 mg | Freq: Once | INTRAMUSCULAR | Status: AC
  Administered 2016-01-20: 10 mg via INTRAMUSCULAR
  Filled 2016-01-20: qty 1

## 2016-01-20 MED ORDER — AMOXICILLIN-POT CLAVULANATE 875-125 MG PO TABS: 1.0000 | ORAL_TABLET | Freq: Two times a day (BID) | ORAL | Status: DC

## 2016-01-20 NOTE — ED Notes (Addendum)
Per pt, states has had cold symptoms for a week, sore throat, congestions-SOB which started this am due to increased congestion

## 2016-01-20 NOTE — Progress Notes (Signed)
ED CM consulted by ED PA/NP Abiligail to offer pt a pcp Pt has [employer]are and the response hx in EPIC indicates pt has [facility] as a provider for pcp services [phone]

## 2016-01-20 NOTE — Discharge Instructions (Signed)
Strep Throat °Strep throat is a bacterial infection of the throat. Your health care provider may call the infection tonsillitis or pharyngitis, depending on whether there is swelling in the tonsils or at the back of the throat. Strep throat is most common during the cold months of the year in children who are 5-33 years of age, but it can happen during any season in people of any age. This infection is spread from person to person (contagious) through coughing, sneezing, or close contact. °CAUSES °Strep throat is caused by the bacteria called Streptococcus pyogenes. °RISK FACTORS °This condition is more likely to develop in: °· People who spend time in crowded places where the infection can spread easily. °· People who have close contact with someone who has strep throat. °SYMPTOMS °Symptoms of this condition include: °· Fever or chills.   °· Redness, swelling, or pain in the tonsils or throat. °· Pain or difficulty when swallowing. °· White or yellow spots on the tonsils or throat. °· Swollen, tender glands in the neck or under the jaw. °· Red rash all over the body (rare). °DIAGNOSIS °This condition is diagnosed by performing a rapid strep test or by taking a swab of your throat (throat culture test). Results from a rapid strep test are usually ready in a few minutes, but throat culture test results are available after one or two days. °TREATMENT °This condition is treated with antibiotic medicine. °HOME CARE INSTRUCTIONS °Medicines °· Take over-the-counter and prescription medicines only as told by your health care provider. °· Take your antibiotic as told by your health care provider. Do not stop taking the antibiotic even if you start to feel better. °· Have family members who also have a sore throat or fever tested for strep throat. They may need antibiotics if they have the strep infection. °Eating and Drinking °· Do not share food, drinking cups, or personal items that could cause the infection to spread to  other people. °· If swallowing is difficult, try eating soft foods until your sore throat feels better. °· Drink enough fluid to keep your urine clear or pale yellow. °General Instructions °· Gargle with a salt-water mixture 3-4 times per day or as needed. To make a salt-water mixture, completely dissolve ½-1 tsp of salt in 1 cup of warm water. °· Make sure that all household members wash their hands well. °· Get plenty of rest. °· Stay home from school or work until you have been taking antibiotics for 24 hours. °· Keep all follow-up visits as told by your health care provider. This is important. °SEEK MEDICAL CARE IF: °· The glands in your neck continue to get bigger. °· You develop a rash, cough, or earache. °· You cough up a thick liquid that is green, yellow-brown, or bloody. °· You have pain or discomfort that does not get better with medicine. °· Your problems seem to be getting worse rather than better. °· You have a fever. °SEEK IMMEDIATE MEDICAL CARE IF: °· You have new symptoms, such as vomiting, severe headache, stiff or painful neck, chest pain, or shortness of breath. °· You have severe throat pain, drooling, or changes in your voice. °· You have swelling of the neck, or the skin on the neck becomes red and tender. °· You have signs of dehydration, such as fatigue, dry mouth, and decreased urination. °· You become increasingly sleepy, or you cannot wake up completely. °· Your joints become red or painful. °  °This information is not intended to replace   advice given to you by your health care provider. Make sure you discuss any questions you have with your health care provider.   Document Released: 06/15/2000 Document Revised: 03/09/2015 Document Reviewed: 10/11/2014 Elsevier Interactive Patient Education 2016 Elsevier Inc.  Managing Your High Blood Pressure Blood pressure is a measurement of how forceful your blood is pressing against the walls of the arteries. Arteries are muscular tubes within  the circulatory system. Blood pressure does not stay the same. Blood pressure rises when you are active, excited, or nervous; and it lowers during sleep and relaxation. If the numbers measuring your blood pressure stay above normal most of the time, you are at risk for health problems. High blood pressure (hypertension) is a long-term (chronic) condition in which blood pressure is elevated. A blood pressure reading is recorded as two numbers, such as 120 over 80 (or 120/80). The first, higher number is called the systolic pressure. It is a measure of the pressure in your arteries as the heart beats. The second, lower number is called the diastolic pressure. It is a measure of the pressure in your arteries as the heart relaxes between beats.  Keeping your blood pressure in a normal range is important to your overall health and prevention of health problems, such as heart disease and stroke. When your blood pressure is uncontrolled, your heart has to work harder than normal. High blood pressure is a very common condition in adults because blood pressure tends to rise with age. Men and women are equally likely to have hypertension but at different times in life. Before age 33, men are more likely to have hypertension. After 33 years of age, women are more likely to have it. Hypertension is especially common in African Americans. This condition often has no signs or symptoms. The cause of the condition is usually not known. Your caregiver can help you come up with a plan to keep your blood pressure in a normal, healthy range. BLOOD PRESSURE STAGES Blood pressure is classified into four stages: normal, prehypertension, stage 1, and stage 2. Your blood pressure reading will be used to determine what type of treatment, if any, is necessary. Appropriate treatment options are tied to these four stages:  Normal  Systolic pressure (mm Hg): below 120.  Diastolic pressure (mm Hg): below 80. Prehypertension  Systolic  pressure (mm Hg): 120 to 139.  Diastolic pressure (mm Hg): 80 to 89. Stage1  Systolic pressure (mm Hg): 140 to 159.  Diastolic pressure (mm Hg): 90 to 99. Stage2  Systolic pressure (mm Hg): 160 or above.  Diastolic pressure (mm Hg): 100 or above. RISKS RELATED TO HIGH BLOOD PRESSURE Managing your blood pressure is an important responsibility. Uncontrolled high blood pressure can lead to:  A heart attack.  A stroke.  A weakened blood vessel (aneurysm).  Heart failure.  Kidney damage.  Eye damage.  Metabolic syndrome.  Memory and concentration problems. HOW TO MANAGE YOUR BLOOD PRESSURE Blood pressure can be managed effectively with lifestyle changes and medicines (if needed). Your caregiver will help you come up with a plan to bring your blood pressure within a normal range. Your plan should include the following: Education  Read all information provided by your caregivers about how to control blood pressure.  Educate yourself on the latest guidelines and treatment recommendations. New research is always being done to further define the risks and treatments for high blood pressure. Lifestylechanges  Control your weight.  Avoid smoking.  Stay physically active.  Reduce the amount  of salt in your diet.  Reduce stress.  Control any chronic conditions, such as high cholesterol or diabetes.  Reduce your alcohol intake. Medicines  Several medicines (antihypertensive medicines) are available, if needed, to bring blood pressure within a normal range. Communication  Review all the medicines you take with your caregiver because there may be side effects or interactions.  Talk with your caregiver about your diet, exercise habits, and other lifestyle factors that may be contributing to high blood pressure.  See your caregiver regularly. Your caregiver can help you create and adjust your plan for managing high blood pressure. RECOMMENDATIONS FOR TREATMENT AND  FOLLOW-UP  The following recommendations are based on current guidelines for managing high blood pressure in nonpregnant adults. Use these recommendations to identify the proper follow-up period or treatment option based on your blood pressure reading. You can discuss these options with your caregiver.  Systolic pressure of 120 to 139 or diastolic pressure of 80 to 89: Follow up with your caregiver as directed.  Systolic pressure of 140 to 160 or diastolic pressure of 90 to 100: Follow up with your caregiver within 2 months.  Systolic pressure above 160 or diastolic pressure above 100: Follow up with your caregiver within 1 month.  Systolic pressure above 180 or diastolic pressure above 110: Consider antihypertensive therapy; follow up with your caregiver within 1 week.  Systolic pressure above 200 or diastolic pressure above 120: Begin antihypertensive therapy; follow up with your caregiver within 1 week.   This information is not intended to replace advice given to you by your health care provider. Make sure you discuss any questions you have with your health care provider.   Document Released: 03/12/2012 Document Reviewed: 03/12/2012 Elsevier Interactive Patient Education 2016 Elsevier Inc. Amoxicillin; Clavulanic Acid tablets What is this medicine? AMOXICILLIN; CLAVULANIC ACID (a mox i SIL in; KLAV yoo lan ic AS id) is a penicillin antibiotic. It is used to treat certain kinds of bacterial infections. It will not work for colds, flu, or other viral infections. This medicine may be used for other purposes; ask your health care provider or pharmacist if you have questions. What should I tell my health care provider before I take this medicine? They need to know if you have any of these conditions: -bowel disease, like colitis -kidney disease -liver disease -mononucleosis -an unusual or allergic reaction to amoxicillin, penicillin, cephalosporin, other antibiotics, clavulanic acid, other  medicines, foods, dyes, or preservatives -pregnant or trying to get pregnant -breast-feeding How should I use this medicine? Take this medicine by mouth with a full glass of water. Follow the directions on the prescription label. Take at the start of a meal. Do not crush or chew. If the tablet has a score line, you may cut it in half at the score line for easier swallowing. Take your medicine at regular intervals. Do not take your medicine more often than directed. Take all of your medicine as directed even if you think you are better. Do not skip doses or stop your medicine early. Talk to your pediatrician regarding the use of this medicine in children. Special care may be needed. Overdosage: If you think you have taken too much of this medicine contact a poison control center or emergency room at once. NOTE: This medicine is only for you. Do not share this medicine with others. What if I miss a dose? If you miss a dose, take it as soon as you can. If it is almost time for your next  dose, take only that dose. Do not take double or extra doses. What may interact with this medicine? -allopurinol -anticoagulants -birth control pills -methotrexate -probenecid This list may not describe all possible interactions. Give your health care provider a list of all the medicines, herbs, non-prescription drugs, or dietary supplements you use. Also tell them if you smoke, drink alcohol, or use illegal drugs. Some items may interact with your medicine. What should I watch for while using this medicine? Tell your doctor or health care professional if your symptoms do not improve. Do not treat diarrhea with over the counter products. Contact your doctor if you have diarrhea that lasts more than 2 days or if it is severe and watery. If you have diabetes, you may get a false-positive result for sugar in your urine. Check with your doctor or health care professional. Birth control pills may not work properly while  you are taking this medicine. Talk to your doctor about using an extra method of birth control. What side effects may I notice from receiving this medicine? Side effects that you should report to your doctor or health care professional as soon as possible: -allergic reactions like skin rash, itching or hives, swelling of the face, lips, or tongue -breathing problems -dark urine -fever or chills, sore throat -redness, blistering, peeling or loosening of the skin, including inside the mouth -seizures -trouble passing urine or change in the amount of urine -unusual bleeding, bruising -unusually weak or tired -white patches or sores in the mouth or throat Side effects that usually do not require medical attention (report to your doctor or health care professional if they continue or are bothersome): -diarrhea -dizziness -headache -nausea, vomiting -stomach upset -vaginal or anal irritation This list may not describe all possible side effects. Call your doctor for medical advice about side effects. You may report side effects to FDA at 1-800-FDA-1088. Where should I keep my medicine? Keep out of the reach of children. Store at room temperature below 25 degrees C (77 degrees F). Keep container tightly closed. Throw away any unused medicine after the expiration date. NOTE: This sheet is a summary. It may not cover all possible information. If you have questions about this medicine, talk to your doctor, pharmacist, or health care provider.    2016, Elsevier/Gold Standard. (2007-09-11 12:04:30)

## 2016-01-20 NOTE — Progress Notes (Signed)
Entered in d/c instructions  Corine ShelterKilpatrick, George Call on 01/23/2016 A message has been left at this doctor office requesting a call to you to assist with follow up care PLEASE CALL THIS OFFICE on 01/23/16 This is your doctor assigned to you by [place] [place] medicare noted in our records at [facility] [address] [place] [id number] [phone]

## 2016-01-20 NOTE — ED Notes (Signed)
Oropharynx is erythematous, no exudate noted.  Bilateral injection noted to sclera.  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  He states his eyes are "always red."  Patient had a f/u appt scheduled with opthalmologist today, but missed his appt.  Patient has no opthalmic complaints today.

## 2016-01-20 NOTE — ED Provider Notes (Signed)
CSN: [id number]     Arrival date & time 01/20/16  1312 History  By signing my name below, I, [facility], attest that this documentation has been prepared under the direction and in the presence of Arthor Captain, PA-C. Electronically Signed: [name], ED Scribe. 01/20/2016. 3:38 PM.    Chief Complaint  Patient presents with  . Sore Throat  . Nasal Congestion    The history is provided by the patient. No language interpreter was used.   Samuel Decker is a 35 y.o. male with a hx of HTN, asthma, and keratoconus of bilateral eyes who presents to the Emergency Department complaining of constant, moderate, left-sided sore throat. Pt states that he has had a sore throat for the past 5 days that improved 3 days ago and worsened yesterday with associated left-sided nasal congestion and difficulty swallowing secondary to pain. Sore throat is worse in the morning. He is allergic to morphine. He has both Medicaid and Medicare. Per pt, he has been stressed recently due to ongoing treatment for his keratoconus with left cornea transplant and the anniversary of his father's death. He notes that he missed his opthalmologist appointment this morning and has a follow-up appointment on 02/08/16. Denies having a PCP currently. Denies inability to swallow secretion, eye pain different from baseline, or fever.  Past Medical History  Diagnosis Date  . Hypertension   . Legally blind   . Asthma   . Keratoconus of both eyes    Past Surgical History  Procedure Laterality Date  . "groin surgery"    . Eye surgery     No family history on file. Social History  Substance Use Topics  . Smoking status: Never Smoker   . Smokeless tobacco: None  . Alcohol Use: Yes     Comment: occ    Review of Systems  Constitutional: Negative for fever.  HENT: Positive for congestion, sore throat and trouble swallowing.   Eyes: Positive for pain (baseline).      Allergies  Morphine and  related  Home Medications   Prior to Admission medications   Medication Sig Start Date End Date Taking? Authorizing Provider  amLODipine (NORVASC) 5 MG tablet Take 1 tablet (5 mg total) by mouth daily. 03/01/15   Gerhard Munch, MD  amoxicillin-clavulanate (AUGMENTIN) 875-125 MG tablet Take 1 tablet by mouth 2 (two) times daily. One po bid x 7 days 01/20/16   Arthor Captain, PA-C  aspirin 81 MG chewable tablet Chew 1 tablet (81 mg total) by mouth daily. 03/01/15   Gerhard Munch, MD  metoprolol (LOPRESSOR) 50 MG tablet Take 1 tablet (50 mg total) by mouth 2 (two) times daily. 03/01/15   Gerhard Munch, MD  olopatadine (PATANOL) 0.1 % ophthalmic solution Place 1 drop into both eyes 2 (two) times daily.    Historical Provider, MD  prednisoLONE acetate (PRED FORTE) 1 % ophthalmic suspension Place 1 drop into both eyes 2 (two) times daily.    Historical Provider, MD   BP 184/111 mmHg  Pulse 65  Temp(Src) 98.3 F (36.8 C) (Oral)  Resp 19  Ht  (1.854 m)  Wt 315 lb (142.883 kg)  BMI 41.57 kg/m2  SpO2 100% Physical Exam  Constitutional: He is oriented to person, place, and time. He appears well-developed and well-nourished. No distress.  HENT:  Head: Normocephalic and atraumatic.  Bilateral tonsillar erythema. Mucus coating throat. Pharyngeal arch and uvula swollen.  Eyes: Conjunctivae are normal.  Pressure in left eye is 14. Unable  to test pressure in right eye due to a special contact lens in place that cannot be removed without special equipment.  Neck: Normal range of motion.  Cardiovascular: Normal rate.   Pulmonary/Chest: Effort normal. No respiratory distress.  Musculoskeletal: Normal range of motion.  Neurological: He is alert and oriented to person, place, and time.  Skin: Skin is warm and dry.  Psychiatric: He has a normal mood and affect. His behavior is normal.  Nursing note and vitals reviewed.   ED Course  Procedures   DIAGNOSTIC STUDIES: Oxygen Saturation is 98%  on RA, normal by my interpretation.    COORDINATION OF CARE: 2:26 PM Discussed results of rapid strep test. Ordered tetracaine opthalmic solution, applied to eye, and examined eye. Will order Decadron and Toradol. Will prescribe Augmentin. Will provide pt with a referral to a PCP. Advised pt to follow-up with ophthalmologist. Discussed treatment plan with pt at bedside and pt agreed to plan.   Labs Review Labs Reviewed  RAPID STRE[facility] SCREEN ([facility]) - Abnormal; Notable for the following:    Streptococcus, Group A Screen (Direct) POSITIVE (*)    All other components within normal limits   I have personally reviewed and evaluated these lab results as part of my medical decision-making.   MDM   Final diagnoses:  Strep pharyngitis  Essential hypertension    Pt rapid strep test positive. Pt is tolerating secretions. Presentation not concerning for peritonsillar abscess or spread of infection to deep spaces of the throat; patent airway. Pt will be discharged with prescription for Augmentin. Pressure appears normal in left eye. Pt notes that he has an appointment for follow-up with his ophthalmologist. Recommended PCP follow up. Care manager left a voicemail for his listed PCP for the pt to be seen for his poorly managed HTN. Specific return precautions discussed. Pt appears safe for discharge.   I personally performed the services described in this documentation, which was scribed in my presence. The recorded information has been reviewed and is accurate.     Arthor Captainbigail Sheriece Jefcoat, PA-C 01/20/16 1844   Leta BaptistEmily Roe Nguyen, MD 01/23/16 [phone]

## 2016-02-08 DIAGNOSIS — H18603 Keratoconus, unspecified, bilateral: Secondary | ICD-10-CM | POA: Diagnosis not present

## 2016-02-08 DIAGNOSIS — Z947 Corneal transplant status: Secondary | ICD-10-CM | POA: Diagnosis not present

## 2016-02-15 DIAGNOSIS — T86841 Corneal transplant failure: Secondary | ICD-10-CM | POA: Diagnosis not present

## 2016-02-15 DIAGNOSIS — H18603 Keratoconus, unspecified, bilateral: Secondary | ICD-10-CM | POA: Diagnosis not present

## 2016-02-15 DIAGNOSIS — Z947 Corneal transplant status: Secondary | ICD-10-CM | POA: Diagnosis not present

## 2016-02-24 DIAGNOSIS — H18602 Keratoconus, unspecified, left eye: Secondary | ICD-10-CM | POA: Diagnosis not present

## 2016-02-24 DIAGNOSIS — Z947 Corneal transplant status: Secondary | ICD-10-CM | POA: Diagnosis not present

## 2016-02-24 DIAGNOSIS — T86841 Corneal transplant failure: Secondary | ICD-10-CM | POA: Diagnosis not present

## 2016-02-24 DIAGNOSIS — H179 Unspecified corneal scar and opacity: Secondary | ICD-10-CM | POA: Diagnosis not present

## 2016-09-22 IMAGING — CT CT HEAD W/O CM
1 series · 16 of 30 positions shown, 20 images · non-contrast
Comparison: 01/15/2014

CLINICAL DATA: Right side face and hand numbness since yesterday.

EXAM:
CT HEAD WITHOUT CONTRAST
TECHNIQUE: Contiguous axial images were obtained from the base of the skull
through the vertex without intravenous contrast.

[Series 2: head 5.0 h30s · axial · 0.48mm/px · z∈[+1397,+1542]mm · 16 of 33 slices shown, 20 images]
[im 2/33  brain]
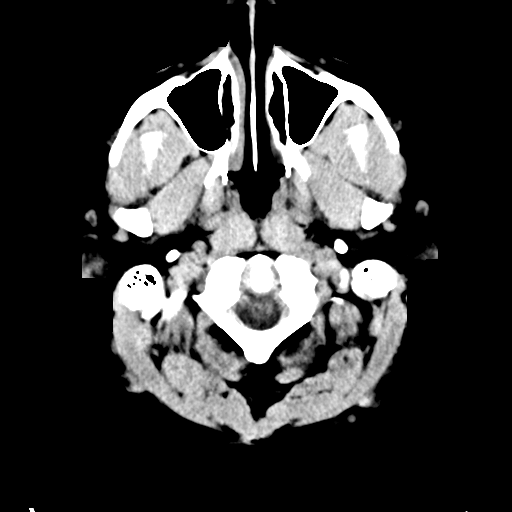
[im 2/33  bone]
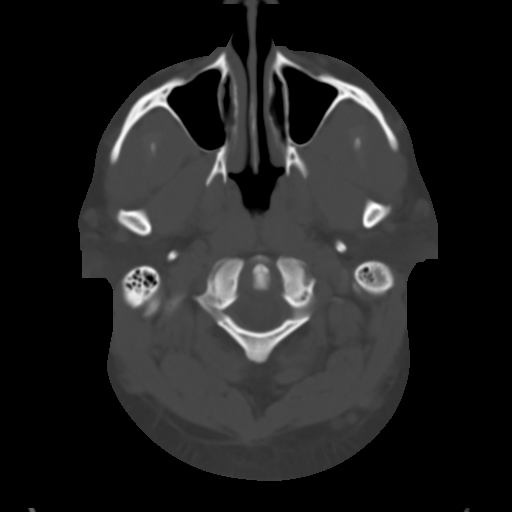
[im 4/33  brain]
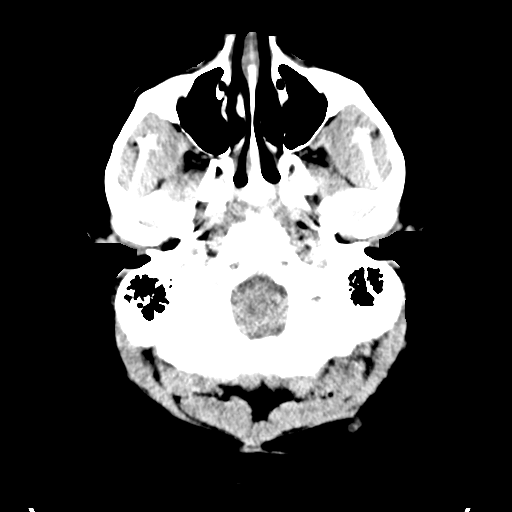
[im 6/33  brain]
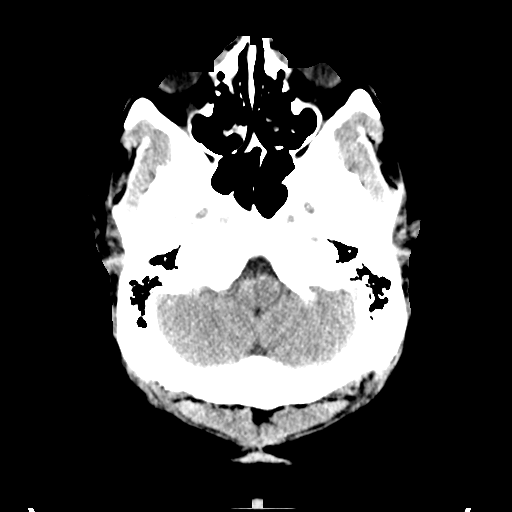
[im 8/33  brain]
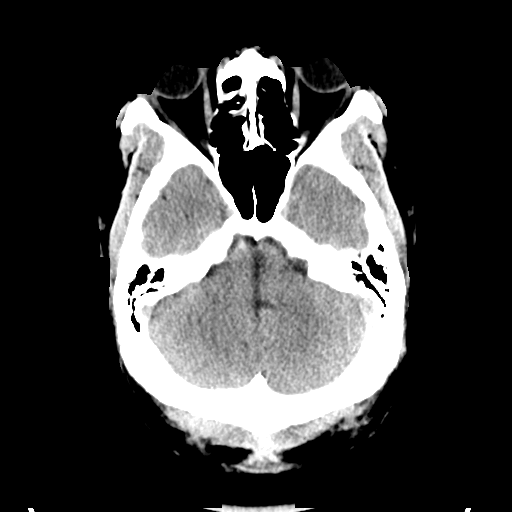
[im 9/33  brain]
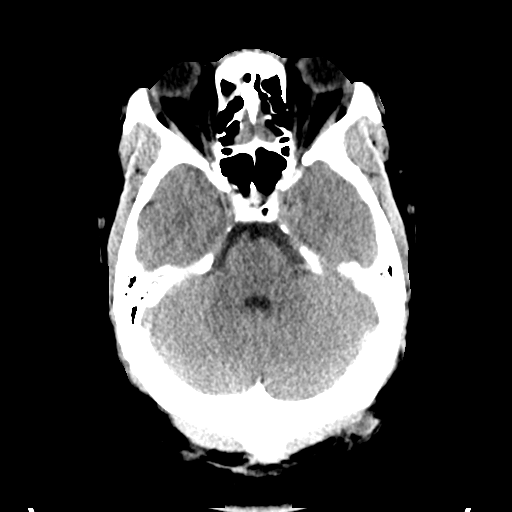
[im 9/33  bone]
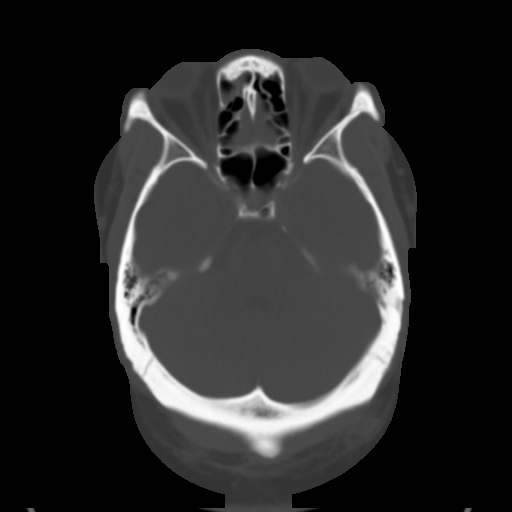
[im 12/33  brain]
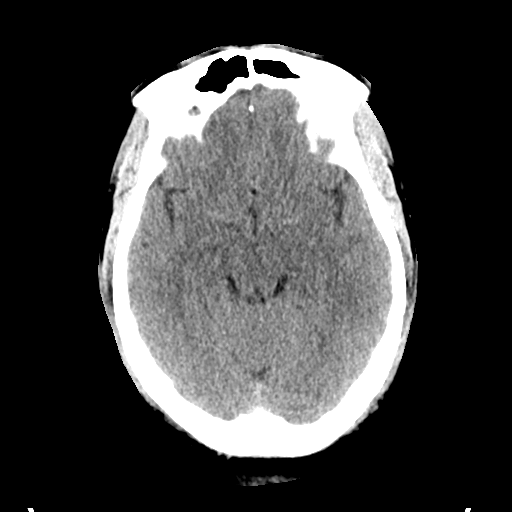
[im 14/33  brain]
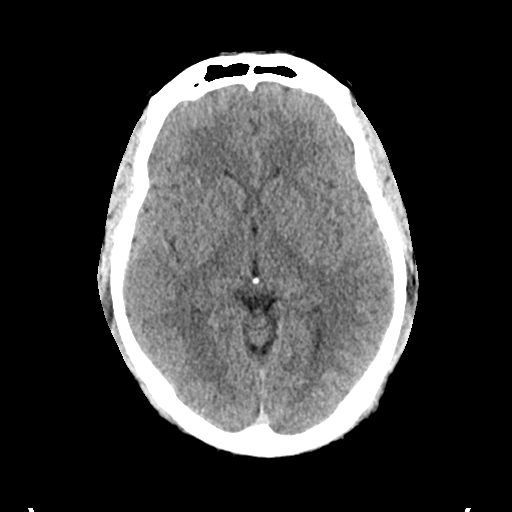
[im 16/33  brain]
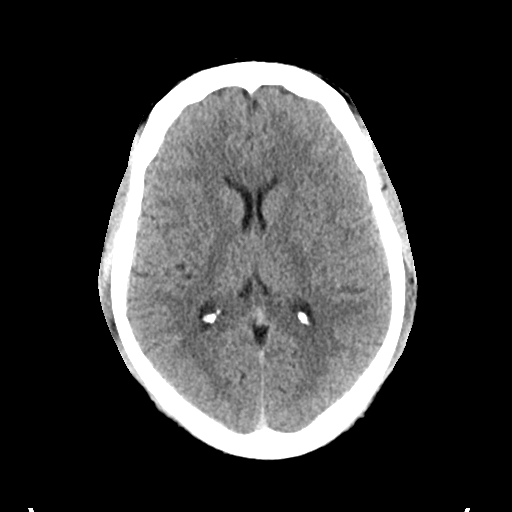
[im 17/33  brain]
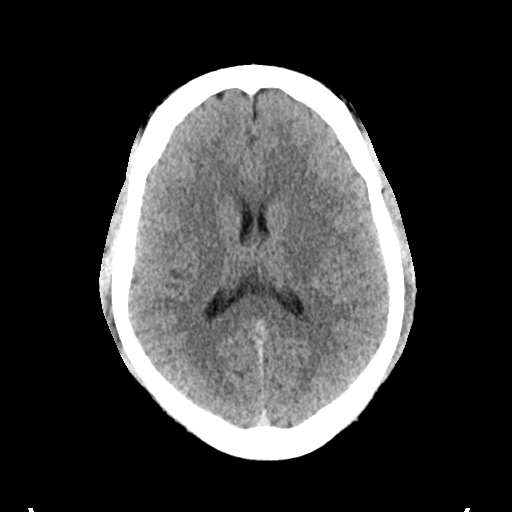
[im 17/33  bone]
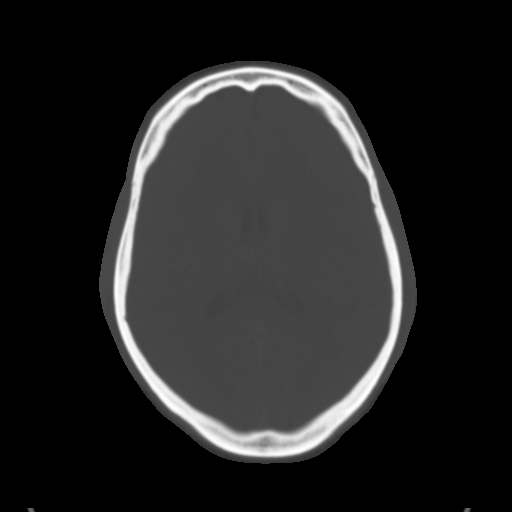
[im 19/33  brain]
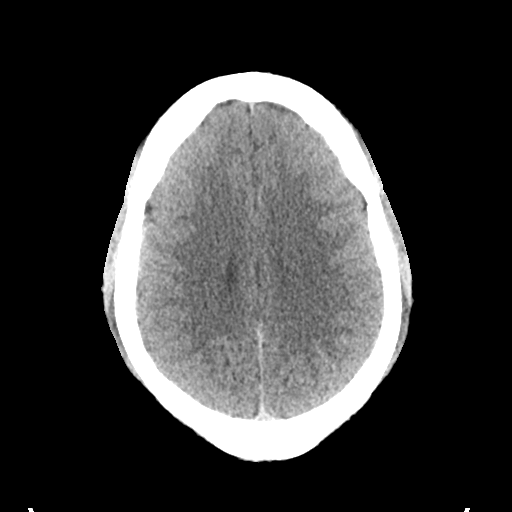
[im 21/33  brain]
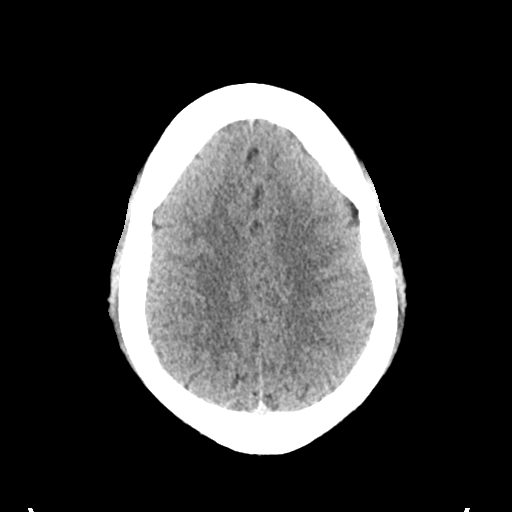
[im 24/33  brain]
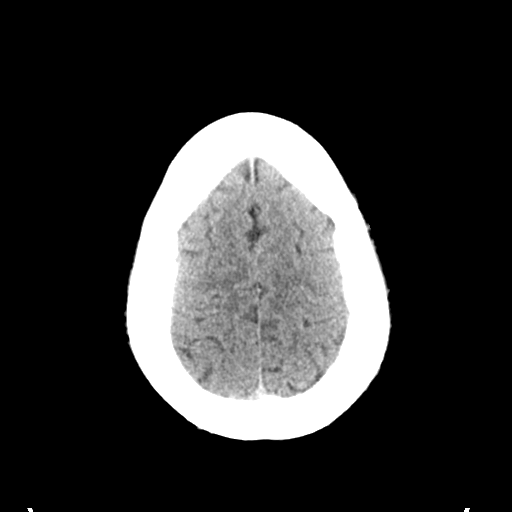
[im 25/33  brain]
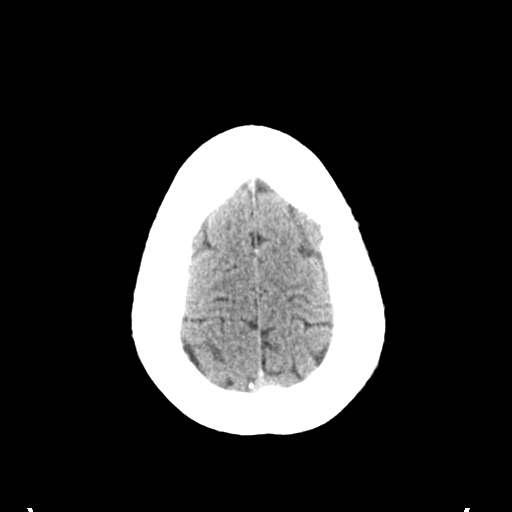
[im 25/33  bone]
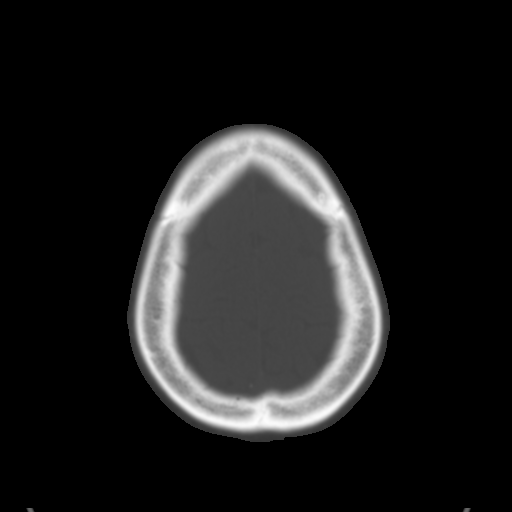
[im 27/33  brain]
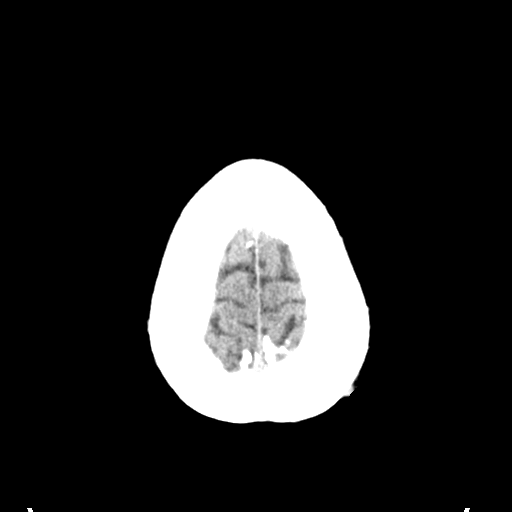
[im 29/33  brain]
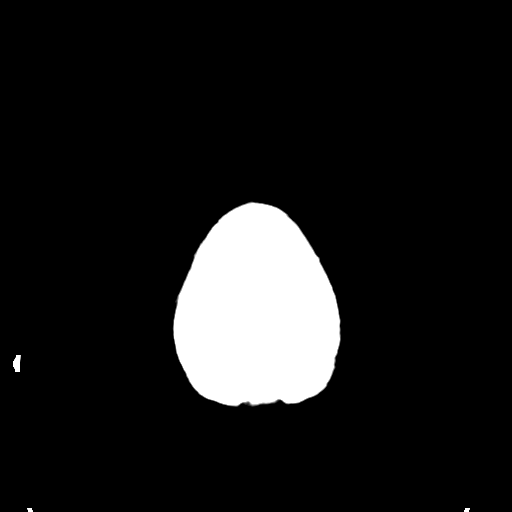
[im 31/33  brain]
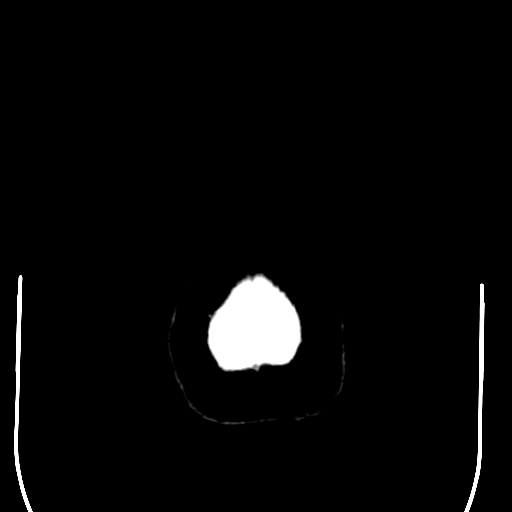

[16 of 30 positions shown; findings below may reference images not displayed]

FINDINGS: No acute intracranial abnormality. Specifically, no hemorrhage,
hydrocephalus, mass lesion, acute infarction, or significant
intracranial injury. No acute calvarial abnormality.

Mucosal thickening in the ethmoid air cells. No air-fluid levels.
Mastoid air cells are clear.
IMPRESSION: No intracranial abnormality.

Chronic ethmoid sinusitis.

## 2016-09-22 IMAGING — MR MR HEAD W/O CM
9 of 10 series · 34 of 48 positions shown · non-contrast
Comparison: Head CT without contrast 6213 hr today and earlier.

CLINICAL DATA: 34-year-old male with right facial droop and slurred
speech since noon yesterday. Initial encounter.

EXAM:
MRI HEAD WITHOUT CONTRAST
TECHNIQUE: Multiplanar, multiecho pulse sequences of the brain and surrounding
structures were obtained without intravenous contrast.

[Series 3: DWI · axial · 3.6mm · 0.94mm/px · z∈[-127,+26]mm · 8 of 88 slices shown (1 of 4)]
[im 1/88]
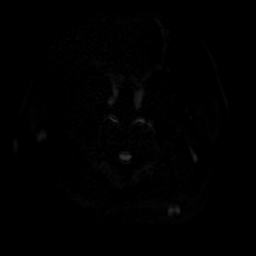
[im 10/88]
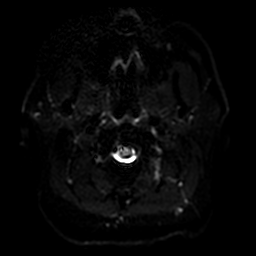
[im 30/88]
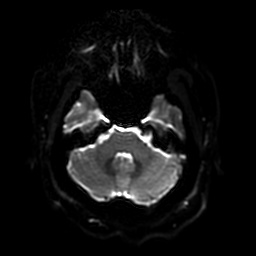
[im 39/88]
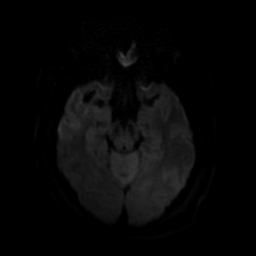
[im 49/88]
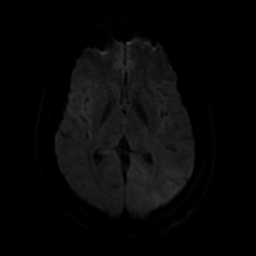
[im 59/88]
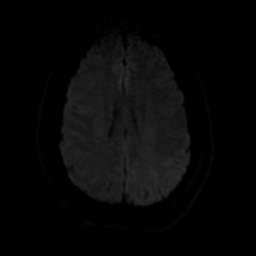
[im 78/88]
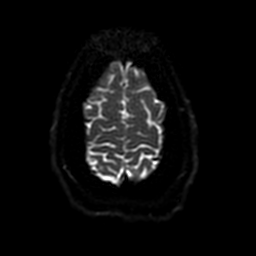
[im 88/88]
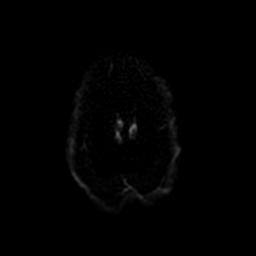

[Series 4: DWI · coronal · 5.0mm · 0.94mm/px · 7 of 72 slices shown (2 of 4)]
[im 1/72]
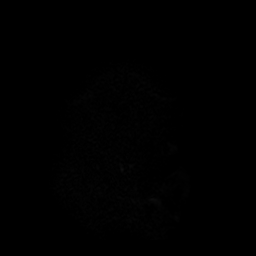
[im 12/72]
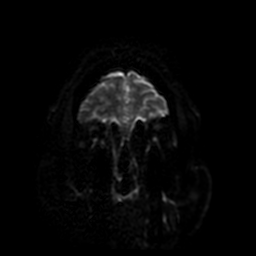
[im 24/72]
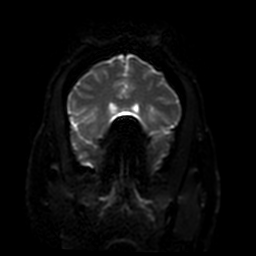
[im 36/72]
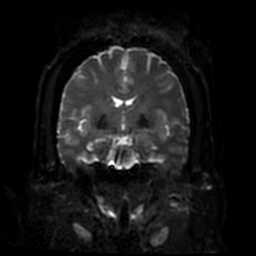
[im 48/72]
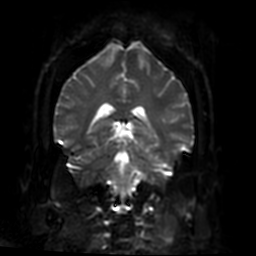
[im 60/72]
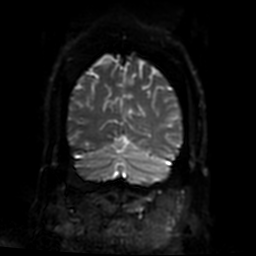
[im 72/72]
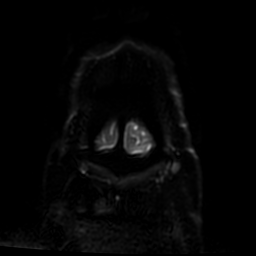

[Series 5: FLAIR · sagittal · 5.0mm · 0.47mm/px · 2 of 23 slices shown (1 of 2)]
[im 1/23]
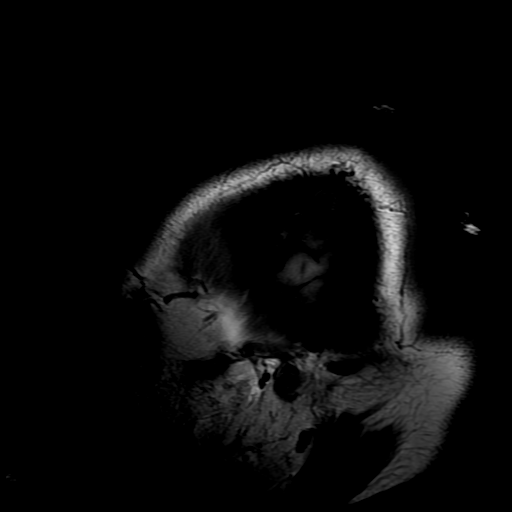
[im 23/23]
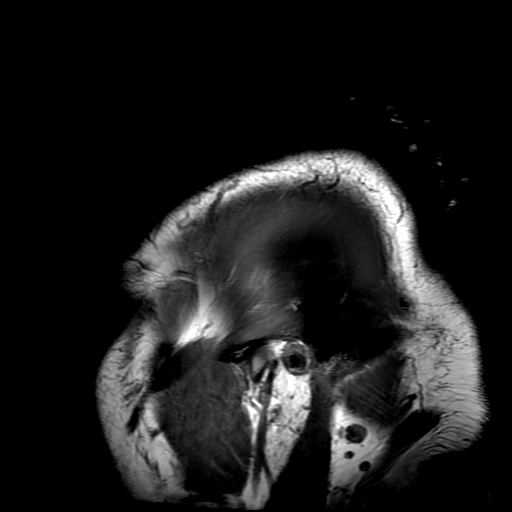

[Series 6: T2 · axial · 5.0mm · 0.47mm/px · z∈[-117,+25]mm · 2 of 25 slices shown (1 of 2)]
[im 1/25]
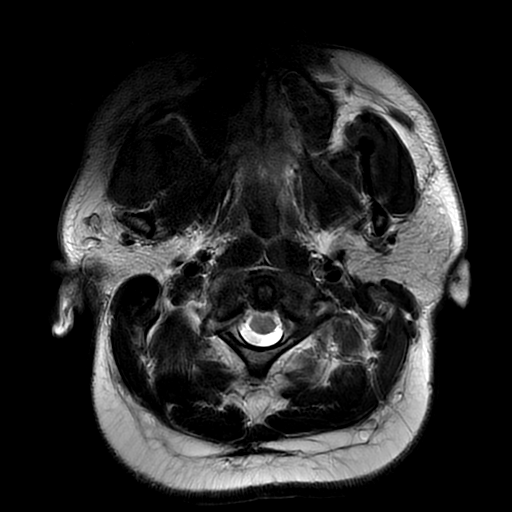
[im 25/25]
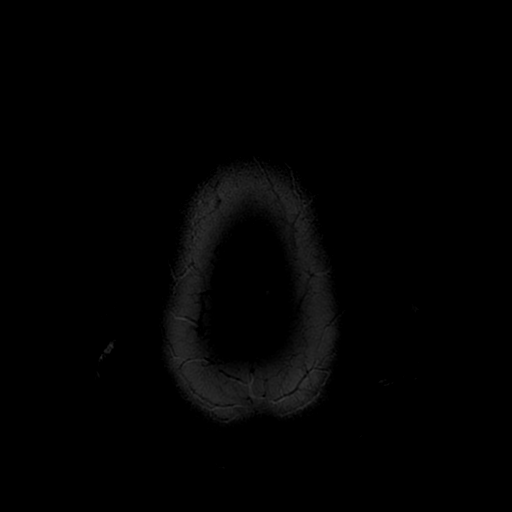

[Series 7: FLAIR · axial · 5.0mm · 0.47mm/px · z∈[-117,+25]mm · 2 of 25 slices shown (2 of 2)]
[im 1/25]
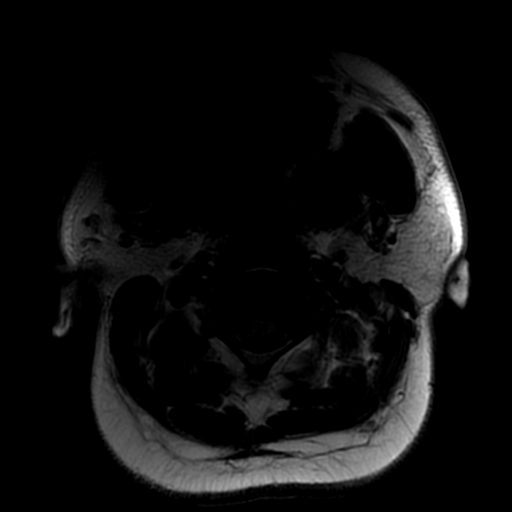
[im 25/25]
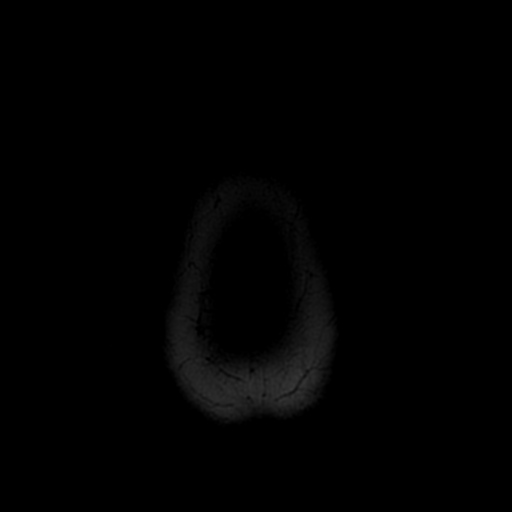

[Series 8: (person_name) · axial · 3.0mm · 0.47mm/px · z∈[-119,-70]mm · 3 of 100 slices shown]
[im 1/100]
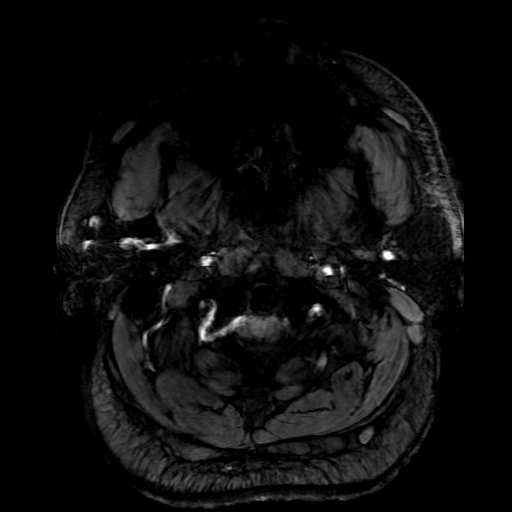
[im 12/100]
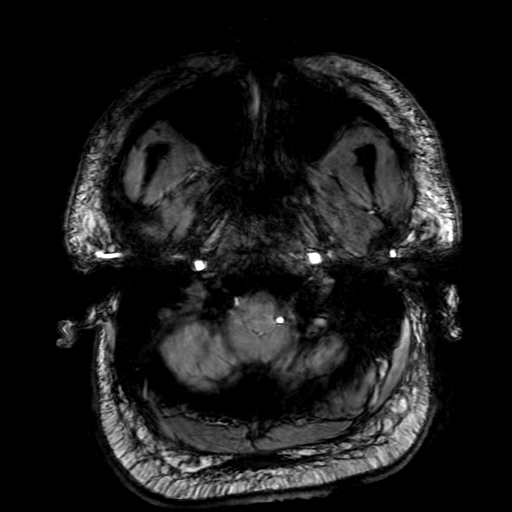
[im 34/100]
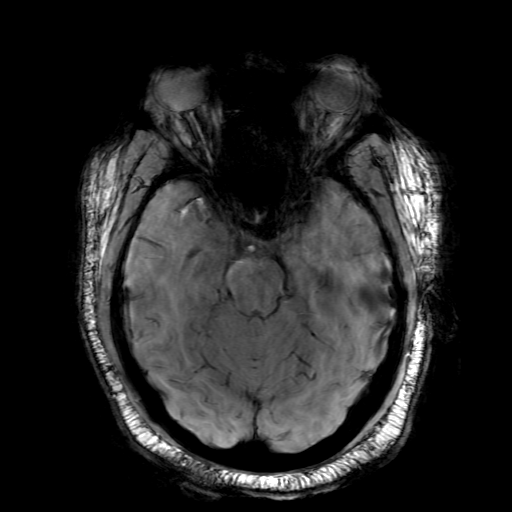

[Series 10: T2 · coronal · 5.0mm · 0.47mm/px · 3 of 30 slices shown (2 of 2)]
[im 1/30]
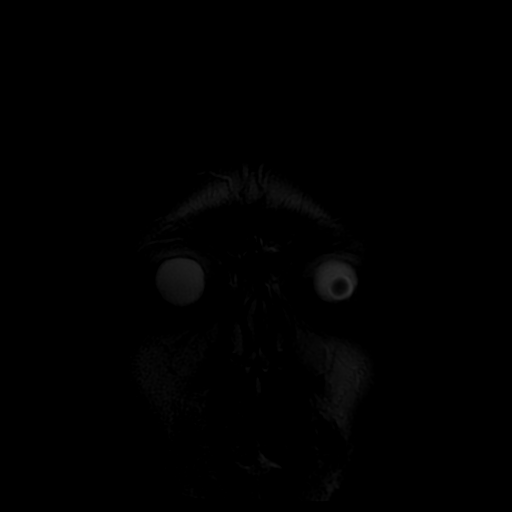
[im 15/30]
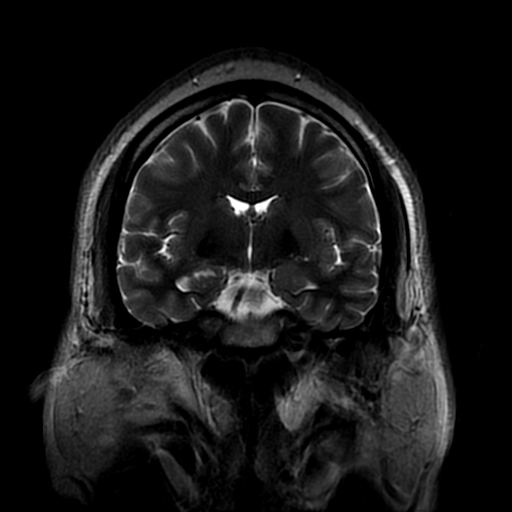
[im 30/30]
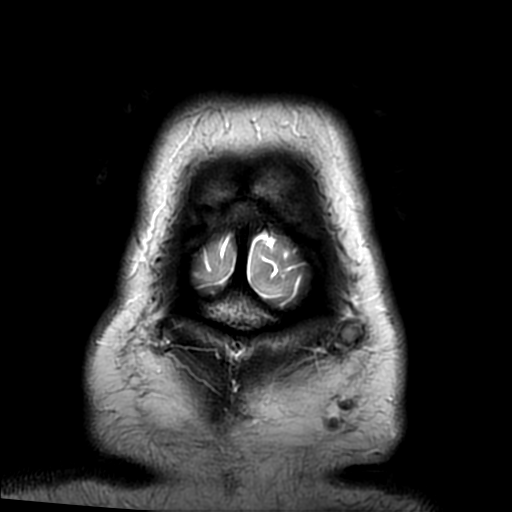

[Series 300: DWI · axial · 3.6mm · 0.94mm/px · z∈[-127,+26]mm · 4 of 43 slices shown (3 of 4)]
[im 1/43]
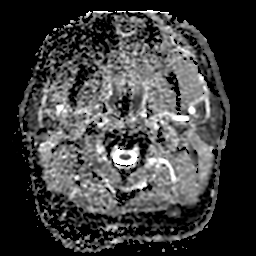
[im 15/43]
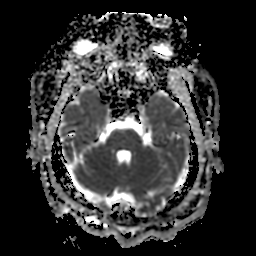
[im 29/43]
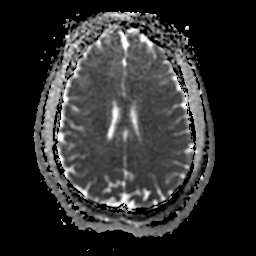
[im 43/43]
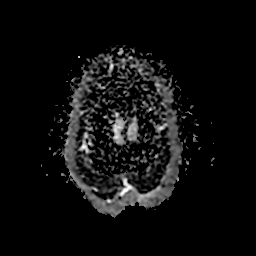

[Series 400: DWI · coronal · 5.0mm · 0.94mm/px · 3 of 35 slices shown (4 of 4)]
[im 1/35]
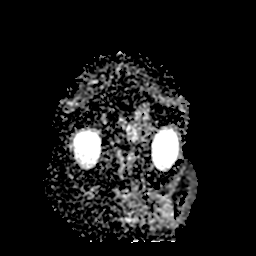
[im 18/35]
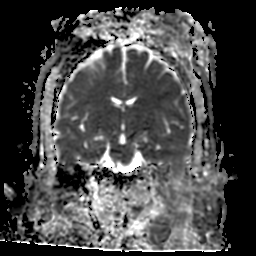
[im 35/35]
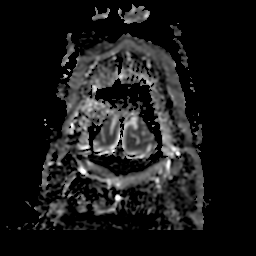

[34 of 48 positions shown; findings below may reference images not displayed]

FINDINGS: Large body habitus. Cerebral volume is normal. No restricted
diffusion to suggest acute infarction. No midline shift, mass
effect, evidence of mass lesion, ventriculomegaly, extra-axial
collection or acute intracranial hemorrhage. Cervicomedullary
junction and pituitary are within normal limits. Major intracranial
vascular flow voids are within normal limits.

Gray and white matter signal appears within normal limits.

Visible internal auditory structures appear normal. Mastoids are
clear. Mild paranasal sinus mucosal thickening, primarily in the
ethmoids. Orbit and scalp soft tissues are within normal limits.
Negative visualized cervical spine. Visualized bone marrow signal is
within normal limits.
IMPRESSION: Negative noncontrast MRI appearance of the brain.

## 2018-02-12 ENCOUNTER — Ambulatory Visit (INDEPENDENT_AMBULATORY_CARE_PROVIDER_SITE_OTHER): Payer: Medicare HMO | Admitting: Internal Medicine

## 2018-02-12 ENCOUNTER — Emergency Department (HOSPITAL_COMMUNITY): Payer: Medicare HMO

## 2018-02-12 ENCOUNTER — Encounter: Payer: Self-pay | Admitting: Internal Medicine

## 2018-02-12 ENCOUNTER — Encounter (INDEPENDENT_AMBULATORY_CARE_PROVIDER_SITE_OTHER): Payer: Self-pay

## 2018-02-12 ENCOUNTER — Observation Stay: Admission: AD | Admit: 2018-02-12 | Payer: Medicare HMO | Source: Ambulatory Visit | Admitting: Internal Medicine

## 2018-02-12 ENCOUNTER — Inpatient Hospital Stay (HOSPITAL_COMMUNITY)
Admission: EM | Admit: 2018-02-12 | Discharge: 2018-02-13 | DRG: 305 | Disposition: A | Discharge status: Home / Self Care | Payer: Medicare HMO | Source: Ambulatory Visit | Attending: Internal Medicine | Admitting: Internal Medicine

## 2018-02-12 ENCOUNTER — Observation Stay (HOSPITAL_COMMUNITY): Payer: Medicare HMO

## 2018-02-12 ENCOUNTER — Other Ambulatory Visit: Payer: Self-pay

## 2018-02-12 ENCOUNTER — Encounter (HOSPITAL_COMMUNITY): Payer: Self-pay | Admitting: *Deleted

## 2018-02-12 VITALS — BP 200/140 | HR 87 | Temp 98.6°F | Ht 73.0 in | Wt 306.4 lb

## 2018-02-12 DIAGNOSIS — Z8249 Family history of ischemic heart disease and other diseases of the circulatory system: Secondary | ICD-10-CM

## 2018-02-12 DIAGNOSIS — M5010 Cervical disc disorder with radiculopathy, unspecified cervical region: Secondary | ICD-10-CM

## 2018-02-12 DIAGNOSIS — E7800 Pure hypercholesterolemia, unspecified: Secondary | ICD-10-CM

## 2018-02-12 DIAGNOSIS — J45909 Unspecified asthma, uncomplicated: Secondary | ICD-10-CM

## 2018-02-12 DIAGNOSIS — H548 Legal blindness, as defined in USA: Secondary | ICD-10-CM | POA: Diagnosis present

## 2018-02-12 DIAGNOSIS — M5412 Radiculopathy, cervical region: Secondary | ICD-10-CM | POA: Diagnosis not present

## 2018-02-12 DIAGNOSIS — R221 Localized swelling, mass and lump, neck: Secondary | ICD-10-CM | POA: Diagnosis not present

## 2018-02-12 DIAGNOSIS — R0789 Other chest pain: Secondary | ICD-10-CM | POA: Diagnosis not present

## 2018-02-12 DIAGNOSIS — M109 Gout, unspecified: Secondary | ICD-10-CM | POA: Diagnosis present

## 2018-02-12 DIAGNOSIS — E785 Hyperlipidemia, unspecified: Secondary | ICD-10-CM | POA: Diagnosis present

## 2018-02-12 DIAGNOSIS — I10 Essential (primary) hypertension: Secondary | ICD-10-CM

## 2018-02-12 DIAGNOSIS — E041 Nontoxic single thyroid nodule: Secondary | ICD-10-CM | POA: Insufficient documentation

## 2018-02-12 DIAGNOSIS — R7303 Prediabetes: Secondary | ICD-10-CM | POA: Diagnosis present

## 2018-02-12 DIAGNOSIS — R079 Chest pain, unspecified: Secondary | ICD-10-CM | POA: Diagnosis not present

## 2018-02-12 DIAGNOSIS — Z833 Family history of diabetes mellitus: Secondary | ICD-10-CM

## 2018-02-12 DIAGNOSIS — Z885 Allergy status to narcotic agent status: Secondary | ICD-10-CM

## 2018-02-12 DIAGNOSIS — R69 Illness, unspecified: Secondary | ICD-10-CM | POA: Diagnosis not present

## 2018-02-12 DIAGNOSIS — Z87898 Personal history of other specified conditions: Secondary | ICD-10-CM

## 2018-02-12 DIAGNOSIS — I160 Hypertensive urgency: Principal | ICD-10-CM | POA: Diagnosis present

## 2018-02-12 DIAGNOSIS — H18603 Keratoconus, unspecified, bilateral: Secondary | ICD-10-CM | POA: Diagnosis present

## 2018-02-12 DIAGNOSIS — Z6841 Body Mass Index (BMI) 40.0 and over, adult: Secondary | ICD-10-CM

## 2018-02-12 DIAGNOSIS — F17210 Nicotine dependence, cigarettes, uncomplicated: Secondary | ICD-10-CM | POA: Diagnosis present

## 2018-02-12 DIAGNOSIS — F419 Anxiety disorder, unspecified: Secondary | ICD-10-CM

## 2018-02-12 DIAGNOSIS — R200 Anesthesia of skin: Secondary | ICD-10-CM

## 2018-02-12 DIAGNOSIS — I169 Hypertensive crisis, unspecified: Secondary | ICD-10-CM

## 2018-02-12 DIAGNOSIS — M25512 Pain in left shoulder: Secondary | ICD-10-CM | POA: Diagnosis not present

## 2018-02-12 DIAGNOSIS — R202 Paresthesia of skin: Secondary | ICD-10-CM

## 2018-02-12 DIAGNOSIS — Z7982 Long term (current) use of aspirin: Secondary | ICD-10-CM

## 2018-02-12 HISTORY — DX: Gout, unspecified: M10.9

## 2018-02-12 LAB — TROPONIN I
Troponin I: 0.07 ng/mL (ref <0.03)
Troponin I: 0.07 ng/mL (ref <0.03)

## 2018-02-12 LAB — CBC
HEMATOCRIT: 53.5 % — AB (ref 39.0–52.0)
Hemoglobin: 17 g/dL (ref 13.0–17.0)
MCH: 27.6 pg (ref 26.0–34.0)
MCHC: 31.8 g/dL (ref 30.0–36.0)
MCV: 86.9 fL (ref 78.0–100.0)
Platelets: 300 10*3/uL (ref 150–400)
RBC: 6.16 MIL/uL — ABNORMAL HIGH (ref 4.22–5.81)
RDW: 14.1 % (ref 11.5–15.5)
WBC: 10.7 10*3/uL — ABNORMAL HIGH (ref 4.0–10.5)

## 2018-02-12 LAB — URINALYSIS, COMPLETE (UACMP) WITH MICROSCOPIC
BILIRUBIN URINE: NEGATIVE
GLUCOSE, UA: NEGATIVE mg/dL
Ketones, ur: 5 mg/dL — AB
LEUKOCYTES UA: NEGATIVE
NITRITE: NEGATIVE
PH: 6 (ref 5.0–8.0)
Protein, ur: NEGATIVE mg/dL
SPECIFIC GRAVITY, URINE: 1.013 (ref 1.005–1.030)

## 2018-02-12 LAB — RAPID URINE DRUG SCREEN, HOSP PERFORMED
Amphetamines: NOT DETECTED
Barbiturates: NOT DETECTED
Benzodiazepines: NOT DETECTED
Cocaine: NOT DETECTED
OPIATES: NOT DETECTED
TETRAHYDROCANNABINOL: POSITIVE — AB

## 2018-02-12 LAB — LIPID PANEL
CHOL/HDL RATIO: 6.3 ratio
CHOLESTEROL: 200 mg/dL (ref 0–200)
HDL: 32 mg/dL — AB (ref >40)
LDL Cholesterol: 130 mg/dL — ABNORMAL HIGH (ref 0–99)
TRIGLYCERIDES: 190 mg/dL — AB (ref <150)
VLDL: 38 mg/dL (ref 0–40)

## 2018-02-12 LAB — BASIC METABOLIC PANEL
Anion gap: 10 (ref 5–15)
BUN: 13 mg/dL (ref 6–20)
CO2: 21 mmol/L — AB (ref 22–32)
Calcium: 9.3 mg/dL (ref 8.9–10.3)
Chloride: 109 mmol/L (ref 98–111)
Creatinine, Ser: 1.25 mg/dL — ABNORMAL HIGH (ref 0.61–1.24)
GFR calc Af Amer: >60 mL/min (ref >60)
GFR calc non Af Amer: >60 mL/min (ref >60)
GLUCOSE: 90 mg/dL (ref 70–99)
POTASSIUM: 4.4 mmol/L (ref 3.5–5.1)
Sodium: 140 mmol/L (ref 135–145)

## 2018-02-12 LAB — HEMOGLOBIN A1C
Hgb A1c MFr Bld: 5.5 % (ref 4.8–5.6)
Mean Plasma Glucose: 111.15 mg/dL

## 2018-02-12 LAB — TSH: TSH: 0.598 u[IU]/mL (ref 0.350–4.500)

## 2018-02-12 LAB — RETICULOCYTES
RBC.: 5.73 MIL/uL (ref 4.22–5.81)
RETIC COUNT ABSOLUTE: 80.2 10*3/uL (ref 19.0–186.0)
Retic Ct Pct: 1.4 % (ref 0.4–3.1)

## 2018-02-12 LAB — I-STAT TROPONIN, ED: Troponin i, poc: 0.12 ng/mL (ref 0.00–0.08)

## 2018-02-12 MED ORDER — HYDROXYZINE HCL 25 MG PO TABS
25.0000 mg | ORAL_TABLET | Freq: Three times a day (TID) | ORAL | Status: DC | PRN
  Administered 2018-02-12 – 2018-02-13 (×2): 25 mg via ORAL
  Filled 2018-02-12 (×2): qty 1

## 2018-02-12 MED ORDER — ATORVASTATIN CALCIUM 40 MG PO TABS
40.0000 mg | ORAL_TABLET | Freq: Every day | ORAL | Status: DC
  Administered 2018-02-12: 40 mg via ORAL
  Filled 2018-02-12: qty 1

## 2018-02-12 MED ORDER — SENNOSIDES-DOCUSATE SODIUM 8.6-50 MG PO TABS: 1.0000 | ORAL_TABLET | Freq: Every evening | ORAL | Status: DC | PRN

## 2018-02-12 MED ORDER — HYDRALAZINE HCL 25 MG PO TABS
25.0000 mg | ORAL_TABLET | Freq: Three times a day (TID) | ORAL | Status: DC
  Administered 2018-02-12: 25 mg via ORAL
  Filled 2018-02-12: qty 1

## 2018-02-12 MED ORDER — ACETAMINOPHEN 325 MG PO TABS
650.0000 mg | ORAL_TABLET | Freq: Four times a day (QID) | ORAL | Status: DC | PRN
  Administered 2018-02-13: 650 mg via ORAL
  Filled 2018-02-12: qty 2

## 2018-02-12 MED ORDER — AMLODIPINE BESYLATE 10 MG PO TABS
10.0000 mg | ORAL_TABLET | Freq: Every day | ORAL | Status: DC
  Administered 2018-02-12: 10 mg via ORAL
  Filled 2018-02-12: qty 2

## 2018-02-12 MED ORDER — LIDOCAINE 5 % EX PTCH
1.0000 | MEDICATED_PATCH | CUTANEOUS | Status: DC
  Administered 2018-02-12: 1 via TRANSDERMAL
  Filled 2018-02-12: qty 1

## 2018-02-12 MED ORDER — NITROGLYCERIN 0.4 MG SL SUBL
0.4000 mg | SUBLINGUAL_TABLET | SUBLINGUAL | Status: DC | PRN
  Administered 2018-02-13 (×3): 0.4 mg via SUBLINGUAL
  Filled 2018-02-12: qty 1

## 2018-02-12 MED ORDER — HYDRALAZINE HCL 50 MG PO TABS
50.0000 mg | ORAL_TABLET | Freq: Three times a day (TID) | ORAL | Status: DC
  Administered 2018-02-12: 50 mg via ORAL
  Filled 2018-02-12: qty 1

## 2018-02-12 MED ORDER — ACETAMINOPHEN 650 MG RE SUPP: 650.0000 mg | Freq: Four times a day (QID) | RECTAL | Status: DC | PRN

## 2018-02-12 MED ORDER — ENOXAPARIN SODIUM 40 MG/0.4ML ~~LOC~~ SOLN
40.0000 mg | SUBCUTANEOUS | Status: DC
  Administered 2018-02-12: 40 mg via SUBCUTANEOUS
  Filled 2018-02-12 (×2): qty 0.4

## 2018-02-12 MED ORDER — NICOTINE 14 MG/24HR TD PT24
14.0000 mg | MEDICATED_PATCH | Freq: Every day | TRANSDERMAL | Status: DC | PRN
  Administered 2018-02-13: 14 mg via TRANSDERMAL
  Filled 2018-02-12: qty 1

## 2018-02-12 NOTE — H&P (Addendum)
Date: 02/12/2018               Patient Name:  Samuel Decker MRN: [id number]  DOB: Oct 21, 1980 Age / Sex: 37 y.o., male   PCP: Lorenso Courierhundi, Vahini, MD              Medical Service: Internal Medicine Teaching Service              Attending Physician: Dr. Doneen PoissonLawrence Klima    First Contact: Adah SalvageMadeleine Hamilton, MS3 Pager: [phone]  Second Contact: Dr. Karilyn Cotaehman  Pager: [phone]  Third Contact Dr. Vincente LibertyMolt Pager: [phone]       After Hours (After 5p/  First Contact Pager: [phone]  weekends / holidays): Second Contact Pager: [phone]   Chief Complaint:  I was sent here from clinic because of my blood pressure   History of Present Illness: Samuel LocketWalter D Osterberg is a 37 yo man with PMHx of HTN, asthma, gout and legal blindness who presented to the ED after being seen in clinic earlier today with a BP of 216/135 that was concerning for hypertensive crisis. He developed chest pain while in clinic after being told he may need to be admitted, but was previously feeling well today and walked two miles this morning. Over the past 2 to 3 weeks, he describes increasingly frequent chest pain that can come on at any time unrelated to his activity level. The chest pain sometimes starts under his left breast and sometimes starts substernally. The pain is very intense and is relieved by resting, lying down, leaning forward and trying to relax to reduce anxiety. The chest pain does not radiate to his jaw or arm from what he has observed. Chest pain reaches 10/10 level then returns to about 4-5 out of 10. He does have numbness radiating down his left arm intermittently for the past few months. This arm numbness sometimes occurs with the chest pain, but other times is not associated. He does not have known injury to this shoulder, but states he just woke up with it a few months ago and that the numbness sometimes radiates down his left flank and hip. He has reduced his activity level the past couple of weeks due to trying to reduce  his stress level. He notes increasing anxiety since his father died 3 years ago and feels like he would benefit from seeking therapy for this. He endorses intermittent headaches for the past several weeks, but these are unrelated to the chest pain and were relieved by the ibuprofen he was taking for his recent gout flare. He is legally blind and has not noticed changes to his baseline level of vision, but recalls that when he was younger his vision would become blurry when his blood pressure was elevated. He is not sure of how his vision progressed to this point, but wonders if it related to his longstanding hypertension. He was previously managed with amlodipine and "another medicine", but stopped taking these medicines about 2-3 years ago after his father passed away and he did not want to confront his own health. He denies increased sweating, nausea, vomiting, back pain and palpitations. He experiences shortness of breath only after longer walks when he walks up a lot of stairs.    He was evaluated again after he was admitted and started experiencing chest pain, became tearful and anxious during a neuro exam. Patient calmed down with no intervention but pain was brought on again by palpation. He described the pain residing in his "left chest bulge."  Meds: No current facility-administered medications on file prior to encounter.    Current Outpatient Medications on File Prior to Encounter  Medication Sig Dispense Refill  . ibuprofen (ADVIL,MOTRIN) 200 MG tablet Take 800 mg by mouth every 6 (six) hours as needed.    Marland Kitchen amLODipine (NORVASC) 5 MG tablet Take 1 tablet (5 mg total) by mouth daily. (Patient not taking: Reported on 02/12/2018) 30 tablet 2  . aspirin 81 MG chewable tablet Chew 1 tablet (81 mg total) by mouth daily. (Patient not taking: Reported on 02/12/2018) 30 tablet 0  . metoprolol (LOPRESSOR) 50 MG tablet Take 1 tablet (50 mg total) by mouth 2 (two) times daily. (Patient not taking: Reported  on 02/12/2018) 60 tablet 2    Allergies: Allergies as of 02/12/2018 - Review Complete 02/12/2018  Allergen Reaction Noted  . Buprenorphine hcl Itching and Swelling 01/20/2014  . Morphine and related Itching and Swelling 01/20/2014   Past Medical History:  Diagnosis Date  . Asthma    Childhood, not active  . Gout    Most recent flare 01/2018  . Hypertension   . Keratoconus of both eyes   . Legally blind    Past Surgical History:  Procedure Laterality Date  . "groin surgery"    . EYE SURGERY     Penetrating Keratoplasty   Family History  Problem Relation Age of Onset  . Hypertension Mother   . Hypertension Father   . Diabetes Father   . Heart disease Father   . Hypertension Paternal Grandmother   . Diabetes Paternal Grandmother    Social History   Socioeconomic History  . Marital status: Married    Spouse name: Not on file  . Number of children: Not on file  . Years of education: College  . Highest education level: Not on file  Occupational History  . Occupation: disability  Social Needs  . Financial resource strain: Not on file  . Food insecurity:    Worry: Not on file    Inability: Not on file  . Transportation needs:    Medical: Not on file    Non-medical: Not on file  Tobacco Use  . Smoking status: Current Every Day Smoker    Packs/day: 1.00    Types: Cigarettes  . Smokeless tobacco: Never Used  Substance and Sexual Activity  . Alcohol use: Yes    Alcohol/week: 6.0 standard drinks    Types: 6 Shots of liquor per week    Comment: "We drink a handle on the weekends"   . Drug use: No  . Sexual activity: Not on file  Lifestyle  . Physical activity:    Days per week: Not on file    Minutes per session: Not on file  . Stress: Not on file  Relationships  . Social connections:    Talks on phone: Not on file    Gets together: Not on file    Attends religious service: Not on file    Active member of club or organization: Not on file    Attends meetings  of clubs or organizations: Not on file    Relationship status: Not on file  . Intimate partner violence:    Fear of current or ex partner: Not on file    Emotionally abused: Not on file    Physically abused: Not on file    Forced sexual activity: Not on file  Other Topics Concern  . Not on file  Social History Narrative  . Reports increased stress following  death of his father 3 years ago. He worries about not being around for his 75 year old son.     Review of Systems: Pertinent items noted in HPI and remainder of comprehensive ROS otherwise negative.  Physical Exam: Blood pressure (!) 213/133, pulse 79, temperature 98.8 F (37.1 C), temperature source Oral, resp. rate 18, height 6\' 1"  (1.854 m), weight (!) 138.8 kg, SpO2 96 %. BP (!) 213/133 (BP Location: Left Arm)   Pulse 79   Temp 98.8 F (37.1 C) (Oral)   Resp 18   Ht 6\' 1"  (1.854 m)   Wt (!) 138.8 kg   SpO2 96%   BMI 40.37 kg/m  General appearance: Alert, friendly, well-developed, well-nourished, anxious-appearing but in no acute distress  Head: Normocephalic, without obvious abnormality, atraumatic Eyes: Bilateral conjunctival injection, corneas cloudy bilaterally, PEERLA Neck: no carotid bruit, no JVD and supple, symmetrical, trachea midline Lungs: clear to auscultation bilaterally, comfortable work of breathing on room air, no clubbing or cyanosis Heart: regular rate and rhythm, S1, S2 normal, no murmur, click, rub or gallop Abdomen: soft, non-tender; bowel sounds normal; no masses,  no organomegaly  Neuro: Chronically diminished visual acuity, but EOMI intact. CN III- XII intact. Strength 5/5 bilaterally. Intact sensation in bilateral extremities.  Extremities: extremities normal, atraumatic, no cyanosis or edema. R big toe swollen compared to L.  Pulses: 2+ and symmetric  Lab results: CBC CBC Latest Ref Rng & Units 02/12/2018 03/01/2015 03/01/2015  WBC 4.0 - 10.5 K/uL 10.7(H) - 9.6  Hemoglobin 13.0 - 17.0 g/dL 16.1  17.3(H) 15.3  Hematocrit 39.0 - 52.0 % 53.5(H) 51.0 47.4  Platelets 150 - 400 K/uL 300 - 287    CMP Latest Ref Rng & Units 02/12/2018 03/01/2015 03/01/2015  Glucose 70 - 99 mg/dL 90 096(E) 454(U)  BUN 6 - 20 mg/dL 13 8 6   Creatinine 0.61 - 1.24 mg/dL 9.81(X) 9.14 7.82  Sodium 135 - 145 mmol/L 140 141 137  Potassium 3.5 - 5.1 mmol/L 4.4 3.8 3.8  Chloride 98 - 111 mmol/L 109 105 106  CO2 22 - 32 mmol/L 21(L) - 24  Calcium 8.9 - 10.3 mg/dL 9.3 - 9.0  Total Protein 6.5 - 8.1 g/dL - - 7.5  Total Bilirubin 0.3 - 1.2 mg/dL - - 0.3  Alkaline Phos 38 - 126 U/L - - 89  AST 15 - 41 U/L - - 29  ALT 17 - 63 U/L - - 32    Imaging results:  Dg Chest 2 View  Result Date: 02/12/2018 CLINICAL DATA:  Chest pain EXAM: CHEST - 2 VIEW COMPARISON:  June 15, 2014 FINDINGS: Lungs are clear. Heart is mildly enlarged with pulmonary vascularity normal. No adenopathy. No pneumothorax. No evident bone lesions. IMPRESSION: No edema or consolidation.  Heart mildly enlarged. Electronically Signed   By: Bretta Bang III M.D.   On: 02/12/2018 15:48   CXR 8/14, personally reviewed:  PA: Cardiac silhouette moderately enlarged. Bilateral diaphragms well visualized without evidence for effusion. Normal lung markings without collapse or infiltrate. Soft tissues and bones unremarkable.  Lateral: Bilateral diaphragms well visualized.  No infiltrate visualized.   Other results: EKG: Normal sinus rhythm, evidence of LAE in V1, T wave inversions noted in leads I,II, aVL, aVF, V4, V5 and V6. Compared to EKG in 2016, diffuse T wave inversions are unchanged.   Assessment & Plan by Problem: AMIIR HECKARD is a 37 yo man with PMHx of HTN, asthma, gout and blindness presenting to the ED from clinic  for management of hypertensive urgency and atypical chest pain.   Hypertensive Urgency  Presented with blood pressures of 220s/150s. Not currently on medication, but previously on amlodipine 5mg  and metoprolol tartrate 50 mg.  No current evidence to suggest end organ damage with reassuring BMP and physical exam, but measuring serial troponins to assess for ischemic changes.  Long history of hypertension concerning for possible secondary cause including renovascular disease.  - Renin and aldosterone pending  - Renal U/S pending for evaluation of renovascular disease  - Start Hydralazine 25 mg  - Start Amlodipine 10 mg  Atypical Chest Pain  Episodic chest pain over the past several weeks as well as history of similar symptoms dating back to 2011.  Given this timeline, presentation appears to be non-cardiac in nature and associated with acute anxiety. However, given severe hypertension and multiple risk factors for ACS will pursue workup to rule out cardiac etiology. Initial troponin of 0.07. EKG 8/14 does not show acute abnormality when compared to past EKGs. Workup in 2011 with cardiac CTA with calcium score did not indicating any coronary disease at that time. Further imaging in 2015 including CTA chest showed no evidence of thoracic aortic aneurysm or dissection with incidental finding of possible left thyroid lobe nodule.   - Serial troponins pending  - ECHO pending  - Continue Telemetry  - TSH pending  - Repeat EKG prn chest pain  - Nitroglycerin prn   L shoulder Pain  Symptoms distinct from chest pain. Numbness when moving arm up above head. Chronic per chart review with similar presentation in 2016. Xray of shoulder in 2016 was unremarkable and venous duplex ultrasound did not show evidence of deep vein or superficial thrombosis in the left upper extremity.  - Acetaminophen prn mild pain    Tobacco and Alcohol Use  Currently smokes 1 ppd (3 pack year history). Describes significant alcohol use including ~ 1/2 of a handle or about 30 oz of liquor on the weekend. Interested in smoking cessation. Reported allergy to buprenorphine, but unclear why this is known given no history of drug use per patient and chart  review.  - CIWA protocol  - nicotine patch prn  - UDS pending  Hyperlipidemia  History of hyperlipidemia dating back to 2011 per chart review. No history of medical management.  - Lipid panel pending    Principal Problem:   Hypertensive urgency Active Problems:   History of prediabetes  DVT prophylaxis: Lovenox  Diet: Heart Healthy  Code Status: Full   This is a Psychologist, occupationalMedical Student Note.  The care of the patient was discussed with Dr. Karilyn Cotaehman and the assessment and plan was formulated with their assistance.  Please see their note for official documentation of the patient encounter.   Signed: [place]ion, Franne FortsMadeleine M, Medical Student 02/12/2018, 7:23 PM    Attestation for Student Documentation:  I personally was present and performed or re-performed the history, physical exam and medical decision-making activities of this service and have verified that the service and findings are accurately documented in the student's note.  Afsana Liera N, DO 02/12/2018, 8:03 PM

## 2018-02-12 NOTE — Progress Notes (Signed)
Paged the on cal Dr. Vicente MassonKilma about this patient elevated BP. Sys. 221/143 and left below the rib cage swelling and tender with touch. No new orders received. To give Bp medications as ordered. Patietn is not in distress. Called the rapid response nurse and showed him the swelling and the BP.

## 2018-02-12 NOTE — ED Notes (Signed)
Informed first nurse Thayer OhmChris of troponin of 0.12

## 2018-02-12 NOTE — Assessment & Plan Note (Signed)
The patient's blood pressure during this visit was 216/135 and repeat was 200/140. The patient has not had a pcp and has been in and out of emergency rooms for several visits over the past year. He was previously on amlodipine, but ran out of his pills and did not go to a physician to get them refilled.   The patient has a strong family history of hypertension and cardiac disease (in his father).  The patient states that he has been having He has lost approximately 30 lbs in the pat 2 years.  Once the patient was informed about his elevated blood pressure he mentioned that he started having anterior chest pain without any accompanying shortness of breath.  Assessment and plan The patient has chronically elevated hypertension as noted on chart review as early as 2011.  The patient states that he first noted that he had elevated blood pressure when he was 37 years of age. Reasons for the patient's elevated blood pressure could be genetic, renal artery gnosis, hyperaldosteronism, essential hypertension.  Patient currently has hypertensive crisis and labs will need to be ordered to determine if he has endorgan damage.  Could not appreciate any subjective findings of end organ damage. The temporal association between him being notified of his admission and his acute onset of chest makes me believe that it is noncardiac in nature and more likely from a panic attack. However, the patient was emergently taken to the ED for assessment.

## 2018-02-12 NOTE — Assessment & Plan Note (Signed)
The patient mentioned that he has been having right first toe pain for the past 2 weeks. He states that he recently consumed large amounts of alcohol, pork, hamburger meat, and red meat.   He states that he read online that cherry juice will help decrease uric acid levels and therefore has been drinking a lot of it for the past few weeks. He has never been on medication for his gout.   Assessment and plan The patient continues to have inflammation and erythema noted on his right first toe and therefore cannot check his uric acid at this time. Planned to start him on allopurinol and colchicine during this visit, but due to the patient's admission did not order it. Will follow up with the patient after discharge.

## 2018-02-12 NOTE — ED Triage Notes (Signed)
Internal medicine doctors want this pt admitted to them

## 2018-02-12 NOTE — Progress Notes (Addendum)
IMTS INTERVAL PROGRESS NOTE [**SEE ADDENDUM BELOW**]  Received page from RN regarding elevated BP and left pain and swelling of anterior rib cage. Evaluated patient at bedside and examined the area. Reports an area of pain and swelling below left rib cage for years but has been more bothersome over the past few weeks. He explained that while MS3 was examining him earlier, lifting his arm above his head and grazing the area caused terrible pain and apologized for reacting so dramatically. On exam, there is fullness under his left breast at site of pain and was exceptionally tender to palpation. No significant cutaneous changes. No rash, no abdominal surgeries and is not associated with meals. Discussed that I wasn't able to appreciate much on prior imaging but I have ordered an US of the area for further evaluation. He was very appreciative and is hopeful this will provide some helpful information.   We also were able to have ~30 minute conversation about his anxiety, family and his interests/stress releasing activities. He's been dealing with labile moods and anxiety since his father passed away 3 years ago of what patient notes was cardiac disease. He reports his dad had similar issues with his blood pressure and expressed determination to take care of his health and stay around as long as possible for his 561-year old son. 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 Expressed desire to begin talk therapy for anxiety but suspect he would be agreeable to SSRI if needed.   -He responded very well to our discussion and reported significant reduction in anxiety and pain -Hydroxyzine prn to help with insomnia and anxiety -BP's still in 200's/130's after receiving Hydralazine 25 mg at 530pm. His BP elevation is chronic and do not want to lower suddenly however would like better control; will increase to 50 mg TID.  -Also  started on Amlodipine earlier today but don't expect to see much improvement in BP with this for a few days.  -Patient actually heading down for US of his chest wall now, will follow in AM -Lidocaine patch ordered, please apply after US -Reviewed telemetry; no ST segment changes or runs of VT -Troponins trending, repeat EKG in AM  Samuel Chalfin, DO Internal Medicine PGY3 Pager: [facility] - [facility] Res[facility]   ADDENDUM: No BP response with increased hydralazine dose 5-hrs ago. Chart review show's he's had reliably good blood pressure control in the past with Amlodipine 5mg  and Metoprolol 50mg  twice daily. While not usually first-line, I'm stopping Hydral, restarting Metoprolol and continuing Amlodipine given his good control previously. Hopeful to have some improvement in blood pressure soon.

## 2018-02-12 NOTE — Progress Notes (Addendum)
CC: Establish care and follow up regarding gout  HPI:  [name] is a 37 y.o. with hypertension, asthma, gout, who is legally blind who presented to establish care and discuss about his gout. Please see problem based charting for evaluation, assessment, and plan.   Past Medical History:  Diagnosis Date  . Asthma   . Hypertension   . Keratoconus of both eyes   . Legally blind    Review of Systems:   Denies shortness of breath, dizziness Has polyuria, fatigue, polydipsia, chest pain over anterior chest   Physical Exam:  Vitals:   02/12/18 1348  BP: (!) 216/135  Pulse: 87  Temp: 98.6 F (37 C)  TempSrc: Oral  SpO2: 97%  Weight: (!) 306 lb 6.4 oz (139 kg)  Height: 6\' 1"  (1.854 m)   Physical Exam  Constitutional: He is oriented to person, place, and time. He appears well-developed and well-nourished. No distress.  HENT:  Head: Normocephalic and atraumatic.  Eyes: Conjunctivae are normal.  Cardiovascular: Normal rate, regular rhythm and normal heart sounds.  No murmur heard. Respiratory: Effort normal and breath sounds normal. No respiratory distress. He has no wheezes.  GI: Soft. Bowel sounds are normal. He exhibits no distension. There is no tenderness.  Musculoskeletal: He exhibits no edema.  Right first toe erythematous, swollen, and warm to touch  Neurological: He is alert and oriented to person, place, and time.  Skin: He is not diaphoretic.  Psychiatric: He has a normal mood and affect. His behavior is normal. Judgment and thought content normal.   Assessment & Plan:   See Encounters Tab for problem based charting.   Hypertensive Crisis The patient's blood pressure during this visit was 216/135 and repeat was 200/140. The patient has not had a pcp and has been in and out of emergency rooms for several visits over the past year. He was previously on amlodipine, but ran out of his pills and did not go to a physician to get them refilled.   The patient  has a strong family history of hypertension and cardiac disease (in his father).  The patient states that he has been having He has lost approximately 30 lbs in the pat 2 years.  Once the patient was informed about his elevated blood pressure he mentioned that he started having anterior chest pain without any accompanying shortness of breath.  Assessment and plan The patient has chronically elevated hypertension as noted on chart review as early as 2011.  The patient states that he first noted that he had elevated blood pressure when he was 37 years of age. Reasons for the patient's elevated blood pressure could be genetic, renal artery gnosis, hyperaldosteronism, essential hypertension.  Patient currently has hypertensive crisis and labs will need to be ordered to determine if he has endorgan damage.  Could not appreciate any subjective findings of end organ damage. The temporal association between him being notified of his admission and his acute onset of chest makes me believe that it is noncardiac in nature and more likely from a panic attack. However, the patient was emergently taken to the ED for assessment.   Gout The patient mentioned that he has been having right first toe pain for the past 2 weeks. He states that he recently consumed large amounts of alcohol, pork, hamburger meat, and red meat.   He states that he read online that cherry juice will help decrease uric acid levels and therefore has been drinking a lot of it  for the past few weeks. He has never been on medication for his gout.   Assessment and plan The patient continues to have inflammation and erythema noted on his right first toe and therefore cannot check his uric acid at this time. Planned to start him on allopurinol and colchicine during this visit, but due to the patient's admission did not order it. Will follow up with the patient after discharge.   Patient discussed with Dr. Rogelia BogaButcher

## 2018-02-12 NOTE — Plan of Care (Signed)
  Problem: Health Behavior/Discharge Planning: Goal: Ability to manage health-related needs will improve Outcome: Progressing   Problem: Clinical Measurements: Goal: Respiratory complications will improve Outcome: Progressing   

## 2018-02-12 NOTE — ED Triage Notes (Addendum)
Pt in from PCP today where he was being seen for gout in R big toe, pt sent here for eval of HTN & L sided CP non radiating x 2-3 wks, denies SOB, denies n/v/d, A&O x4, pt has swelling to L chest that is obvious on assessement, pt states, "when it swells up it really hurts. It has been like that for years and they say it is nothing, but over the last year it has really started to give me issues."

## 2018-02-12 NOTE — Plan of Care (Signed)
BP still elevated

## 2018-02-12 NOTE — ED Triage Notes (Addendum)
Elevated tro acuity  [facility] will send back  edp notified dr z

## 2018-02-13 ENCOUNTER — Observation Stay (HOSPITAL_BASED_OUTPATIENT_CLINIC_OR_DEPARTMENT_OTHER): Payer: Medicare HMO

## 2018-02-13 ENCOUNTER — Observation Stay (HOSPITAL_COMMUNITY): Payer: Medicare HMO

## 2018-02-13 DIAGNOSIS — I10 Essential (primary) hypertension: Secondary | ICD-10-CM | POA: Diagnosis not present

## 2018-02-13 DIAGNOSIS — M25512 Pain in left shoulder: Secondary | ICD-10-CM

## 2018-02-13 DIAGNOSIS — I160 Hypertensive urgency: Secondary | ICD-10-CM | POA: Diagnosis not present

## 2018-02-13 DIAGNOSIS — F17210 Nicotine dependence, cigarettes, uncomplicated: Secondary | ICD-10-CM | POA: Diagnosis present

## 2018-02-13 DIAGNOSIS — Z7289 Other problems related to lifestyle: Secondary | ICD-10-CM

## 2018-02-13 DIAGNOSIS — E785 Hyperlipidemia, unspecified: Secondary | ICD-10-CM | POA: Diagnosis not present

## 2018-02-13 DIAGNOSIS — M5412 Radiculopathy, cervical region: Secondary | ICD-10-CM | POA: Diagnosis not present

## 2018-02-13 DIAGNOSIS — E7800 Pure hypercholesterolemia, unspecified: Secondary | ICD-10-CM

## 2018-02-13 DIAGNOSIS — Z6839 Body mass index (BMI) 39.0-39.9, adult: Secondary | ICD-10-CM

## 2018-02-13 DIAGNOSIS — Z885 Allergy status to narcotic agent status: Secondary | ICD-10-CM | POA: Diagnosis not present

## 2018-02-13 DIAGNOSIS — R079 Chest pain, unspecified: Secondary | ICD-10-CM

## 2018-02-13 DIAGNOSIS — R0789 Other chest pain: Secondary | ICD-10-CM | POA: Diagnosis not present

## 2018-02-13 DIAGNOSIS — H548 Legal blindness, as defined in USA: Secondary | ICD-10-CM

## 2018-02-13 DIAGNOSIS — M50323 Other cervical disc degeneration at C6-C7 level: Secondary | ICD-10-CM | POA: Diagnosis not present

## 2018-02-13 DIAGNOSIS — Z79899 Other long term (current) drug therapy: Secondary | ICD-10-CM

## 2018-02-13 DIAGNOSIS — R7303 Prediabetes: Secondary | ICD-10-CM | POA: Diagnosis present

## 2018-02-13 DIAGNOSIS — Z8249 Family history of ischemic heart disease and other diseases of the circulatory system: Secondary | ICD-10-CM

## 2018-02-13 DIAGNOSIS — H18603 Keratoconus, unspecified, bilateral: Secondary | ICD-10-CM | POA: Diagnosis not present

## 2018-02-13 DIAGNOSIS — M109 Gout, unspecified: Secondary | ICD-10-CM | POA: Diagnosis not present

## 2018-02-13 DIAGNOSIS — M5010 Cervical disc disorder with radiculopathy, unspecified cervical region: Secondary | ICD-10-CM

## 2018-02-13 DIAGNOSIS — R69 Illness, unspecified: Secondary | ICD-10-CM | POA: Diagnosis not present

## 2018-02-13 DIAGNOSIS — F419 Anxiety disorder, unspecified: Secondary | ICD-10-CM

## 2018-02-13 DIAGNOSIS — Z6841 Body Mass Index (BMI) 40.0 and over, adult: Secondary | ICD-10-CM | POA: Diagnosis not present

## 2018-02-13 DIAGNOSIS — Z833 Family history of diabetes mellitus: Secondary | ICD-10-CM | POA: Diagnosis not present

## 2018-02-13 DIAGNOSIS — Z888 Allergy status to other drugs, medicaments and biological substances status: Secondary | ICD-10-CM

## 2018-02-13 DIAGNOSIS — Z7982 Long term (current) use of aspirin: Secondary | ICD-10-CM

## 2018-02-13 DIAGNOSIS — I169 Hypertensive crisis, unspecified: Secondary | ICD-10-CM | POA: Diagnosis present

## 2018-02-13 LAB — ECHOCARDIOGRAM COMPLETE
HEIGHTINCHES: 73 in
WEIGHTICAEL: 4821.9 [oz_av]

## 2018-02-13 LAB — HIV ANTIBODY (ROUTINE TESTING W REFLEX): HIV SCREEN 4TH GENERATION: NONREACTIVE

## 2018-02-13 LAB — COMPREHENSIVE METABOLIC PANEL
ALBUMIN: 3.5 g/dL (ref 3.5–5.0)
ALT: 33 U/L (ref 0–44)
ANION GAP: 8 (ref 5–15)
AST: 24 U/L (ref 15–41)
Alkaline Phosphatase: 75 U/L (ref 38–126)
BILIRUBIN TOTAL: 0.7 mg/dL (ref 0.3–1.2)
BUN: 13 mg/dL (ref 6–20)
CO2: 25 mmol/L (ref 22–32)
Calcium: 8.9 mg/dL (ref 8.9–10.3)
Chloride: 107 mmol/L (ref 98–111)
Creatinine, Ser: 1.23 mg/dL (ref 0.61–1.24)
GFR calc Af Amer: >60 mL/min (ref >60)
GFR calc non Af Amer: >60 mL/min (ref >60)
GLUCOSE: 99 mg/dL (ref 70–99)
POTASSIUM: 3.7 mmol/L (ref 3.5–5.1)
SODIUM: 140 mmol/L (ref 135–145)
Total Protein: 7.1 g/dL (ref 6.5–8.1)

## 2018-02-13 LAB — CBC
HEMATOCRIT: 49.3 % (ref 39.0–52.0)
HEMOGLOBIN: 15.7 g/dL (ref 13.0–17.0)
MCH: 27.6 pg (ref 26.0–34.0)
MCHC: 31.8 g/dL (ref 30.0–36.0)
MCV: 86.8 fL (ref 78.0–100.0)
Platelets: 300 10*3/uL (ref 150–400)
RBC: 5.68 MIL/uL (ref 4.22–5.81)
RDW: 14 % (ref 11.5–15.5)
WBC: 10.5 10*3/uL (ref 4.0–10.5)

## 2018-02-13 LAB — TROPONIN I: TROPONIN I: 0.08 ng/mL — AB (ref <0.03)

## 2018-02-13 LAB — GLUCOSE, CAPILLARY: Glucose-Capillary: 122 mg/dL — ABNORMAL HIGH (ref 70–99)

## 2018-02-13 MED ORDER — METOPROLOL TARTRATE 25 MG PO TABS
25.0000 mg | ORAL_TABLET | Freq: Two times a day (BID) | ORAL | Status: DC
  Administered 2018-02-13 (×2): 25 mg via ORAL
  Filled 2018-02-13 (×2): qty 1

## 2018-02-13 MED ORDER — AMLODIPINE BESYLATE 10 MG PO TABS: 10.0000 mg | ORAL_TABLET | Freq: Every day | ORAL | 0 refills | Status: DC

## 2018-02-13 MED ORDER — FENTANYL CITRATE (PF) 100 MCG/2ML IJ SOLN
12.5000 ug | Freq: Once | INTRAMUSCULAR | Status: AC
  Administered 2018-02-13: 12.5 ug via INTRAVENOUS
  Filled 2018-02-13: qty 2

## 2018-02-13 MED ORDER — AMLODIPINE BESYLATE 5 MG PO TABS
5.0000 mg | ORAL_TABLET | Freq: Once | ORAL | Status: AC
  Administered 2018-02-13: 5 mg via ORAL
  Filled 2018-02-13: qty 1

## 2018-02-13 MED ORDER — LABETALOL HCL 5 MG/ML IV SOLN: 15.0000 mg | Freq: Once | INTRAVENOUS | Status: DC | PRN

## 2018-02-13 MED ORDER — LABETALOL HCL 5 MG/ML IV SOLN
INTRAVENOUS | Status: AC
  Filled 2018-02-13: qty 4

## 2018-02-13 MED ORDER — METOPROLOL TARTRATE 50 MG PO TABS: 50.0000 mg | ORAL_TABLET | Freq: Two times a day (BID) | ORAL | 2 refills | Status: DC

## 2018-02-13 MED ORDER — METOPROLOL TARTRATE 5 MG/5ML IV SOLN: 5.0000 mg | INTRAVENOUS | Status: DC | PRN

## 2018-02-13 MED ORDER — NITROGLYCERIN IN D5W 200-5 MCG/ML-% IV SOLN
INTRAVENOUS | Status: AC
  Filled 2018-02-13: qty 250

## 2018-02-13 MED ORDER — PERFLUTREN LIPID MICROSPHERE
1.0000 mL | INTRAVENOUS | Status: AC | PRN
  Administered 2018-02-13: 2 mL via INTRAVENOUS
  Filled 2018-02-13: qty 10

## 2018-02-13 MED ORDER — AMLODIPINE BESYLATE 10 MG PO TABS: 10.0000 mg | ORAL_TABLET | Freq: Every day | ORAL | Status: DC

## 2018-02-13 MED ORDER — ASPIRIN EC 81 MG PO TBEC
81.0000 mg | DELAYED_RELEASE_TABLET | Freq: Every day | ORAL | Status: DC
  Administered 2018-02-13: 81 mg via ORAL
  Filled 2018-02-13: qty 1

## 2018-02-13 MED ORDER — LABETALOL HCL 5 MG/ML IV SOLN: 15.0000 mg | Freq: Once | INTRAVENOUS | Status: DC

## 2018-02-13 MED ORDER — ATORVASTATIN CALCIUM 40 MG PO TABS: 40.0000 mg | ORAL_TABLET | Freq: Every day | ORAL | 0 refills | Status: DC

## 2018-02-13 MED ORDER — AMLODIPINE BESYLATE 5 MG PO TABS: 5.0000 mg | ORAL_TABLET | Freq: Every day | ORAL | Status: DC

## 2018-02-13 MED ORDER — PERFLUTREN LIPID MICROSPHERE
1.0000 mL | INTRAVENOUS | Status: DC | PRN
  Filled 2018-02-13: qty 10

## 2018-02-13 MED ORDER — PERFLUTREN LIPID MICROSPHERE
INTRAVENOUS | Status: AC
  Administered 2018-02-13: 2 mL via INTRAVENOUS
  Filled 2018-02-13: qty 10

## 2018-02-13 MED ORDER — NICOTINE 14 MG/24HR TD PT24: 14.0000 mg | MEDICATED_PATCH | Freq: Every day | TRANSDERMAL | 0 refills | Status: DC | PRN

## 2018-02-13 MED ORDER — AMLODIPINE BESYLATE 5 MG PO TABS
5.0000 mg | ORAL_TABLET | Freq: Every day | ORAL | Status: DC
  Administered 2018-02-13: 5 mg via ORAL
  Filled 2018-02-13: qty 1

## 2018-02-13 NOTE — Progress Notes (Signed)
Subjective: Samuel Decker reports feeling much better this morning compared to yesterday and last night. He has not had any chest pain since the rapid response was called around 2 am. When he developed chest pain last night, he notes it was only around his right rib. Earlier while in the ED, he developed chest pain over his left rib. He has not had any shortness of breath or radiating pain. He is glad to see that his blood pressure is improving. His headache that he developed after nitroglycerin has resolved. He mentions his L shoulder numbness and pain that occurs with raising his L arm and is associated with hand numbness.   Objective: Vital signs in last 24 hours: Vitals:   02/13/18 0400 02/13/18 0600 02/13/18 0759 02/13/18 1156  BP: (!) 153/113 (!) 157/105 (!) 166/107 (!) 183/128  Pulse: 65 64 70 77  Resp: 19 19 20  (!) 24  Temp:  97.6 F (36.4 C) 97.6 F (36.4 C) 98.4 F (36.9 C)  TempSrc:   Oral Oral  SpO2: 98% 100% 98% 97%  Weight:  (!) 136.7 kg    Height:       Weight change:   Intake/Output Summary (Last 24 hours) at 02/13/2018 1329 Last data filed at 02/13/2018 0948 Gross per 24 hour  Intake 125 ml  Output 800 ml  Net -675 ml   BP (!) 183/128 (BP Location: Left Arm) Comment: nurse notified  Pulse 77   Temp 98.4 F (36.9 C) (Oral)   Resp (!) 24   Ht 6\' 1"  (1.854 m)   Wt (!) 136.7 kg   SpO2 97%   BMI 39.76 kg/m  General appearance: Alert and friendly, lying in bed, visiting with multiple friends in room  Lungs: clear to auscultation bilaterally, comfortable work of breathing on room air Chest wall: no tenderness, no evidence of swelling Heart: regular rate and rhythm, S1, S2 normal, no murmur, click, rub or gallop Extremities: extremities normal, atraumatic, no cyanosis or edema, no tingling or numbness in L arm   Lab Results: A1C: 5.5  Troponins: 0.07, 0.07, 0.08   Lipid Panel     Component Value Date/Time   CHOL 200 02/12/2018 1826   TRIG 190 (H) 02/12/2018  1826   HDL 32 (L) 02/12/2018 1826   CHOLHDL 6.3 02/12/2018 1826   VLDL 38 02/12/2018 1826   LDLCALC 130 (H) 02/12/2018 1826   CMP Latest Ref Rng & Units 02/13/2018 02/12/2018 03/01/2015  Glucose 70 - 99 mg/dL 99 90 161(W117(H)  BUN 6 - 20 mg/dL 13 13 8   Creatinine 0.61 - 1.24 mg/dL 9.601.23 4.54(U1.25(H) 9.811.20  Sodium 135 - 145 mmol/L 140 140 141  Potassium 3.5 - 5.1 mmol/L 3.7 4.4 3.8  Chloride 98 - 111 mmol/L 107 109 105  CO2 22 - 32 mmol/L 25 21(L) -  Calcium 8.9 - 10.3 mg/dL 8.9 9.3 -  Total Protein 6.5 - 8.1 g/dL 7.1 - -  Total Bilirubin 0.3 - 1.2 mg/dL 0.7 - -  Alkaline Phos 38 - 126 U/L 75 - -  AST 15 - 41 U/L 24 - -  ALT 0 - 44 U/L 33 - -    Studies/Results: [place] Chest Soft Tissue  Result Date: 02/13/2018 CLINICAL DATA:  37 year old male with a history of palpable chest abnormality at the anterior left chest EXAM: ULTRASOUND OF HEAD/NECK SOFT TISSUES TECHNIQUE: Ultrasound examination of the head and neck soft tissues was performed in the area of clinical concern. COMPARISON:  None. FINDINGS: Grayscale and color  duplex ultrasound performed. No soft tissue lesion.  No focal fluid.  No lymphadenopathy. IMPRESSION: Unremarkable sonographic survey in the region of clinical concern Electronically Signed   By: Gilmer MorJaime  Wagner D.O.   On: 02/13/2018 09:05   Echo 02/13/2018  LV with moderate concentric hypertrophy. EF 60-65%. No wall motion abnormalities.   Medications: I have reviewed the patient's current medications. Scheduled Meds: . [START ON 02/14/2018] amLODipine  10 mg Oral Daily  . aspirin EC  81 mg Oral Daily  . atorvastatin  40 mg Oral q1800  . enoxaparin (LOVENOX) injection  40 mg Subcutaneous Q24H  . lidocaine  1 patch Transdermal Q24H  . metoprolol tartrate  25 mg Oral BID   Continuous Infusions: PRN Meds:.acetaminophen **OR** acetaminophen, hydrOXYzine, labetalol, nicotine, nitroGLYCERIN, senna-docusate Assessment/Plan: Principal Problem:   Hypertensive urgency Active Problems:    History of prediabetes  Samuel Decker is a 37 yo man with PMHx of severe uncontrolled HTN, asthma, gout and legal blindness who was admitted on 8/14 due to hypertensive urgency as well as atypical chest pain.   Hypertensive Urgency  Chronic, untreated hypertension with blood pressure elevated to 220s/150s on presentation. No evidence of acute end organ damage with reassuring labs and exam. Chronic end organ damage given legal blindness.  Blood pressure improving to 150s-180s systolic after multiple medications started yesterday with goal of not dropping blood pressure too rapidly. Given patient's age and long history of hypertension, evaluating for secondary causes of hypertension including renovascular disease.  - Increase to 10 mg amlodipine  - Continue Metoprolol 25 mg  - Renal US pending  - Renin and aldosterone pending    Atypical Chest Pain  History of presenting with episodic chest pain for several years. Cardiac workup was initiated given multiple risk factors including severe hypertension, history of prediabetes, obesity, current tobacco use and family history of heart disease. Had 2 episodes of chest pain since presenting to the ED yesterday. Appears tearful and anxious preceding these episodes. No changes were noted on repeat EKGs compared to his baseline. Troponins stable at 0.07, 0.07 and 0.08. Echo showed evidence of left ventricular concentric hypertrophy, but no valvular or wall motion abnormalities. US of his chest wall (due to concern of reported swelling that occurs with chest pain) was unremarkable and did not show any soft tissue abnormality). Timeline of symptoms and absence of acute ischemic changes support atypical cause of his chest pain likely anxiety.  - Nitroglycerin prn  - Continue telemetry  Anxiety  Describes anxiety about not being around for his son due to his health and anxiety about his father dying of cardiac disease. Discussed interest in speaking with therapist as  well as starting medication.  - Recommending fluoxetine  L Shoulder pain  Symptoms that are positional in nature with arm weakness and numbness appear consistent with cervical radiculopathy.  - Cervical spine Xray   Hyperlipidemia  - Start aspirin 81 mg - Continue atorvastatin 40 mg  This is a Psychologist, occupationalMedical Student Note.  The care of the patient was discussed with Dr. Karilyn Cotaehman and the assessment and plan formulated with their assistance.  Please see their attached note for official documentation of the daily encounter.   LOS: 0 days   Jarom Govan, Franne FortsMadeleine M, Medical Student 02/13/2018, 1:29 PM

## 2018-02-13 NOTE — Significant Event (Signed)
Rapid Response Event Note  Called by Samuel Decker for patient with chest pain  Overview: Time Called: 0113 Arrival Time: 0118 Event Type: Cardiac  Initial Focused Assessment: Upon arrival, [name] was lying in bed.  Alert, oriented x4 and emotional.  He states he is having 8/10 CP substernal to the right side of his chest.  He denies SOB.  He denies any further radiation of pain.  He also stated he has numbness in the fingers of his left hand which fluctuates.  He has experienced the numbness on and off for 2 weeks.  His skin is pink, diaphoretic and warm.  Cap refill is normal.  Peripheral pulses are equal x4. BBS CTA.  12 lead EKG was completed and shows NSR with no acute ST elevation.  HR 84, BP 223/130 in Left arm, RR 16 and sats 100% on RA.  BP in Left arm was 203/123.  Multiple interventions were done previously for BP including Lopressor 25mg  PO and hydralazine. CP is improving with SL NTG.  Dr. Crista ElliotHarbrecht at bedside.  Interventions: -Notify primary svc of events.  Dr. Crista ElliotHarbrecht came to bedside -Stat EKG (done)   Previous PRN order -SL NTG q5 mins x 3 (done)  Previous PRN order -new PIV  -Tylenol for HA and fentanyl 12.5 mcg IV  Plan of Care (if not transferred): -Monitor for further CP -Notify primary svc and/or RRRN for further clinical decompensations -Labetolol 15mg  IV PRN for SBP >180  Event Summary: CP 3/10 and coming down. BP 174/115.  Goal is SBP 180 for next couple of hours.  [name], Samuel Decker

## 2018-02-13 NOTE — Progress Notes (Signed)
Patient called  The nurse and patient noted to be in severe chest pain on the scale of 8/10. BP systolic BP 223/123. Rapid called and MD notified.

## 2018-02-13 NOTE — Progress Notes (Signed)
  Echocardiogram 2D Echocardiogram has been performed with Definity.  Gerda Dissrthur L Spero Gunnels 02/13/2018, 11:27 AM

## 2018-02-13 NOTE — Discharge Instructions (Signed)
[name],   You were admitted to the hospital because of very elevated blood pressures as well as chest pain. The workup we did was reassuring that there was not any acute changes to how your heart was functioning.  While you were hospitalized, your high blood pressures improved with starting some medications. We also did some tests to help reduce your further risk of having cardiac disease, which showed that your cholesterol is a little bit high so we started some medicines to help keep your cholesterol low.   Changes to your medications include:  START taking Amlodipine 10 mg daily START taking Metoprolol 50 mg twice a day  START taking Aspirin 81 mg daily  START taking Atorvastatin 40 mg daily   We [facility] on Monday, August 19th at 10:45 am.   Please call the clinic or go to the emergency department sooner if you experience more chest pain that feels like squeezing, pressure or tightness. Other reasons to come to the emergency department include having chest pain that radiates to your jaw or arm, increased sweating, or if you have shortness of breath or lightheadedness.   Thank you for letting us be part of your care.

## 2018-02-13 NOTE — Progress Notes (Signed)
Physician on call came and assessed patient, chest pain relieved with 3 nitro, but developed headache afterwards and complains of left hand numbness and weakness. Physician in the room assessing patient. Tylenol 650 mg given to relieve headache. While the physician was in the room patient started crying with chest pain. Fentanyl 12.5mg  IV given. BP trending down to lowest systolic  since admission to 174. Order for BP medication given with parameters. Patient said pain is subsided.Nurse will continue to monitor.

## 2018-02-13 NOTE — H&P (Signed)
Internal Medicine Attending Admission Note Date: 02/13/2018  Patient name: Samuel Decker Medical record number: [id number] Date of birth: [birth year] Age: 37 y.o. Gender: male  I saw and evaluated the patient. I reviewed the resident's note and I agree with the resident's findings and plan as documented in the resident's note.  Chief Complaint(s): Elevated blood pressure, chest pain, and anxiety  History - key components related to admission:  Samuel Decker is a 37 year old man with a history of morbid obesity, long-standing hypertension, gout, and legal blindness who [facility] to [facility] primary care. In the clinic he was found to have a blood pressure of 216/135. They were concerned this represented an acute hypertensive crisis and admitted him to the internal medicine teaching service for further evaluation and care. Unfortunately, after being told he was being admitted to the hospital he developed the sudden onset of chest pain. The pain started under the left breast and did not radiate. It was described as a 10/10 pain without associated nausea, diaphoresis, or shortness of breath.   Upon further questioning he states similar chest pain has been occurring over the last 2-3 weeks that can come on at rest or with movement. There are times it is left-sided pain and there are times it is right sided pain. He states he has never had pain in both the right and left chest at the same time. This pain is relieved by leaning forward or trying to relax. On the day of admission, the pain was exacerbated by touching the left chest wall. Sometimes during these events, the patient will become tearful with frank crying.  Physical Exam - key components related to admission:  Vitals:   02/13/18 0800 02/13/18 1000 02/13/18 1156 02/13/18 1200  BP: (!) 162/112 (!) 169/116 (!) 183/128 (!) 183/128  Pulse: 66 66 77 72  Resp: 16 (!) 23 (!) 24 (!) 21  Temp:   98.4 F (36.9 C)    TempSrc:   Oral   SpO2: 97% 98% 97% 97%  Weight:      Height:       Gen.: Well-developed, well-nourished, morbidly obese man lying comfortably in bed in no acute distress. He is in excellent spirits. Lungs: Clear to auscultation bilaterally without wheezes, rhonchi, or rales. Heart: Regular rate and rhythm without murmurs, rubs, or gallops. Abdomen: Soft, nontender, without guarding or rebound. Extremities: Without edema.  Lab results:  Basic Metabolic Panel: Recent Labs    02/12/18 1503 02/13/18 0332  NA 140 140  K 4.4 3.7  CL 109 107  CO2 21* 25  GLUCOSE 90 99  BUN 13 13  CREATININE 1.25* 1.23  CALCIUM 9.3 8.9   Liver Function Tests: Recent Labs    02/13/18 0332  AST 24  ALT 33  ALKPHOS 75  BILITOT 0.7  PROT 7.1  ALBUMIN 3.5   CBC: Recent Labs    02/12/18 1503 02/13/18 0332  WBC 10.7* 10.5  HGB 17.0 15.7  HCT 53.5* 49.3  MCV 86.9 86.8  PLT 300 300   Cardiac Enzymes: Recent Labs    02/12/18 1633 02/12/18 2210 02/13/18 0332  TROPONINI 0.07* 0.07* 0.08*   CBG: Recent Labs    02/13/18 0050  GLUCAP 122*   Hemoglobin A1C: Recent Labs    02/12/18 1826  HGBA1C 5.5   Fasting Lipid Panel: Recent Labs    02/12/18 1826  CHOL 200  HDL 32*  LDLCALC 130*  TRIG 190*  CHOLHDL 6.3   Thyroid  Function Tests: Recent Labs    02/12/18 1826  TSH 0.598   Anemia Panel: Recent Labs    02/12/18 1826  RETICCTPCT 1.4   Urine Drug Screen:  Positive for THC Negative for opiates, cocaine, benzodiazepines, amphetamines, and barbiturates  Urinalysis:  Clear, straw-colored, specific gravity 1.013, pH 6.0, hemoglobin small, ketones 5, nitrite negative, leukocytes negative, red blood cells 6-10 per high-power field, white blood cells 6-10 per high-power field.  Misc. Labs:  HIV antibody nonreactive  Imaging results:   PA and lateral chest x-ray: No effusions, infiltrates, or masses. There is loss of the thoracic or lumbar lordosis. This is  unchanged from the previous chest x-ray on 06/15/2014.  Soft tissue ultrasound of the left chest: Please see the radiologist's interpretation and the radiology section.  Cervical spine x-ray: Pending  Other results:  EKG:  Personally reviewed. Normal sinus rhythm at 81 bpm, normal axis, normal intervals, no significant Q waves, no LVH by voltage, delayed R wave progression, 1 mm ST elevation in V2-V3, 1 mm ST depression in V6, deep inferolateral T wave inversions. This ECG is unchanged from the prior ECG on 03/01/2015.  Assessment & Plan by Problem:  Samuel Decker is a 37 year old man with a history of morbid obesity, long-standing hypertension, gout, and legal blindness who [facility] to establish primary care. In the clinic he was found to have a blood pressure of 216/135. He has a long history of chronic hypertension and has not been taking his medications recently. I believe his presentation was more consistent with chronic poorly controlled hypertension then a hypertensive emergency. He is palpably anxious and when told he needed admission to the hospital developed an anxiety attack. The associated chest pain is likely not cardiac given that it is very atypical, is inconsistent with regards to the side of the chest in which it is on, does not occur with exertion but can occur at any time, and is relieved with sitting forward or relaxing. The low level tropanemia has been stable and is likely related to heart strain from a very high afterload.  1) Chronic hypertension: Poorly controlled. We will start amlodipine 10 mg by mouth daily and metoprolol 50 mg by mouth twice daily. He was on this regimen in the past. He will be discharged on this regimen and further titration of these medications or add additional medications can occur in the outpatient clinic. The patient was informed that it may take several weeks for the blood pressure to reach targett, but this was  preferable as a rapid decreases in blood pressure, which can occur in the inpatient setting, could be dangerous including resulting in a stroke. His hypertension is long-standing and began as a child. We are therefore looking for secondary causes of hypertension including hyperaldosteronism and he has an aldosterone level and plasma renin activity level pending. We also wanted to get a renal ultrasound with duplex to look for renal artery stenosis. Unfortunately, this test could not be scheduled as an inpatient. Therefore, quite appropriately, it can be done as an outpatient.  2) Anxiety: We are discussing the options of pharmacotherapy for his significant anxiety to include an SSRI or SNRI. I think this would be very important given what we have observed as an inpatient and may help the patient with regards to quality of life once it is effective. His concerns in the past have been related to sexual dysfunction. We are exploring what his priorities are at this time. If  he should want to try pharmacologic management at this time he will be discharged home with an anxiolytic and this can be further titrated as an outpatient.  3) Gout: As he has recently had an acute attack it would not be appropriate to start allopurinol at this time. This can be done in the outpatient setting.  4) Morbid obesity: He would benefit from outpatient nutritional counseling. We will make this recommendation to his primary care provider.  5) Possible left upper extremity cervical radiculopathy: We are obtaining a cervical spine film to assess for degenerative joint disease or disc space disease. Further evaluation, including an MRI if appropriate, can be done in the outpatient setting.  5) Disposition: He is stable for discharge home today with further outpatient titration of his antihypertensives over the next several weeks in order to achieve target blood pressure. In the outpatient setting a duplex renal ultrasound can be  obtained to assess for renal artery stenosis. In addition, the outpatient clinic can follow-up on the aldosterone and plasma renal and activity levels that are pending at this time.  In addition, work on his anxiety and further evaluation of his left arm numbness and management of his gout can be done in the outpatient clinic. I suspect he will require numerous outpatient visits to work on each of these individual issues, but this can be easily done in the outpatient setting.

## 2018-02-13 NOTE — Discharge Summary (Addendum)
Name: Samuel Decker MRN: [id number] DOB: 1980-10-09 37 y.o. PCP: Samuel Decker  Date of Admission: 02/12/2018  2:52 PM Date of Discharge: 02/13/18 Attending Physician: Samuel Decker  Discharge Diagnosis: 1. Hypertension 2. Atypical Chest Pain  3. Anxiety  4. L Shoulder Pain  5. Hyperlipidemia   Discharge Medications: Allergies as of 02/13/2018      Reactions   Buprenorphine Hcl Itching, Swelling   Morphine And Related Itching, Swelling      Medication List    STOP taking these medications   ibuprofen 200 MG tablet Commonly known as:  ADVIL,MOTRIN     TAKE these medications   amLODipine 10 MG tablet Commonly known as:  NORVASC Take 1 tablet (10 mg total) by mouth daily. Start taking on:  02/14/2018 What changed:    medication strength  how much to take   aspirin 81 MG chewable tablet Chew 1 tablet (81 mg total) by mouth daily.   atorvastatin 40 MG tablet Commonly known as:  LIPITOR Take 1 tablet (40 mg total) by mouth daily at 6 PM.   metoprolol tartrate 50 MG tablet Commonly known as:  LOPRESSOR Take 1 tablet (50 mg total) by mouth 2 (two) times daily.   nicotine 14 mg/24hr patch Commonly known as:  NICODERM CQ - dosed in mg/24 hours Place 1 patch (14 mg total) onto the skin daily as needed (tobacco craving).       Disposition and follow-up:   Samuel Decker was discharged [facility] in Stable condition.  At the hospital follow up visit please address:  1. Blood pressure control on new antihypertensives. Admitted due to hypertensive urgency. He was not on therapy prior to hospitalization. BP responsive to antihypertensives and discharged on Amlodipine 10 mg and Metoprolol 50 mg BID.   2. Recurrence of Chest Pain: Admitted with atypical chest pain that is chronic and episodic. His cardiac workup including stable troponins, no acute EKG changes, and ECHO showing no wall motion abnormality were reassuring.    3.  Anxiety: Associated with chest pain; He is interested in further treatment options.   2.  Labs / imaging needed at time of follow-up: BP check   3.  Pending labs/ test needing follow-up: Renin-Aldosterone level pending; Duplex Renal US to assess for renovascular disease  Follow-up Appointments: Follow-up Information    [facility]. Go on 02/17/2018.   Why:  Appointment at the internal medicine clinic on Monday, August 19th at 10:45 am Contact information: [address] [place] [phone]          Hospital Course by problem list: Samuel Decker is a 37 yo man with PMHx of HTN, asthma, gout and blindness presenting to the ED from clinic on 08/14 for management of hypertensive urgency and atypical chest pain.    Chronic Hypertension Presented with blood pressures of 220s/150s. He has chronic history of severe range blood pressures with complication of vision loss likely due to hypertension. He was not taking any antihypertensives prior to admission. There was no evidence to suggest acute end organ damage from his hypertension with reassuring BMP, stable troponins and non-focal physical exam.   Long history of hypertension concerning for possible secondary cause including renovascular disease. Ordered outpatient renal ultrasound as well as aldosterone and renin level to evaluate for renal artery stenosis.   Atypical Chest Pain  Episodic chest pain over the past several weeks as well as history of similar symptoms  dating back to 2011.  Given this timeline, presentation appears to be non-cardiac in nature and associated with acute anxiety. However, given severe hypertension and multiple risk factors for ACS we pursued workup to rule out cardiac etiology. Echo on 8/15 showed evidence of LVH, but did not show wall motion abnormality or valvular disease.  Serial troponins were stable, but slightly elevated likely due to LVH and hypertension. Patient  was on telemetry during hospitalization and repeat EKGs were performed during episode of chest pain that did not show any changes compared to baseline.   Anxiety  Describes anxiety about not being around for his son due to his health and anxiety about his father dying of cardiac disease. Increased stress the past 3 years after his father passed. Endorses symptoms of both anxiety and depression and reports using tobacco and alcohol as a coping mechanism.  He is interested in speaking with therapist as well as starting medication. Discussed SSRIs as option and this is something he is interested in starting as an outpatient.   L shoulder Pain  Symptoms distinct from chest pain. Pain consistent with cervical radiculopathy. He experiences numbness and weakness when moving his L arm up above head. Chronic per chart review with similar presentation in 2016. Xray of shoulder in 2016 was unremarkable and venous duplex ultrasound did not show evidence of deep vein or superficial thrombosis in the left upper extremity. An Xray of his cervical spine on 8/15 indicated mild C6-7 degenerative changes.    Tobacco and Alcohol Use  Currently smokes 1 ppd (3 pack year history). Describes significant alcohol use including ~ 1/2 of a handle or about 30 oz of liquor on the weekend.  He was on CIWA protocol while hospitalized, but did not require any ativan. He is interested in smoking cessation.  Hyperlipidemia  History of hyperlipidemia dating back to 2011 per chart review. No history of medical management. Repeat lipid panel showed hyperlipidemia and he was started on 40 mg atorvastatin.   Discharge Vitals:   BP (!) 183/128   Pulse 72   Temp 98.4 F (36.9 C) (Oral)   Resp (!) 21   Ht 6\' 1"  (1.854 m)   Wt (!) 136.7 kg   SpO2 97%   BMI 39.76 kg/m   Pertinent Labs, Studies, and Procedures:  Troponins: 0.07, 0.07, 0.08   Lipid Panel     Component Value Date/Time   CHOL 200 02/12/2018 1826   TRIG 190 (H)  02/12/2018 1826   HDL 32 (L) 02/12/2018 1826   CHOLHDL 6.3 02/12/2018 1826   VLDL 38 02/12/2018 1826   LDLCALC 130 (H) 02/12/2018 1826   A1C: 5.5   Initial EKG: Normal sinus rhythm, evidence of LAE in V1, T wave inversions noted in leads I,II, aVL, aVF, V4, V5 and V6. Compared to EKG in 2016, diffuse T wave inversions are unchanged.  Repeat EKGs after episodes of chest pain did not show any significant changes.   Echo 02/13/2018  LV with moderate concentric hypertrophy. EF 60-65%. No wall motion abnormalities.   Chest Soft Tissue US 02/13/2018 No soft tissue lesion or focal fluid.   Cervical Xray 02/13/2018  Mild C6-7 degenerative disease.   Discharge Instructions: Discharge Instructions    Call Decker for:  difficulty breathing, headache or visual disturbances   Complete by:  As directed    Call Decker for:  extreme fatigue   Complete by:  As directed    Call Decker for:  hives   Complete by:  As directed    Call Decker for:  persistant dizziness or light-headedness   Complete by:  As directed    Call Decker for:  persistant nausea and vomiting   Complete by:  As directed    Call Decker for:  redness, tenderness, or signs of infection (pain, swelling, redness, odor or green/yellow discharge around incision site)   Complete by:  As directed    Call Decker for:  severe uncontrolled pain   Complete by:  As directed    Call Decker for:  temperature >100.4   Complete by:  As directed    Diet - low sodium heart healthy   Complete by:  As directed    Increase activity slowly   Complete by:  As directed      Mr. Thornell SartoriusSellers,   You were admitted to the hospital because of very elevated blood pressures as well as chest pain. The workup we did was reassuring that there was not any acute changes to how your heart was functioning.  While you were hospitalized, your high blood pressures improved with starting some medications. We also did some tests to help reduce your further risk of having cardiac disease, which  showed that your cholesterol is a little bit high so we started some medicines to help keep your cholesterol low.   Changes to your medications include:  START taking Amlodipine 10 mg daily START taking Metoprolol 50 mg twice a day  START taking Aspirin 81 mg daily  START taking Atorvastatin 40 mg daily   We scheduled you a follow up appointment with [facility] on Monday, August 19th at 10:45 am.   Please call the clinic or go to the emergency department sooner if you experience more chest pain that feels like squeezing, pressure or tightness. Other reasons to come to the emergency department include having chest pain that radiates to your jaw or arm, increased sweating, or if you have shortness of breath or lightheadedness.   Thank you for letting us be part of your care.   SignedKarilyn Cota: Ellen Mayol DO 02/13/18 4:47 PM MY PAGER: [phone]

## 2018-02-13 NOTE — Progress Notes (Signed)
Paged for hypertension w/ associated chest pain. Rapid response was called. Notified that BP was ~ equal in both arms >220 systolic associated with chest pain. Was given metoprolol tartrate 25mg  ~2045min prior (peak affect ~1-2hrs). Appears to have failed Hydralazine earlier.   Once at bedside we evaluated the patient who appeared stable at rest in his bed in no acute distress, afebrile, mildly diaphoretic, . He stated that his chest pain had greatly improved by that time. EKG that was repeated following the return of the chest pain reoccurred was reviewed and appeared grossly similar to prior EKG's. Vitals stable with BP 203/123 following 3 appropriately spaced NTG sublingual, the last of which was given just prior to our arrival. Repeat BP 12min following the third dose indicated a Systolic ~180, which was approximately the immediate goal BP rate of change for symptomatic hypertension. After brief chart review and a 15min wait to allow the NGT and metoprolol to have time to take affect, a BP was repeated and remained near 180 systolic. The patient had endured a severe headache following the second dose of NTG causing hesitancy with regard to progressing to NTG gtt. Further evaluation failed to reveal an acute cause of the pain, cardiac signs with clear S1-2, lung cta bilaterally, the patient appeared severely anxious initially but appeared comfortable and much more relaxed following completion of the evaluation and treatment. I feel there is a notable anxiety component to his current presentation and symptoms.   Patient HR ~80bpm, SpO2 99% on room air. 2210 hour troponin 0.07, TSH 0.598 WNL's.  Repeat Sys BP was 179. Ordered labetalol 15mg  Once PRN for Sys BP >190. Goal of ~180 to be maintained over the next 4-5 hours. One dose of fentanyl 12.725mcg due to persistent pain/anxiety. Will consider NTG GTT only if BP refractory to continued conservative therapy.   Lanelle BalLawrence Calisa Luckenbaugh, MD PGY-2, [phone]

## 2018-02-17 ENCOUNTER — Other Ambulatory Visit: Payer: Self-pay

## 2018-02-17 ENCOUNTER — Ambulatory Visit (INDEPENDENT_AMBULATORY_CARE_PROVIDER_SITE_OTHER): Payer: Medicare HMO | Admitting: Internal Medicine

## 2018-02-17 ENCOUNTER — Encounter: Payer: Self-pay | Admitting: Internal Medicine

## 2018-02-17 VITALS — BP 187/113 | HR 66 | Temp 97.9°F | Ht 73.0 in | Wt 305.6 lb

## 2018-02-17 DIAGNOSIS — I10 Essential (primary) hypertension: Secondary | ICD-10-CM

## 2018-02-17 DIAGNOSIS — E669 Obesity, unspecified: Secondary | ICD-10-CM | POA: Diagnosis not present

## 2018-02-17 DIAGNOSIS — R69 Illness, unspecified: Secondary | ICD-10-CM | POA: Diagnosis not present

## 2018-02-17 DIAGNOSIS — R7303 Prediabetes: Secondary | ICD-10-CM | POA: Diagnosis not present

## 2018-02-17 DIAGNOSIS — F419 Anxiety disorder, unspecified: Secondary | ICD-10-CM

## 2018-02-17 DIAGNOSIS — M109 Gout, unspecified: Secondary | ICD-10-CM

## 2018-02-17 DIAGNOSIS — Z79899 Other long term (current) drug therapy: Secondary | ICD-10-CM

## 2018-02-17 DIAGNOSIS — Z6841 Body Mass Index (BMI) 40.0 and over, adult: Secondary | ICD-10-CM | POA: Diagnosis not present

## 2018-02-17 DIAGNOSIS — F17210 Nicotine dependence, cigarettes, uncomplicated: Secondary | ICD-10-CM

## 2018-02-17 DIAGNOSIS — R0683 Snoring: Secondary | ICD-10-CM

## 2018-02-17 DIAGNOSIS — E785 Hyperlipidemia, unspecified: Secondary | ICD-10-CM | POA: Diagnosis not present

## 2018-02-17 DIAGNOSIS — F329 Major depressive disorder, single episode, unspecified: Secondary | ICD-10-CM

## 2018-02-17 LAB — ALDOSTERONE + RENIN ACTIVITY W/ RATIO
ALDO / PRA Ratio: 8 (ref 0.0–30.0)
ALDOSTERONE: 3.1 ng/dL (ref 0.0–30.0)
PRA LC/MS/MS: 0.387 ng/mL/hr (ref 0.167–5.380)

## 2018-02-17 MED ORDER — SERTRALINE HCL 25 MG PO TABS: 25.0000 mg | ORAL_TABLET | Freq: Every day | ORAL | 0 refills | Status: DC

## 2018-02-17 MED ORDER — LOSARTAN POTASSIUM-HCTZ 50-12.5 MG PO TABS: 1.0000 | ORAL_TABLET | Freq: Every day | ORAL | 0 refills | Status: DC

## 2018-02-17 NOTE — Patient Instructions (Addendum)
It was a pleasure to see you today Ms. Gongaware. Please make the following changes:  -Please stop taking metoprolol -Please start taking losartan-hydrochlorothiazide 50-12.5 mg once a day -Continue taking amlodipine 10 mg daily -Please start taking sertraline 25 mg daily for your anxiety -Please try to limit the amount of salt in your diet and exercise at least 30 minutes daily -Will be called regarding a renal ultrasound and sleep study to check for sleep apnea -Follow-up in 2 weeks  If you have any questions or concerns, please call our clinic at [phone] between 9am-5pm and after hours call [phone] and ask for the internal medicine resident on call. If you feel you are having a medical emergency please call 911.   Thank you, we look forward to help you remain healthy!  Lorenso CourierVahini Kila Godina, MD Internal Medicine PGY2

## 2018-02-17 NOTE — Progress Notes (Signed)
CC: Hypertension follow-up  HPI:  [name] is a 37 y.o. Male with hypertension, history of prediabetes, gout, hyperlipidemia who presents for hospital follow-up.  Past Medical History:  Diagnosis Date  . Asthma    Childhood, not active  . Gout    Most recent flare 01/2018  . Hypertension   . Keratoconus of both eyes   . Legally blind    Review of Systems:    Denies blurry vision, chest pain, sob, headaches, abd pain, diarrhea  Physical Exam:  Vitals:   02/17/18 1050  BP: (!) 187/113  Pulse: 66  Temp: 97.9 F (36.6 C)  TempSrc: Oral  SpO2: 99%  Weight: (!) 305 lb 9.6 oz (138.6 kg)  Height: 6\' 1"  (1.854 m)   Physical Exam  Constitutional: He appears well-developed and well-nourished. No distress.  HENT:  Head: Normocephalic and atraumatic.  Eyes: Conjunctivae are normal.  Cardiovascular: Normal rate, regular rhythm and normal heart sounds.  Respiratory: Effort normal and breath sounds normal. No respiratory distress. He has no wheezes.  GI: Soft. Bowel sounds are normal. He exhibits no distension. There is no tenderness.  Musculoskeletal: He exhibits no edema or tenderness.  Right first toe swollen, tender to palpation, non erythematous  Neurological: He is alert.  Skin: He is not diaphoretic. No erythema.  Psychiatric: He has a normal mood and affect. His behavior is normal. Judgment and thought content normal.    Assessment & Plan:   See Encounters Tab for problem based charting.  Chronic hypertension The patient presents to clinic today for hospital follow-up regarding hypertensive crisis.  During hospitalization the patient had work-up for his chest pain which was thought to be atypical in nature with negative EKG, echo and stable troponins.  The patient was resumed on his previous hypertension medications amlodipine 10 mg and metoprolol 50 mg twice daily.  Patient states that he has been adherent to his medications except for missing dose one  time.  Patient's blood pressure during this visit is 187/113,66.  His CMP on 02/13/2018 his creatinine = 1.23, GFR >60, normal LFTs and therefore without any kidney or liver injury.  Lipid panel done on 02/12/2018 shows total cholesterol = 200, triglycerides = 190, HDL = 32, LDL = 130.  She has an ASCVD risk score cannot be calculated due to the patient this age being less than the usual range of 40-75.  He denies any symptoms of headache, shortness of breath, chest pain at this visit.  Assessment and plan Patient has long-standing chronic hypertension that has not been worked up in the past to determine whether it is secondary nature.  Renin to aldosterone ratio was ordered during hospitalization and is still pending.  He also needs renal ultrasound.  His TSH is normal at 0.598.  Other secondary causes of his hypertension include that he smokes 1ppd, his obesity (bmi=40.32), salt heavy diet.  He states that he does not drink coffee but he does drink juices and sweet tea.  Patient has a high stop bang score placing him at risk for OSA.  -Messaged Dr. Selena BattenKim for assistance regarding finding affordable nicotine patch options -Nutrition referral -Split-night study ordered to evaluate for OSA -Discontinued the patient off of metoprolol -Started Losartan-hydrochlorothiazide 50-12.5 mg daily -Follow-up visit in 2 weeks  Anxiety The patient states that he has been very anxious and depressed for the past few months. He has had several stressors including his father passing away and he states that due to the stress he  turns to alcohol, marijuana, and smoking.  She is PHQ 9 was 8 and his GAD 7 was 15.  Assessment and plan  Prescribed the patient sertraline 25 mg daily. Will follow up in 4 weeks to note whether patient has any improvements in mood. Next visit will address whether the patient can benefit from virtual behavioral health visit.   Patient discussed with Dr. Rogelia BogaButcher

## 2018-02-17 NOTE — Progress Notes (Signed)
Internal Medicine Clinic Attending  Case discussed with Dr. Chundi at the time of the visit.  We reviewed the resident's history and exam and pertinent patient test results.  I agree with the assessment, diagnosis, and plan of care documented in the resident's note. 

## 2018-02-18 NOTE — Assessment & Plan Note (Signed)
The patient presents to clinic today for hospital follow-up regarding hypertensive crisis.  During hospitalization the patient had work-up for his chest pain which was thought to be atypical in nature with negative EKG, echo and stable troponins.  The patient was resumed on his previous hypertension medications amlodipine 10 mg and metoprolol 50 mg twice daily.  Patient states that he has been adherent to his medications except for missing dose one time.  Patient's blood pressure during this visit is 187/113,66.  His CMP on 02/13/2018 his creatinine = 1.23, GFR >60, normal LFTs and therefore without any kidney or liver injury.  Lipid panel done on 02/12/2018 shows total cholesterol = 200, triglycerides = 190, HDL = 32, LDL = 130.  She has an ASCVD risk score cannot be calculated due to the patient this age being less than the usual range of 40-75.  He denies any symptoms of headache, shortness of breath, chest pain at this visit.  Assessment and plan Patient has long-standing chronic hypertension that has not been worked up in the past to determine whether it is secondary nature.  Renin to aldosterone ratio was ordered during hospitalization and is still pending.  He also needs renal ultrasound.  His TSH is normal at 0.598.  Other secondary causes of his hypertension include that he smokes 1ppd, his obesity (bmi=40.32), salt heavy diet.  He states that he does not drink coffee but he does drink juices and sweet tea.  Patient has a high stop bang score placing him at risk for OSA.  -Messaged Dr. Selena BattenKim for assistance regarding finding affordable nicotine patch options -Nutrition referral -Split-night study ordered to evaluate for OSA -Discontinued the patient off of metoprolol -Started Losartan-hydrochlorothiazide 50-12.5 mg daily -Follow-up visit in 2 weeks

## 2018-02-18 NOTE — Assessment & Plan Note (Signed)
The patient states that he has been very anxious and depressed for the past few months. He has had several stressors including his father passing away and he states that due to the stress he turns to alcohol, marijuana, and smoking.  She is PHQ 9 was 8 and his GAD 7 was 15.  Assessment and plan  Prescribed the patient sertraline 25 mg daily. Will follow up in 4 weeks to note whether patient has any improvements in mood. Next visit will address whether the patient can benefit from virtual behavioral health visit.

## 2018-02-18 NOTE — Progress Notes (Signed)
Internal Medicine Clinic Attending  Case discussed with Dr. Chundi at the time of the visit.  We reviewed the resident's history and exam and pertinent patient test results.  I agree with the assessment, diagnosis, and plan of care documented in the resident's note. 

## 2018-03-13 ENCOUNTER — Ambulatory Visit: Payer: Medicare HMO | Admitting: Pharmacist

## 2018-03-13 ENCOUNTER — Ambulatory Visit: Payer: Medicare HMO | Admitting: Dietician

## 2018-03-13 ENCOUNTER — Other Ambulatory Visit: Payer: Self-pay | Admitting: Pharmacist

## 2018-03-13 VITALS — BP 159/92 | HR 71

## 2018-03-13 DIAGNOSIS — I10 Essential (primary) hypertension: Secondary | ICD-10-CM

## 2018-03-13 NOTE — Progress Notes (Signed)
S: Samuel Decker is a 37 y.o. male reports to clinical pharmacist appointment for smoking cessation.  Allergies  Allergen Reactions  . Buprenorphine Hcl Itching and Swelling  . Morphine And Related Itching and Swelling   Medication Sig  amLODipine (NORVASC) 10 MG tablet Take 1 tablet (10 mg total) by mouth daily.  aspirin 81 MG chewable tablet Chew 1 tablet (81 mg total) by mouth daily. Patient not taking: Reported on 02/12/2018  atorvastatin (LIPITOR) 40 MG tablet Take 1 tablet (40 mg total) by mouth daily at 6 PM.  losartan-hydrochlorothiazide (HYZAAR) 50-12.5 MG tablet Take 1 tablet by mouth daily.  nicotine (NICODERM CQ - DOSED IN MG/24 HOURS) 14 mg/24hr patch Place 1 patch (14 mg total) onto the skin daily as needed (tobacco craving). Patient not taking: Reported on 03/13/2018  sertraline (ZOLOFT) 25 MG tablet Take 1 tablet (25 mg total) by mouth daily.   Past Medical History:  Diagnosis Date  . Asthma    Childhood, not active  . Gout    Most recent flare 01/2018  . Hypertension   . Keratoconus of both eyes   . Legally blind    Social History   Socioeconomic History  . Marital status: Married    Spouse name: Not on file  . Number of children: Not on file  . Years of education: Not on file  . Highest education level: Not on file  Occupational History  . Occupation: diability  Social Needs  . Financial resource strain: Not on file  . Food insecurity:    Worry: Not on file    Inability: Not on file  . Transportation needs:    Medical: Not on file    Non-medical: Not on file  Tobacco Use  . Smoking status: Current Every Day Smoker    Packs/day: 1.00    Years: 3.00    Pack years: 3.00    Types: Cigarettes  . Smokeless tobacco: Never Used  . Tobacco comment: last cig 02/16/2018  Substance and Sexual Activity  . Alcohol use: Yes    Alcohol/week: 6.0 standard drinks    Types: 6 Shots of liquor per week    Comment: occ  . Drug use: No  . Sexual activity: Not on  file  Lifestyle  . Physical activity:    Days per week: Not on file    Minutes per session: Not on file  . Stress: Not on file  Relationships  . Social connections:    Talks on phone: Not on file    Gets together: Not on file    Attends religious service: Not on file    Active member of club or organization: Not on file    Attends meetings of clubs or organizations: Not on file    Relationship status: Not on file  Other Topics Concern  . Not on file  Social History Narrative  . Not on file   Family History  Problem Relation Age of Onset  . Hypertension Mother   . Hypertension Father   . Diabetes Father   . Heart disease Father   . Hypertension Paternal Grandmother   . Diabetes Paternal Grandmother    O: Component Value Date/Time   CHOL 200 02/12/2018 1826   HDL 32 (L) 02/12/2018 1826   TRIG 190 (H) 02/12/2018 1826   AST 24 02/13/2018 0332   ALT 33 02/13/2018 0332   NA 140 02/13/2018 0332   K 3.7 02/13/2018 0332   CL 107 02/13/2018 0332   CO2  25 02/13/2018 0332   GLUCOSE 99 02/13/2018 0332   HGBA1C 5.5 02/12/2018 1826   BUN 13 02/13/2018 0332   CREATININE 1.23 02/13/2018 0332   CALCIUM 8.9 02/13/2018 0332   GFRNONAA >60 02/13/2018 0332   GFRAA >60 02/13/2018 0332   WBC 10.5 02/13/2018 0332   HGB 15.7 02/13/2018 0332   HCT 49.3 02/13/2018 0332   PLT 300 02/13/2018 0332   TSH 0.598 02/12/2018 1826   TSH 0.512 05/03/2010 1854   Ht Readings from Last 2 Encounters:  02/17/18 6\' 1"  (1.854 m)  02/12/18 6\' 1"  (1.854 m)   Wt Readings from Last 2 Encounters:  03/13/18 298 lb 9.6 oz (135.4 kg)  02/17/18 (!) 305 lb 9.6 oz (138.6 kg)   There is no height or weight on file to calculate BMI. BP Readings from Last 3 Encounters:  03/13/18 (!) 159/92  02/17/18 (!) 187/113  02/13/18 (!) 183/128    A/P: Nicotine dependence: moderate, 3 years duration in a patient who is excellent candidate for success b/c of motivation and general interest in improving his health and  well-being.    Reviewed options and patient reports interest in patches + gum. Provided samples and reviewed with the patient, including name, instructions, goals of therapy, potential side effects, importance of adherence, and safe use.   Reviewed potential challenges and coping skills/strategies with patient. Provided information on 1 800-QUIT NOW support program and advised patient to contact me if questions/concerns arise. Patient verbalized understanding of information by repeating back.  Other concerns reviewed during this appointment: Pain management: patient currently takes ibuprofen PRN, provided patient with topical OTC options (lidocaine and trolamine salicylate), and advised on RICE therapy.  Patient was also provided BP monitor and aspirin 81 mg per his request.  Follow up in 2 weeks or as needed. Patient is due for PCP follow up so advised him to schedule appointment. Patient verbalized understanding. An after visit summary was provided.  Marzetta Board 11:15 AM 03/13/2018

## 2018-03-13 NOTE — Progress Notes (Signed)
Documentation: Discussed lifestyle changes (meal planning, physical activity, smoking cessation) to help lower blood pressure. Education took 30 minutes. Booklet (Blood pressure: How Do you Measure up?) and blood pressure cuff given to patient. His goal it to try to eat more whole grains: maybe a whole wheat pita with his tuna salad. Follow up was arranged.  Norm Parcelonna Liticia Gasior, RD 03/13/2018 10:43 AM.

## 2018-03-13 NOTE — Patient Instructions (Signed)
Mr. Samuel KusterSellers,  [name] are doing a great job making changes in your smoking, activity and meal plan that should help your blood pressure and weight.   Please make a follow up for 4 weeks.   You or your support person are welcome to call me anytime with questions or concerns.   [name] [phone]

## 2018-03-14 MED ORDER — AMLODIPINE BESYLATE 10 MG PO TABS: 10.0000 mg | ORAL_TABLET | Freq: Every day | ORAL | 1 refills | Status: DC

## 2018-03-24 ENCOUNTER — Other Ambulatory Visit: Payer: Self-pay | Admitting: Internal Medicine

## 2018-03-24 MED ORDER — SERTRALINE HCL 25 MG PO TABS: 25.0000 mg | ORAL_TABLET | Freq: Every day | ORAL | 0 refills | Status: DC

## 2018-03-24 NOTE — Telephone Encounter (Signed)
Patient called in states he's been out of sertraline for 3 days. Thought his pharmacy had sent request. States his anxiety is very high 2/2 being out of med. Kinnie FeilL. Emerson Schreifels, RN, BSN

## 2018-03-24 NOTE — Telephone Encounter (Signed)
Refill Request  sertraline (ZOLOFT) 25 MG tablet(Expired) [id number] - [place], [place] - [address]

## 2018-03-28 ENCOUNTER — Other Ambulatory Visit (HOSPITAL_COMMUNITY)
Admission: RE | Admit: 2018-03-28 | Discharge: 2018-03-28 | Disposition: A | Discharge status: Home / Self Care | Payer: Medicare HMO | Source: Ambulatory Visit | Attending: Internal Medicine | Admitting: Internal Medicine

## 2018-03-28 ENCOUNTER — Ambulatory Visit (INDEPENDENT_AMBULATORY_CARE_PROVIDER_SITE_OTHER): Payer: Medicare HMO | Admitting: Internal Medicine

## 2018-03-28 ENCOUNTER — Encounter: Payer: Self-pay | Admitting: Internal Medicine

## 2018-03-28 VITALS — BP 175/90 | HR 90 | Temp 98.5°F | Wt 297.6 lb

## 2018-03-28 DIAGNOSIS — Z0000 Encounter for general adult medical examination without abnormal findings: Secondary | ICD-10-CM

## 2018-03-28 DIAGNOSIS — Z7251 High risk heterosexual behavior: Secondary | ICD-10-CM | POA: Diagnosis present

## 2018-03-28 DIAGNOSIS — Z23 Encounter for immunization: Secondary | ICD-10-CM

## 2018-03-28 DIAGNOSIS — H5789 Other specified disorders of eye and adnexa: Secondary | ICD-10-CM

## 2018-03-28 DIAGNOSIS — R7303 Prediabetes: Secondary | ICD-10-CM | POA: Diagnosis not present

## 2018-03-28 DIAGNOSIS — Z87898 Personal history of other specified conditions: Secondary | ICD-10-CM

## 2018-03-28 DIAGNOSIS — F419 Anxiety disorder, unspecified: Secondary | ICD-10-CM | POA: Diagnosis not present

## 2018-03-28 DIAGNOSIS — Z6841 Body Mass Index (BMI) 40.0 and over, adult: Secondary | ICD-10-CM | POA: Diagnosis not present

## 2018-03-28 DIAGNOSIS — Z79899 Other long term (current) drug therapy: Secondary | ICD-10-CM

## 2018-03-28 DIAGNOSIS — E785 Hyperlipidemia, unspecified: Secondary | ICD-10-CM

## 2018-03-28 DIAGNOSIS — Z823 Family history of stroke: Secondary | ICD-10-CM | POA: Diagnosis not present

## 2018-03-28 DIAGNOSIS — Z818 Family history of other mental and behavioral disorders: Secondary | ICD-10-CM | POA: Diagnosis not present

## 2018-03-28 DIAGNOSIS — J309 Allergic rhinitis, unspecified: Secondary | ICD-10-CM

## 2018-03-28 DIAGNOSIS — E041 Nontoxic single thyroid nodule: Secondary | ICD-10-CM

## 2018-03-28 DIAGNOSIS — I10 Essential (primary) hypertension: Secondary | ICD-10-CM

## 2018-03-28 DIAGNOSIS — Z809 Family history of malignant neoplasm, unspecified: Secondary | ICD-10-CM | POA: Diagnosis not present

## 2018-03-28 DIAGNOSIS — R69 Illness, unspecified: Secondary | ICD-10-CM | POA: Diagnosis not present

## 2018-03-28 DIAGNOSIS — F4321 Adjustment disorder with depressed mood: Secondary | ICD-10-CM | POA: Diagnosis not present

## 2018-03-28 MED ORDER — LOSARTAN POTASSIUM-HCTZ 100-25 MG PO TABS: 1.0000 | ORAL_TABLET | Freq: Every day | ORAL | 0 refills | Status: DC

## 2018-03-28 NOTE — Patient Instructions (Signed)
It was a pleasure to see you today Ms. Sellars. Please make the following changes: Your blood pressure continues to be elevated. I have increased your losartan-hctz to 100-25mg  daily. Please continue amlodipine. Please get your renal doppler completed and also your sleep study.  If you have any questions or concerns, please call our clinic at [phone] between 9am-5pm and after hours call [phone] and ask for the internal medicine resident on call. If you feel you are having a medical emergency please call 911.   Thank you, we look forward to help you remain healthy!  Lorenso Courier, MD Internal Medicine PGY2

## 2018-03-28 NOTE — Progress Notes (Signed)
   CC: Hypertension Follow up  HPI:  [name] is a 37 y.o. male with hypertension, anxiety disorder,prediabetes, and hyperlipidemia who is presenting for hypertension follow up. Please see problem based charting for evaluation, assessment, and plan.  Past Medical History:  Diagnosis Date  . Asthma    Childhood, not active  . Gout    Most recent flare 01/2018  . Hypertension   . Keratoconus of both eyes   . Legally blind    Review of Systems:    Review of Systems  Constitutional: Negative for chills.  Eyes: Positive for redness (bilateral).  Cardiovascular: Negative for chest pain.  Gastrointestinal: Negative for abdominal pain, nausea and vomiting.  Neurological: Negative for dizziness.   Physical Exam:  Vitals:   03/28/18 1526  BP: (!) 153/89  Pulse: (!) 105  Temp: 98.5 F (36.9 C)  TempSrc: Oral  SpO2: 98%  Weight: 297 lb 9.6 oz (135 kg)   Physical Exam  Constitutional: He appears well-developed and well-nourished. No distress.  HENT:  Head: Normocephalic and atraumatic.  Eyes: Conjunctivae are normal.  Cardiovascular: Normal rate, regular rhythm and normal heart sounds.  Respiratory: Effort normal and breath sounds normal. No respiratory distress. He has no wheezes.  GI: Soft. Bowel sounds are normal. He exhibits no distension. There is no tenderness.  Musculoskeletal: He exhibits no edema.  Neurological: He is alert.  Skin: He is not diaphoretic. No erythema.  Psychiatric: He has a normal mood and affect. His behavior is normal. Judgment and thought content normal.    Assessment & Plan:   See Encounters Tab for problem based charting.  Patient discussed with Dr. Heide Spark

## 2018-03-29 ENCOUNTER — Encounter: Payer: Self-pay | Admitting: Internal Medicine

## 2018-03-29 DIAGNOSIS — J309 Allergic rhinitis, unspecified: Secondary | ICD-10-CM | POA: Insufficient documentation

## 2018-03-29 DIAGNOSIS — Z7251 High risk heterosexual behavior: Secondary | ICD-10-CM | POA: Insufficient documentation

## 2018-03-29 DIAGNOSIS — Z0000 Encounter for general adult medical examination without abnormal findings: Secondary | ICD-10-CM | POA: Insufficient documentation

## 2018-03-29 LAB — BMP8+ANION GAP
Anion Gap: 16 mmol/L (ref 10.0–18.0)
BUN/Creatinine Ratio: 9 (ref 9–20)
BUN: 9 mg/dL (ref 6–20)
CO2: 22 mmol/L (ref 20–29)
Calcium: 9.3 mg/dL (ref 8.7–10.2)
Chloride: 103 mmol/L (ref 96–106)
Creatinine, Ser: 1.04 mg/dL (ref 0.76–1.27)
GFR calc Af Amer: 107 mL/min/{1.73_m2} (ref >59)
GFR calc non Af Amer: 93 mL/min/{1.73_m2} (ref >59)
Glucose: 91 mg/dL (ref 65–99)
Potassium: 4.3 mmol/L (ref 3.5–5.2)
Sodium: 141 mmol/L (ref 134–144)

## 2018-03-29 MED ORDER — FLUTICASONE PROPIONATE 50 MCG/ACT NA SUSP: 1.0000 | Freq: Every day | NASAL | 0 refills | Status: DC

## 2018-03-29 MED ORDER — LORATADINE 10 MG PO CAPS: 10.0000 mg | ORAL_CAPSULE | Freq: Every day | ORAL | 1 refills | Status: DC

## 2018-03-29 NOTE — Assessment & Plan Note (Signed)
The patient has been sexually active with 4 partners over the past year. He states that he only uses condoms with two of his partners.  He does not have any swelling, discharge, or irritation. The patient had hiv test which was negative on 02/12/18.  Assessment and plan  GC chlamydia test ordered. Counseled the patient on consistent condom use.

## 2018-03-29 NOTE — Assessment & Plan Note (Signed)
The patient has been having runny eyes, runny nose, and productive cough. He does not have any sob or chest pain. He has been using over the counter allergy medication without relief.  Assessment and plan  The patient has allergic rhinitis for which I prescribed loratadine 10mg  qd and flonase to use in his nares.

## 2018-03-29 NOTE — Assessment & Plan Note (Signed)
The patient's blood pressure during this visit was 153/89 and visit 157/91. The patient is currently taking amlodipine 10mg  qd, losartan-hctz 50-12.77m qd. His last blood pressure visits are  BP Readings from Last 3 Encounters:  03/28/18 (!) 175/90  03/13/18 (!) 159/92  02/17/18 (!) 187/113  The patient does/does not report palpitations, dizziness, chest pain, sob.  Patient has lost 8lbs in the past month  Assessment and plan The patient's blood pressure is not controlled on current medication of losartan-hctz 50-12.5mg  qd and amlodipine 10mg  qd. The patient is compliant with the medication.   Will increase patient's losartan-hctz to 100-25mg . Will also get bmp to check renal function. Advised patient to continue lifestyle modifications of salt reduced diet and exercise.  Ordered renal doppler to check for RAS. Also ordered sleep study to evaluate for osa as secondary cause of htn.

## 2018-03-29 NOTE — Assessment & Plan Note (Signed)
The patient has a phq9 score of 7 and gad7 of 7 during this visit. He mentioned that he feels a difference after starting sertraline 25mg  qd. He describes himself as "not being so anxious but not having a high feeling" as well. He mentioned that his friends and family have noticed that he is calmer.   The patient plans on going to a psychologist for counseling.   Assessment and plan  The patient was encouraged to continue sertraline 25mg  qd. I offered him virtual behavioral health visits, but he stated that he wanted to have live visits and he would arrange for a psychologist's appointment at the office where his friend's mother works.

## 2018-03-29 NOTE — Assessment & Plan Note (Signed)
Influenza and tdap vaccination given

## 2018-03-31 LAB — URINE CYTOLOGY ANCILLARY ONLY
Chlamydia: NEGATIVE
Neisseria Gonorrhea: NEGATIVE

## 2018-03-31 NOTE — Progress Notes (Signed)
Internal Medicine Clinic Attending  Case discussed with Dr. Chundi at the time of the visit.  We reviewed the resident's history and exam and pertinent patient test results.  I agree with the assessment, diagnosis, and plan of care documented in the resident's note. 

## 2018-04-04 ENCOUNTER — Ambulatory Visit (HOSPITAL_COMMUNITY): Admission: RE | Admit: 2018-04-04 | Payer: Medicare HMO | Source: Ambulatory Visit

## 2018-04-11 ENCOUNTER — Other Ambulatory Visit: Payer: Self-pay | Admitting: Internal Medicine

## 2018-04-22 ENCOUNTER — Other Ambulatory Visit: Payer: Self-pay | Admitting: Internal Medicine

## 2018-04-30 ENCOUNTER — Ambulatory Visit (HOSPITAL_COMMUNITY)
Admission: RE | Admit: 2018-04-30 | Discharge: 2018-04-30 | Disposition: A | Discharge status: Home / Self Care | Payer: Medicare HMO | Source: Ambulatory Visit | Attending: Internal Medicine | Admitting: Internal Medicine

## 2018-04-30 DIAGNOSIS — I10 Essential (primary) hypertension: Secondary | ICD-10-CM | POA: Insufficient documentation

## 2018-04-30 DIAGNOSIS — E669 Obesity, unspecified: Secondary | ICD-10-CM | POA: Insufficient documentation

## 2018-04-30 NOTE — Progress Notes (Signed)
Renal artery duplex prelim:   Renal: technically limited study due to body habitus and bowel gas. Right: No evidence of right renal artery stenosis, in segments visualized. Normal right Resistive Index.  Left:No evidence of left renal artery stenosis. Normal left Resistive Index.   Farrel Demark, RDMS, RVT

## 2018-05-03 ENCOUNTER — Other Ambulatory Visit: Payer: Self-pay | Admitting: Internal Medicine

## 2018-05-10 ENCOUNTER — Other Ambulatory Visit: Payer: Self-pay | Admitting: Internal Medicine

## 2018-05-13 ENCOUNTER — Other Ambulatory Visit: Payer: Self-pay | Admitting: Internal Medicine

## 2018-05-23 ENCOUNTER — Other Ambulatory Visit: Payer: Self-pay | Admitting: Internal Medicine

## 2018-06-08 ENCOUNTER — Other Ambulatory Visit: Payer: Self-pay | Admitting: Internal Medicine

## 2018-06-22 ENCOUNTER — Other Ambulatory Visit: Payer: Self-pay | Admitting: Internal Medicine

## 2018-08-23 ENCOUNTER — Other Ambulatory Visit: Payer: Self-pay | Admitting: Internal Medicine

## 2018-09-19 ENCOUNTER — Encounter: Payer: Medicare HMO | Admitting: Internal Medicine

## 2018-10-24 ENCOUNTER — Encounter: Payer: Medicare HMO | Admitting: Internal Medicine

## 2018-11-24 NOTE — Progress Notes (Deleted)
   CC: ***  HPI:  Mr.Micajah D Opdyke is a 38 y.o. with hypertension, prediabetes, gout, thyroid nodule, and cervical radiculopathy who presents for hypertension follow up. Please see problem based charting for evaluation, assessment, and plan.  htn The patient's blood pressure during this visit was ***. The patient is currently taking losartan-hctz 100-25mg  qd and amlodipine 10mg  qd. His last blood pressure visits are  BP Readings from Last 3 Encounters:  03/28/18 (!) 175/90  03/13/18 (!) 159/92  02/17/18 (!) 187/113    The patient does/does not *** report palpitations, dizziness, chest pain, sob ***.  Assessment and Plan ***  Pre-diabetes  Patient's last a1c is 5.5. The patient is currently not on any pharmaceutical agents. I recommended him to continue work on lifestyle modifications.   Thyroid nodule  An incidental solid nodule was found in  left lower love of thyroid in July 2015. The patient's last tsh was 0.598 in August 2019. It does not appear that the patient had a thyroid ultrasound done for this following.   Assessment and plan   -Free t3 and t4, tsh ordered -Thyroid ultrasound     Past Medical History:  Diagnosis Date  . Asthma    Childhood, not active  . CHEST PAIN UNSPECIFIED 05/03/2010   Qualifier: Diagnosis of  By: Huntley Dec, Scott    . ELECTROCARDIOGRAM, ABNORMAL 05/03/2010   Qualifier: Diagnosis of  By: Huntley Dec, Scott    . Gout    Most recent flare 01/2018  . HYPERLIPIDEMIA 05/03/2010   Qualifier: Diagnosis of  By: Huntley Dec, Scott    . Hypertension   . Keratoconus of both eyes   . Legally blind    Review of Systems:  ***  Physical Exam:  There were no vitals filed for this visit. ***  Assessment & Plan:   See Encounters Tab for problem based charting.  Patient {GC/GE:3044014::"discussed with","seen with"} Dr. {NAMES:[id number]::"Butcher","Granfortuna","E. Hoffman","Klima","Mullen","Narendra","Raines","Vincent"}

## 2018-11-25 ENCOUNTER — Ambulatory Visit: Payer: Medicare HMO | Admitting: Internal Medicine

## 2018-11-25 ENCOUNTER — Other Ambulatory Visit: Payer: Self-pay

## 2018-11-25 DIAGNOSIS — I10 Essential (primary) hypertension: Secondary | ICD-10-CM

## 2018-11-25 NOTE — Progress Notes (Signed)
   Was unable to reach patient for a telehealth visit. Left voicemail after attempting to reach x2.  Lorenso Courier, MD Internal Medicine PGY2 [phone] 11/25/2018, 2:45 PM

## 2018-11-26 ENCOUNTER — Encounter: Payer: Medicare HMO | Admitting: Internal Medicine

## 2018-11-28 ENCOUNTER — Encounter: Payer: Medicare HMO | Admitting: Internal Medicine

## 2019-01-14 ENCOUNTER — Telehealth: Payer: Self-pay

## 2019-01-14 NOTE — Telephone Encounter (Signed)
Requesting to speak with a nurse about meds. Please call back.  

## 2019-01-14 NOTE — Telephone Encounter (Signed)
Returned call to patient. States he has white pimples on thighs and bottom of scrotum. He is wondering if it could be and STD. Has one male partner and they do not use condoms. [facility] appt scheduled for tomorrow at [facility]. Hubbard Hartshorn, RN, BSN

## 2019-01-15 ENCOUNTER — Other Ambulatory Visit (HOSPITAL_COMMUNITY)
Admission: RE | Admit: 2019-01-15 | Discharge: 2019-01-15 | Disposition: A | Discharge status: Home / Self Care | Payer: Medicare HMO | Source: Ambulatory Visit | Attending: Student in an Organized Health Care Education/Training Program | Admitting: Student in an Organized Health Care Education/Training Program

## 2019-01-15 ENCOUNTER — Encounter: Payer: Self-pay | Admitting: Internal Medicine

## 2019-01-15 ENCOUNTER — Ambulatory Visit (INDEPENDENT_AMBULATORY_CARE_PROVIDER_SITE_OTHER): Payer: Medicare HMO | Admitting: Internal Medicine

## 2019-01-15 ENCOUNTER — Other Ambulatory Visit: Payer: Self-pay

## 2019-01-15 VITALS — BP 167/105 | HR 80 | Temp 98.0°F | Wt 288.6 lb

## 2019-01-15 DIAGNOSIS — Z7251 High risk heterosexual behavior: Secondary | ICD-10-CM

## 2019-01-15 DIAGNOSIS — I10 Essential (primary) hypertension: Secondary | ICD-10-CM

## 2019-01-15 DIAGNOSIS — R21 Rash and other nonspecific skin eruption: Secondary | ICD-10-CM | POA: Insufficient documentation

## 2019-01-15 DIAGNOSIS — Z7252 High risk homosexual behavior: Secondary | ICD-10-CM

## 2019-01-15 DIAGNOSIS — Z87898 Personal history of other specified conditions: Secondary | ICD-10-CM | POA: Diagnosis not present

## 2019-01-15 DIAGNOSIS — L739 Follicular disorder, unspecified: Secondary | ICD-10-CM | POA: Diagnosis not present

## 2019-01-15 DIAGNOSIS — R69 Illness, unspecified: Secondary | ICD-10-CM | POA: Diagnosis not present

## 2019-01-15 DIAGNOSIS — Z9114 Patient's other noncompliance with medication regimen: Secondary | ICD-10-CM | POA: Diagnosis not present

## 2019-01-15 DIAGNOSIS — Z79899 Other long term (current) drug therapy: Secondary | ICD-10-CM | POA: Diagnosis not present

## 2019-01-15 LAB — POCT GLYCOSYLATED HEMOGLOBIN (HGB A1C): Hemoglobin A1C: 5.4 % (ref 4.0–5.6)

## 2019-01-15 LAB — GLUCOSE, CAPILLARY: Glucose-Capillary: 91 mg/dL (ref 70–99)

## 2019-01-15 NOTE — Patient Instructions (Addendum)
Thank you for allowing [place] to provide your care today. Today we discussed the rash on your thighs, diabetes screening, and your hypertension.     Today we, ordered the following labs:  RPR, chlamydia, and gonorrhea  Please follow-up in two weeks for blood pressure check and to discuss started PREP treatment if you are interested.   Should you have any questions or concerns please call the internal medicine clinic at [phone].

## 2019-01-15 NOTE — Assessment & Plan Note (Signed)
On PE there are no bumps currently present on his inner thighs. Penile and scrotal exam without acute findings. He did have a photo which showed small follicular bumps. This is likely secondary to chafing and excessive sweating and irritation and does not appear typical to any STD or infection. He is due for STD testing and would like to go ahead with this.   - HIV, Chlamydia, Gonorrhea, and RPR ordered - advised to keep area dry and clean, use gentle soaps without fragrance

## 2019-01-15 NOTE — Assessment & Plan Note (Signed)
Blood pressure elevated to 177/110 today. Repeat 167/105. He is supposed to be taking amlodipine 10 mg qd and Hyzaar 100-25 mg qd but stopped his medications after he lost some weight. He refilled them three days ago and is now back on them.   - continue Hyzaar and amlodipine - f/u two weeks for blood pressure check - discussed remaining on blood pressure medications. If he is able to come off them at some point we would d/c one at a time.

## 2019-01-15 NOTE — Assessment & Plan Note (Signed)
Sexually active with one male partner currently although they are not monogamous. He does not use protection and practice both receptive and insertive sex. He would like to continue routine screening for STDs. We also discussed starting PREP. Discussed how this works and why it would be beneficial. Also provided some printed information. He is very interested and will look into starting PREP at follow-up.   - discuss starting prep at f/u in two weeks - HIV - RPR - urine Gonorrhea/Chlamydia test

## 2019-01-15 NOTE — Assessment & Plan Note (Signed)
He would like to be tested for diabetes as he has a history of prediabes  - hemoglobin a1c - a1c normal, discussed with patient

## 2019-01-15 NOTE — Telephone Encounter (Signed)
Thank you for scheduling the appt Lauren! Please make sure we get HIV test along with STD. We should also speak about PrEP if patient has been having repeated STDs.

## 2019-01-15 NOTE — Progress Notes (Signed)
   CC: bumps on thighs   HPI:  [name] is a 38 y.o. with PMH as below presenting with three weeks of bumps on his inner thighs that look like small pimples. There is no associated pain or pruritis and he has never seen anything like this before. He is concerned because he and his male partner are not always monogamous and do not use protection. He has not had sex with anyone else since being tested last September but is unsure if his partner has. He has not noted any lesions or bumps on his penis, scrutum or anus. He has no penile discharge, dysuria, fever, chills, malaise, or other noted rash.   Please see A&P for assessment of the patient's acute and chronic medical conditions.   Past Medical History:  Diagnosis Date  . Asthma    Childhood, not active  . CHEST PAIN UNSPECIFIED 05/03/2010   Qualifier: Diagnosis of  By: Jorene Minors, Scott    . ELECTROCARDIOGRAM, ABNORMAL 05/03/2010   Qualifier: Diagnosis of  By: Jorene Minors, Scott    . Gout    Most recent flare 01/2018  . HYPERLIPIDEMIA 05/03/2010   Qualifier: Diagnosis of  By: Jorene Minors, Scott    . Hypertension   . Keratoconus of both eyes   . Legally blind    Review of Systems:   ROS negative except as noted in HPI.   Physical Exam:  Constitution: NAD, obese Cardio: rrr, no m/r/g, nml s1 & s2, no LE edema  Respiratory: CTA, no w/r/r  GU: penile and scrotal exam wnl. No rash or lesion present Neuro: alert & oriented, normal affect Skin: no rash or lesion of inner thigh. Photo from phone shows inner thigh with follicular rash with small bumps   Vitals:   01/15/19 0904  BP: (!) 177/110  Pulse: 97  Temp: 98 F (36.7 C)  TempSrc: Oral  SpO2: 100%  Weight: 288 lb 9.6 oz (130.9 kg)    Assessment & Plan:   See Encounters Tab for problem based charting.  Patient discussed with Dr. Evette Doffing

## 2019-01-16 NOTE — Progress Notes (Signed)
Internal Medicine Clinic Attending  Case discussed with Dr. Seawell at the time of the visit.  We reviewed the resident's history and exam and pertinent patient test results.  I agree with the assessment, diagnosis, and plan of care documented in the resident's note.    

## 2019-01-20 ENCOUNTER — Telehealth: Payer: Self-pay | Admitting: *Deleted

## 2019-01-20 ENCOUNTER — Other Ambulatory Visit: Payer: Self-pay | Admitting: Internal Medicine

## 2019-01-20 DIAGNOSIS — A539 Syphilis, unspecified: Secondary | ICD-10-CM

## 2019-01-20 LAB — URINE CYTOLOGY ANCILLARY ONLY
Chlamydia: NEGATIVE
Neisseria Gonorrhea: NEGATIVE

## 2019-01-20 MED ORDER — PENICILLIN G BENZATHINE 2400000 UNIT/4ML IM SUSP: 2.4000 10*6.[IU] | Freq: Once | INTRAMUSCULAR | Status: DC

## 2019-01-20 NOTE — Telephone Encounter (Signed)
[facility] calls to confirm RPR results and demographic info, spoke to dr seawell. To inform her

## 2019-01-21 ENCOUNTER — Other Ambulatory Visit: Payer: Self-pay

## 2019-01-21 ENCOUNTER — Ambulatory Visit (INDEPENDENT_AMBULATORY_CARE_PROVIDER_SITE_OTHER): Payer: Medicare HMO | Admitting: *Deleted

## 2019-01-21 DIAGNOSIS — A539 Syphilis, unspecified: Secondary | ICD-10-CM | POA: Diagnosis not present

## 2019-01-21 DIAGNOSIS — R69 Illness, unspecified: Secondary | ICD-10-CM | POA: Diagnosis not present

## 2019-01-21 MED ORDER — PENICILLIN G BENZATHINE 1200000 UNIT/2ML IM SUSP
1.2000 10*6.[IU] | Freq: Once | INTRAMUSCULAR | Status: AC
  Administered 2019-01-21: 1.2 10*6.[IU] via INTRAMUSCULAR

## 2019-01-21 MED ORDER — PENICILLIN G BENZATHINE 1200000 UNIT/2ML IM SUSP
1.2000 10*6.[IU] | Freq: Once | INTRAMUSCULAR | Status: AC
  Administered 2019-01-21: 10:00:00 1.2 10*6.[IU] via INTRAMUSCULAR

## 2019-01-22 LAB — RPR, QUANT+TP ABS (REFLEX)
Rapid Plasma Reagin, Quant: 1:128 {titer} — ABNORMAL HIGH
T Pallidum Abs: REACTIVE — AB

## 2019-01-22 LAB — RPR: RPR Ser Ql: REACTIVE — AB

## 2019-01-22 LAB — HIV ANTIBODY (ROUTINE TESTING W REFLEX): HIV Screen 4th Generation wRfx: NONREACTIVE

## 2019-02-02 NOTE — Progress Notes (Signed)
   CC: STD follow up  HPI:  [name] is a 38 y.o. with hypertension, thyroid nodule, anxiety disorder, gout who presents for htn follow up. Please see problem based charting for evaluation, assessment, and plan.   Past Medical History:  Diagnosis Date  . Asthma    Childhood, not active  . CHEST PAIN UNSPECIFIED 05/03/2010   Qualifier: Diagnosis of  By: Jorene Minors, Scott    . ELECTROCARDIOGRAM, ABNORMAL 05/03/2010   Qualifier: Diagnosis of  By: Jorene Minors, Scott    . Gout    Most recent flare 01/2018  . HYPERLIPIDEMIA 05/03/2010   Qualifier: Diagnosis of  By: Jorene Minors, Scott    . Hypertension   . Keratoconus of both eyes   . Legally blind    Review of Systems:    With runny eyes, itchy eyes, occasional cough, lower back pain Denies chest pain, dyspnea, abdominal pain  Physical Exam:  Vitals:   02/03/19 1559  BP: (!) 186/116  Pulse: (!) 110  Temp: 98.6 F (37 C)  TempSrc: Oral  SpO2: 100%  Weight: 296 lb 1.6 oz (134.3 kg)   Physical Exam  Constitutional: Appears well-developed and well-nourished. No distress.  HENT:  Head: Normocephalic and atraumatic.  Eyes: Conjunctivae are injected. Cardiovascular: Normal rate, regular rhythm and normal heart sounds.  Respiratory: Effort normal and breath sounds normal. No respiratory distress. No wheezes.  GI: Soft. Bowel sounds are normal. No distension. There is no tenderness.  Musculoskeletal: No edema.  Neurological: Is alert.  Skin: Not diaphoretic. No erythema.  Psychiatric: Normal mood and affect. Behavior is normal. Judgment and thought content normal.    Assessment & Plan:   See Encounters Tab for problem based charting.  Patient discussed with Dr. Lynnae January

## 2019-02-03 ENCOUNTER — Ambulatory Visit (INDEPENDENT_AMBULATORY_CARE_PROVIDER_SITE_OTHER): Payer: Medicare HMO | Admitting: Internal Medicine

## 2019-02-03 ENCOUNTER — Other Ambulatory Visit: Payer: Self-pay

## 2019-02-03 VITALS — BP 186/116 | HR 110 | Temp 98.6°F | Wt 296.1 lb

## 2019-02-03 DIAGNOSIS — M109 Gout, unspecified: Secondary | ICD-10-CM

## 2019-02-03 DIAGNOSIS — I10 Essential (primary) hypertension: Secondary | ICD-10-CM | POA: Diagnosis not present

## 2019-02-03 DIAGNOSIS — H1013 Acute atopic conjunctivitis, bilateral: Secondary | ICD-10-CM | POA: Diagnosis not present

## 2019-02-03 DIAGNOSIS — Z87438 Personal history of other diseases of male genital organs: Secondary | ICD-10-CM

## 2019-02-03 DIAGNOSIS — M545 Low back pain: Secondary | ICD-10-CM

## 2019-02-03 DIAGNOSIS — Z79899 Other long term (current) drug therapy: Secondary | ICD-10-CM

## 2019-02-03 DIAGNOSIS — E041 Nontoxic single thyroid nodule: Secondary | ICD-10-CM

## 2019-02-03 DIAGNOSIS — H1010 Acute atopic conjunctivitis, unspecified eye: Secondary | ICD-10-CM | POA: Insufficient documentation

## 2019-02-03 DIAGNOSIS — Z7252 High risk homosexual behavior: Secondary | ICD-10-CM

## 2019-02-03 DIAGNOSIS — F419 Anxiety disorder, unspecified: Secondary | ICD-10-CM

## 2019-02-03 DIAGNOSIS — F17210 Nicotine dependence, cigarettes, uncomplicated: Secondary | ICD-10-CM

## 2019-02-03 DIAGNOSIS — R69 Illness, unspecified: Secondary | ICD-10-CM | POA: Diagnosis not present

## 2019-02-03 MED ORDER — CARVEDILOL 3.125 MG PO TABS: 3.1250 mg | ORAL_TABLET | Freq: Two times a day (BID) | ORAL | 0 refills | Status: DC

## 2019-02-03 MED ORDER — OLOPATADINE HCL 0.1 % OP SOLN: 1.0000 [drp] | Freq: Two times a day (BID) | OPHTHALMIC | 1 refills | Status: AC

## 2019-02-03 NOTE — Assessment & Plan Note (Signed)
Patient is sexually active with males only. He has had 2 male partners over the past year with whom he has had receptive and insertive sexual intercourse without consistent use of condoms. He has been treated for syphillis with penicillin G. He tested negative for gonorrhea and chlamydia.   Due to patient's high risk sexual behavior he fits criteria for PrEP. After extensive discussion with patient regarding the benefits and side effects of PrEP he was agreeable to starting Truvada. He stated that he preferred getting PrEP with his PCP.   Assessment and plan  I have ordered screening labs for patient prior to starting Truvada.   -UA -BMP to check gfr -Hep B surface ag, surface ab, core ab -Hep C antibody -HIV negative per test July 2020, does not need quantitative

## 2019-02-03 NOTE — Assessment & Plan Note (Addendum)
The patient's blood pressure during this visit was 186/116 pulse 110, repeat blood pressure reading was unfortunately not collected. The patient is being prescribed  amlodipine 10mg  qd and losartan-hctz 100-25mg  qd. He stopped taking his medication approximately 6 weeks ago after he noticed that his blood pressure had decreased. He has been actively trying to lose weight and has lost 12 lbs over the past year. He resumed taking his medication 3 weeks ago   BP Readings from Last 3 Encounters:  02/03/19 (!) 186/116  01/15/19 (!) 167/105  03/28/18 (!) 175/90   The patient does not report palpitations, dizziness, chest pain, sob.  Assessment and Plan The patient's blood pressure remains uncontrolled after being adherent with antihypertensive medication for the past 3 weeks and attempting to lose weight which is concerning. There is concern for possible other secondary causes of hypertension which include osa, tobacco use disorder, primary hyperaldosteronism, cushing syndrome or pheochromocytoma. The patient does not exibhit signs of pheochromocytoma. His potassium of 3.7 and creatinine of 1.23 from cmp august 2019 is less concerning for hyperaldosteronism contributing to his elevated blood pressure. TSH within normal range -0.598 (aug 2019). Patient continues to smoke cigarettes.   -started carvedilol 3.125mg  bid -BMP -UA -TSH -Encouraged smoking cessation

## 2019-02-03 NOTE — Assessment & Plan Note (Signed)
Patient with bilateral conjunctival injection. He states that his eyes are like this every year around this time. He has had accompanied runny eyes and itching in bilateral eyes. Patient likely has allergic conjunctivitis.   -Prescribed olopatadine eye drops to use one drop bid

## 2019-02-03 NOTE — Patient Instructions (Addendum)
It was a pleasure to see you today [name].  During this visit your blood pressure was very high. We would like to start you on another medication-carvedilol 3.125mg  bid along with your existing amlodipine and losartan-hctz medications. Please follow up in 2 weeks.   You have already been treated for syphillis. We will recheck those labs in 6 months.   Please make sure you follow up with an ophthalmologist regarding your cornea surgery. I have also prescribed you olopatadine eye drops for your red eyes.  I have collected blood and urine from you to see if you are eligible to be started on PrEP. I will call you with your results and then call in the medication Truvada if you are eligible. I have provided information below regarding this medication.   If you have any questions or concerns, please call our clinic at [phone] between 9am-5pm and after hours call [phone] and ask for the internal medicine resident on call. If you feel you are having a medical emergency please call 911.   Thank you, we look forward to help you remain healthy!  Lorenso CourierVahini Clell Trahan, MD Internal Medicine PGY3  Emtricitabine; Tenofovir disoproxil fumarate tablets What is this medicine? EMTRICITABINE; TENOFOVIR DISOPROXIL FUMARATE (em tri SIT uh bean; te NOE fo veer) is 2 antiretroviral medicines in 1 tablet. It is used with other medicines to treat HIV. This medicine is not a cure for HIV. This medicine can lower, but not fully prevent, the risk of spreading HIV to others. This medicine can also be used with safe sex practices to prevent HIV infection in high-risk persons. This medicine may be used for other purposes; ask your health care provider or pharmacist if you have questions. COMMON BRAND NAME(S): Truvada What should I tell my health care provider before I take this medicine? They need to know if you have any of these conditions:  bone problems  kidney disease  liver disease  an unusual or allergic  reaction to emtricitabine, tenofovir, other medicines, foods, dyes, or preservatives  pregnant or trying to get pregnant  breast-feeding How should I use this medicine? Take this medicine by mouth with a glass of water. Swallow tablets whole; do not cut, crush or chew. Follow the directions on the prescription label. You can take it with or without food. If it upsets your stomach, take it with food. Take your medicine at regular intervals. Do not take your medicine more often than directed. For your anti-HIV therapy to work as well as possible, take each dose exactly as prescribed. Do not skip doses or stop your medicine even if you feel better. Skipping doses may make the HIV virus resistant to this medicine and other medicines. Do not stop taking except on your doctor's advice. Talk to your pediatrician regarding the use of this medicine in children. While this drug may be prescribed for selected conditions, precautions do apply. Overdosage: If you think you have taken too much of this medicine contact a poison control center or emergency room at once. NOTE: This medicine is only for you. Do not share this medicine with others. What if I miss a dose? If you miss a dose, take it as soon as you can. If it is almost time for your next dose, take only that dose. Do not take double or extra doses. What may interact with this medicine? Do not take this medicine with any of the following medications:  adefovir  any medicine that contains lamivudine  any medicine that  contains emtricitabine or tenofovir This medicine may also interact with the following medications:  atazanavir  didanosine, ddI  lopinavir; ritonavir  medicines for viral infections like cidofovir, acyclovir, valacyclovir, ganciclovir, valganciclovir  saquinavir This list may not describe all possible interactions. Give your health care provider a list of all the medicines, herbs, non-prescription drugs, or dietary supplements  you use. Also tell them if you smoke, drink alcohol, or use illegal drugs. Some items may interact with your medicine. What should I watch for while using this medicine? Visit your doctor or health care professional for regular check ups. Discuss any new symptoms with your doctor. You will need to have important blood work done while on this medicine. HIV is spread to others through sexual or blood contact. Talk to your doctor about how to stop the spread of HIV. If you have hepatitis B, talk to your doctor if you plan to stop this medicine. The symptoms of hepatitis B may get worse if you stop this medicine. What side effects may I notice from receiving this medicine? Side effects that you should report to your doctor or health care professional as soon as possible:  allergic reactions like skin rash, itching or hives, swelling of the face, lips, or tongue  bone pain  breathing difficulties  changes in vision  dizziness  fast, irregular heartbeat  muscle pain  nausea, vomiting, unusual upset stomach or stomach pain  signs and symptoms of kidney injury like trouble passing urine or change in the amount of urine  signs and symptoms of liver injury like dark yellow or brown urine; general ill feeling or flu-like symptoms; light-colored stools; loss of appetite; nausea; right upper belly pain; unusually weak or tired; yellowing of the eyes or skin  signs of infection - fever or chills, cough, sore throat, pain or trouble passing urine Side effects that usually do not require medical attention (report to your doctor or health care professional if they continue or are bothersome):  abnormal dreams  cough  depressed mood  diarrhea  headache  skin discoloration  trouble sleeping This list may not describe all possible side effects. Call your doctor for medical advice about side effects. You may report side effects to FDA at 1-800-FDA-1088. Where should I keep my medicine? Keep  out of the reach of children. Store at room temperature between 15 and 30 degrees C (59 and 86 degrees F). Throw away any unused medicine after the expiration date. NOTE: This sheet is a summary. It may not cover all possible information. If you have questions about this medicine, talk to your doctor, pharmacist, or health care provider.  2020 Elsevier/Gold Standard (2017-05-09 11:07:40)    Preventing Sexually Transmitted Infections, Adult Sexually transmitted infections (STIs) are diseases that are passed (transmitted) from person to person through bodily fluids exchanged during sex or sexual contact. Bodily fluids include saliva, semen, blood, vaginal mucus, and urine. You may have an increased risk for developing an STI if you have unprotected oral, vaginal, or anal sex. Some common STIs include:  Herpes.  Hepatitis B.  Chlamydia.  Gonorrhea.  Syphilis.  HPV (human papillomavirus).  HIV (human immunodeficiency virus), the virus that can cause AIDS (acquired immunodeficiency syndrome). How can I protect myself from sexually transmitted infections? The only way to completely prevent STIs is not to have sex of any kind (practice abstinence). This includes oral, vaginal, or anal sex. If you are sexually active, take these actions to lower your risk of getting an STI:  Have only one sex partner (be monogamous) or limit the number of sexual partners you have.  Stay up-to-date on immunizations. Certain vaccines can lower your risk of getting certain STIs, such as: ? Hepatitis A and B vaccines. You may have been vaccinated as a young child, but likely need a booster shot as a teen or young adult. ? HPV vaccine.  Use methods that prevent the exchange of body fluids between partners (barrier protection) every time you have sex. Barrier protection can be used during oral, vaginal, or anal sex. Commonly used barrier methods include: ? Male condom. ? Male condom. ? Dental dam.  Get  tested regularly for STIs. Have your sexual partner get tested regularly as well.  Avoid mixing alcohol, drugs, and sex. Alcohol and drug use can affect your ability to make good decisions and can lead to risky sexual behaviors.  Ask your health care provider about taking pre-exposure prophylaxis (PrEP) to prevent HIV infection if you: ? Have a HIV-positive sexual partner. ? Have multiple sexual partners or partners who do not know their HIV status, and do not regularly use a condom during sex. ? Use injection drugs and share needles. Birth control pills, injections, implants, and intrauterine devices (IUDs) do not protect against STIs. To prevent both STIs and pregnancy, always use a condom with another form of birth control. Some STIs, such as herpes, are spread through skin to skin contact. A condom does not protect you from getting such STIs. If you or your partner have herpes and there is an active flare with open sores, avoid all sexual contact. Why are these changes important? Taking steps to practice safe sex protects you and others. Many STIs can be cured. However, some STIs are not curable and will affect you for the rest of your life. STIs can be passed on to another person even if you do not have symptoms. What can happen if changes are not made? Certain STIs may:  Require you to take medicine for the rest of your life.  Affect your ability to have children (your fertility).  Increase your risk for developing another STI or certain serious health conditions, such as: ? Cervical cancer. ? Head and neck cancer. ? Pelvic inflammatory disease (PID) in women. ? Organ damage or damage to other parts of your body, if the infection spreads.  Be passed to a baby during childbirth. How are sexually transmitted infections treated? If you or your partner know or think that you may have an STI:  Talk with your health care provider about what can be done to treat it. Some STIs can be  treated and cured with medicines.  For curable STIs, you and your partner should avoid sex during treatment and for several days after treatment is complete.  You and your partner should both be treated at the same time, if there is any chance that your partner is infected as well. If you get treatment but your partner does not, your partner can re-infect you when you resume sexual contact.  Do not have unprotected sex. Where to find more information Learn more about sexually transmitted diseases and infections from:  Centers for Disease Control and Prevention: ? More information about specific STIs: SolutionApps.co.za ? Find places to get sexual health counseling and treatment for free or for a low cost: gettested.TonerPromos.no  U.S. Department of Health and Human Services: NotebookPreviews.si.html Summary  The only way to completely prevent STIs is not to have sex (practice abstinence), including oral, vaginal,  or anal sex.  STIs can spread through saliva, semen, blood, vaginal mucus, urine, or sexual contact.  If you do have sex, limit your number of sexual partners and use a barrier protection method every time you have sex.  If you develop an STI, get treated right away and ask your partner to be treated as well. Do not resume having sex until both of you have completed treatment for the STI. This information is not intended to replace advice given to you by your health care provider. Make sure you discuss any questions you have with your health care provider. Document Released: 06/14/2016 Document Revised: 11/22/2017 Document Reviewed: 06/14/2016 Elsevier Patient Education  2020 [place].    Preventing HIV Infection and AIDS HIV (human immunodeficiency virus) infection is a long-term (chronic) viral infection. HIV kills white blood cells that help to control the body's defense (immune) system and fight  infection. HIV spreads through semen, blood, breast milk, rectal fluid, and vaginal fluid. HIV is commonly spread through sexual contact and sharing needles or syringes, because these behaviors involve exchanging bodily fluids. Without treatment, HIV can turn into AIDS (acquired immunodeficiency syndrome), which is an advanced stage of HIV infection. AIDS is a very serious illness and can be life-threatening. What changes can I make to protect myself from HIV infection? Sexual contact To protect yourself from HIV through sexual contact:  Use devices that prevent body fluids from passing between partners (barrier protection) every time you have sex. Barrier protection can be used during oral, vaginal, or anal sex. Commonly used barrier methods include: ? Male condom. ? Male condom. ? Dental dam.  If you are at risk, ask your health care provider about taking medicine that can prevent HIV infection (pre-exposure prophylaxis, PrEP).  Get tested for HIV and know the HIV status of your sexual partner(s). Avoid having sex with partners without a known HIV status. If you or your partner is HIV-positive, use protection during sex.  Practice monogamy. This means you have only one sexual partner in your lifetime or only one partner at a time (serial monogamy).  Get tested and treated for STIs (sexually transmitted infections). Having an STI increases your risk for getting HIV. The only way to completely prevent HIV from being spread through sexual contact is not to have any kind of sex (abstinence), including oral, vaginal, or anal sex. Drug use To protect yourself from HIV through drug use:  Do not use drugs, especially drugs that are injected.  Avoid having sex while under the influence of alcohol and drugs. Alcohol and drugs can affect your ability to make good decisions and may lead you to engage in high risk behaviors.  Do not share needles or syringes with anyone else. If you do share needles  or syringes, consider taking PrEP to prevent HIV infection. Blood and bodily fluid To protect yourself from HIV through exposure to blood and bodily fluids from a person who has HIV:  Cover any sores or wounds on yourself or the person with HIV.  If you need to touch blood or bodily fluids from an infected person, use gloves and wash your hands afterward.  Do not share items that touch bodily fluids or blood, such as toothbrushes or razors. What can happen if I do not make these changes? If you do not make these changes:  You put yourself at risk of getting HIV from an infected person. HIV is a serious, life-threatening illness that cannot be cured. Having HIV makes it  easier to get sick and more difficult to get well.  You can pass HIV on to others even if you don't know that you have it. An infected mother can also pass it to her children through pregnancy, childbirth, or breastfeeding.  You expose yourself to complications from the virus. Without treatment, the virus progresses. As it multiplies in your body, it causes the immune system to stop protecting you from infections and other health problems. You may get infections that you would not normally get if your immune system was healthy and working properly (opportunistic diseases).  You put yourself at risk of side effects from HIV medicines. HIV medicines (antiretroviral therapy, ART) can help slow the virus from progressing and prevent its spread to others. People with HIV must take these medicines on a daily basis in order to live long, healthy lives. However, these medicines have side effects. Long-term use of ART medicines can lead to chronic health conditions, such as damage to the liver and kidneys, diabetes, and heart disease. People who take HIV medicines must use protection during sex because they can still pass the virus on to sexual partners.  You could also put yourself at high risk for getting other sexually transmitted  infections.  You put yourself at risk of having an unintended pregnancy. Where to find support To get support preventing HIV infection and AIDS:  Talk with your health care provider.  Visit your local health department or clinic.  Consider joining a support group. Where to find more information Learn more about HIV and AIDS from:  U.S. Department of Health and Human Services: www.aids.gov  Centers for Disease Control and Prevention: ? More information about preventing HIV: LocalChronicle.nowww.cdc.gov/hiv/basics/prevention.html ? How to find a location where you can get sexual health materials and treatment for free or for a low cost: gettested.TonerPromos.nocdc.gov Summary  HIV spreads through semen, blood, breast milk, rectal fluid, and vaginal fluid.  HIV is commonly spread through sexual contact and sharing needles or syringes, because these behaviors lead to an exchange of bodily fluids.  To protect yourself from HIV through sexual contact, use a barrier protection method every time you have sex.  Avoid having sex while under the influence of alcohol and drugs. These substances may lead you to engage in high risk behaviors.  Get tested for HIV and make sure your sexual partner(s) get tested too. This information is not intended to replace advice given to you by your health care provider. Make sure you discuss any questions you have with your health care provider. Document Released: 06/05/2016 Document Revised: 10/10/2018 Document Reviewed: 06/05/2016 Elsevier Patient Education  2020 [employer]

## 2019-02-04 ENCOUNTER — Other Ambulatory Visit: Payer: Self-pay | Admitting: Internal Medicine

## 2019-02-04 LAB — URINALYSIS, COMPLETE
Bilirubin, UA: NEGATIVE
Glucose, UA: NEGATIVE
Ketones, UA: NEGATIVE
Leukocytes,UA: NEGATIVE
Nitrite, UA: NEGATIVE
Protein,UA: NEGATIVE
Specific Gravity, UA: 1.013 (ref 1.005–1.030)
Urobilinogen, Ur: 0.2 mg/dL (ref 0.2–1.0)
pH, UA: 6 (ref 5.0–7.5)

## 2019-02-04 LAB — HEPATITIS B CORE ANTIBODY, TOTAL: Hep B Core Total Ab: NEGATIVE

## 2019-02-04 LAB — MICROSCOPIC EXAMINATION
Bacteria, UA: NONE SEEN
Casts: NONE SEEN /lpf

## 2019-02-04 LAB — HEPATITIS B SURFACE ANTIGEN: Hepatitis B Surface Ag: NEGATIVE

## 2019-02-04 LAB — HEPATITIS B SURFACE ANTIBODY,QUALITATIVE: Hep B Surface Ab, Qual: REACTIVE

## 2019-02-04 LAB — HEPATITIS C ANTIBODY: Hep C Virus Ab: 0.7 s/co ratio (ref 0.0–0.9)

## 2019-02-05 ENCOUNTER — Other Ambulatory Visit (INDEPENDENT_AMBULATORY_CARE_PROVIDER_SITE_OTHER): Payer: Medicare HMO

## 2019-02-05 ENCOUNTER — Other Ambulatory Visit: Payer: Self-pay

## 2019-02-05 DIAGNOSIS — I10 Essential (primary) hypertension: Secondary | ICD-10-CM

## 2019-02-05 DIAGNOSIS — Z7252 High risk homosexual behavior: Secondary | ICD-10-CM

## 2019-02-05 DIAGNOSIS — R69 Illness, unspecified: Secondary | ICD-10-CM | POA: Diagnosis not present

## 2019-02-06 LAB — BMP8+ANION GAP
Anion Gap: 17 mmol/L (ref 10.0–18.0)
BUN/Creatinine Ratio: 8 — ABNORMAL LOW (ref 9–20)
BUN: 11 mg/dL (ref 6–20)
CO2: 22 mmol/L (ref 20–29)
Calcium: 9.2 mg/dL (ref 8.7–10.2)
Chloride: 101 mmol/L (ref 96–106)
Creatinine, Ser: 1.33 mg/dL — ABNORMAL HIGH (ref 0.76–1.27)
GFR calc Af Amer: 79 mL/min/{1.73_m2} (ref >59)
GFR calc non Af Amer: 68 mL/min/{1.73_m2} (ref >59)
Glucose: 119 mg/dL — ABNORMAL HIGH (ref 65–99)
Potassium: 4.3 mmol/L (ref 3.5–5.2)
Sodium: 140 mmol/L (ref 134–144)

## 2019-02-06 LAB — TSH: TSH: 0.936 u[IU]/mL (ref 0.450–4.500)

## 2019-02-12 ENCOUNTER — Telehealth: Payer: Self-pay | Admitting: Internal Medicine

## 2019-02-12 NOTE — Telephone Encounter (Signed)
Called patient to discuss about starting PrEP treatment, but unfortunately was not able to reach him. I will try again.   Samuel Mage, MD Internal Medicine PGY3 [phone] 02/12/2019, 3:49 PM

## 2019-02-13 ENCOUNTER — Telehealth: Payer: Self-pay | Admitting: Internal Medicine

## 2019-02-13 MED ORDER — EMTRICITABINE-TENOFOVIR AF 200-25 MG PO TABS: 1.0000 | ORAL_TABLET | Freq: Every day | ORAL | 0 refills | Status: DC

## 2019-02-13 NOTE — Telephone Encounter (Signed)
Called and spoke to patient regarding sending over descovy to his pharmacy for pickup. I counseled patient on how to take the medication and about side effects. Patient will have follow up in 4 weeks.   Lars Mage, MD Internal Medicine PGY3 [id number]:[phone] 02/13/2019, 10:46 AM

## 2019-02-17 ENCOUNTER — Other Ambulatory Visit: Payer: Self-pay | Admitting: Internal Medicine

## 2019-02-17 ENCOUNTER — Ambulatory Visit: Payer: Medicare HMO

## 2019-02-17 NOTE — Progress Notes (Signed)
done

## 2019-02-17 NOTE — Progress Notes (Signed)
Internal Medicine Clinic Attending  Case discussed with Dr. Chundi at the time of the visit.  We reviewed the resident's history and exam and pertinent patient test results.  I agree with the assessment, diagnosis, and plan of care documented in the resident's note. 

## 2019-02-19 ENCOUNTER — Other Ambulatory Visit: Payer: Self-pay | Admitting: Internal Medicine

## 2019-02-20 ENCOUNTER — Other Ambulatory Visit: Payer: Self-pay | Admitting: Internal Medicine

## 2019-02-20 DIAGNOSIS — I10 Essential (primary) hypertension: Secondary | ICD-10-CM

## 2019-03-03 ENCOUNTER — Encounter: Payer: Medicare HMO | Admitting: Internal Medicine

## 2019-03-15 NOTE — Progress Notes (Signed)
   CC: Hypertension and PrEP follow up  HPI:  [name] is a 38 y.o. male with hypertension, prediabetes gout, morbid obesity, and high risk sexual behavior who is following up for hypertension and prep management. Please see problem based charting for evaluation, assessment, and plan.  Past Medical History:  Diagnosis Date  . Asthma    Childhood, not active  . CHEST PAIN UNSPECIFIED 05/03/2010   Qualifier: Diagnosis of  By: Jorene Minors, Scott    . ELECTROCARDIOGRAM, ABNORMAL 05/03/2010   Qualifier: Diagnosis of  By: Jorene Minors, Scott    . Gout    Most recent flare 01/2018  . HYPERLIPIDEMIA 05/03/2010   Qualifier: Diagnosis of  By: Jorene Minors, Scott    . Hypertension   . Keratoconus of both eyes   . Legally blind    Review of Systems:    Review of Systems  Constitutional: Negative for chills and fever.  Respiratory: Negative for cough and shortness of breath.   Cardiovascular: Negative for chest pain.  Neurological: Positive for headaches. Negative for dizziness.  Psychiatric/Behavioral: Negative for depression.   Physical Exam:  Vitals:   03/17/19 0848 03/17/19 0922  BP: (!) 167/85 (!) 154/90  Pulse: 87 89  Temp: 98 F (36.7 C)   TempSrc: Oral   SpO2: 100% 100%  Weight: (!) 301 lb 4.8 oz (136.7 kg)   Height: 6' 1.5" (1.867 m)    Physical Exam  Constitutional: Appears well-developed and well-nourished. No distress.  HENT:  Head: Normocephalic and atraumatic.  Eyes: Conjunctivae are normal. Mild conjunctival injection Cardiovascular: Normal rate, regular rhythm and normal heart sounds.  Respiratory: Effort normal and breath sounds normal. No respiratory distress. No wheezes.  GI: Soft. Bowel sounds are normal. No distension. There is no tenderness.  Musculoskeletal: No edema.  Neurological: Is alert.  Skin: Not diaphoretic. No erythema.  Psychiatric: Normal mood and affect. Behavior is normal. Judgment and thought content normal.    Assessment & Plan:    See Encounters Tab for problem based charting.  Patient discussed with Dr. Philipp Ovens

## 2019-03-17 ENCOUNTER — Ambulatory Visit (INDEPENDENT_AMBULATORY_CARE_PROVIDER_SITE_OTHER): Payer: Medicare HMO | Admitting: Internal Medicine

## 2019-03-17 ENCOUNTER — Other Ambulatory Visit: Payer: Self-pay

## 2019-03-17 ENCOUNTER — Encounter: Payer: Self-pay | Admitting: Internal Medicine

## 2019-03-17 VITALS — BP 154/90 | HR 89 | Temp 98.0°F | Ht 73.5 in | Wt 301.3 lb

## 2019-03-17 DIAGNOSIS — Z23 Encounter for immunization: Secondary | ICD-10-CM

## 2019-03-17 DIAGNOSIS — Z720 Tobacco use: Secondary | ICD-10-CM | POA: Diagnosis not present

## 2019-03-17 DIAGNOSIS — R7303 Prediabetes: Secondary | ICD-10-CM | POA: Diagnosis not present

## 2019-03-17 DIAGNOSIS — M109 Gout, unspecified: Secondary | ICD-10-CM

## 2019-03-17 DIAGNOSIS — Z7251 High risk heterosexual behavior: Secondary | ICD-10-CM

## 2019-03-17 DIAGNOSIS — F419 Anxiety disorder, unspecified: Secondary | ICD-10-CM

## 2019-03-17 DIAGNOSIS — Z79899 Other long term (current) drug therapy: Secondary | ICD-10-CM

## 2019-03-17 DIAGNOSIS — Z7252 High risk homosexual behavior: Secondary | ICD-10-CM

## 2019-03-17 DIAGNOSIS — I10 Essential (primary) hypertension: Secondary | ICD-10-CM | POA: Diagnosis not present

## 2019-03-17 DIAGNOSIS — Z6839 Body mass index (BMI) 39.0-39.9, adult: Secondary | ICD-10-CM | POA: Diagnosis not present

## 2019-03-17 DIAGNOSIS — R69 Illness, unspecified: Secondary | ICD-10-CM | POA: Diagnosis not present

## 2019-03-17 MED ORDER — CARVEDILOL 6.25 MG PO TABS: 3.1250 mg | ORAL_TABLET | Freq: Two times a day (BID) | ORAL | 0 refills | Status: DC

## 2019-03-17 NOTE — Assessment & Plan Note (Signed)
Patient with phq9 of 5. States that he doesn't like how the sertraline makes him feel so he has stopped taking it. He continues to have good mood without medication.

## 2019-03-17 NOTE — Assessment & Plan Note (Signed)
Patient was started on descovy one month ago after doing preliminary hiv, hepatitis, ua, and bmp screening. The patient is tolerating descovy well and is adherent to medication.

## 2019-03-17 NOTE — Assessment & Plan Note (Addendum)
The patient's blood pressure during this visit was 167/85 and repeat 154/90. The patient is currently taking carvedilol 3.125mg  bid, amlodipine 10mg  qd, losartan-hctz 100-25mg  qd. His last blood pressure visits are    BP Readings from Last 3 Encounters:  03/17/19 (!) 154/90  02/03/19 (!) 186/116  01/15/19 (!) 167/105   The patient does not report palpitations, dizziness, chest pain, sob. States that his headaches have decreased in frequency.   Assessment and Plan  -increased carvedilol to 6.25mg  bid  -continue all other medications  -Started patient on orlistat 60mg  tid to address obesity as a secondary cause of htn. Will check with pharmacy to make sure that orlistat does not interfere with other medications (especially descovy) -counseled patient on smoking cessation. He said he will work on cutting down by next visit -Follow up in 4 weeks

## 2019-03-17 NOTE — Progress Notes (Signed)
Internal Medicine Clinic Attending  Case discussed with Dr. Chundi at the time of the visit.  We reviewed the resident's history and exam and pertinent patient test results.  I agree with the assessment, diagnosis, and plan of care documented in the resident's note. 

## 2019-03-17 NOTE — Patient Instructions (Signed)
It was a pleasure to see you today [name]. Please make the following changes:  For weight loss: please start on orlistat (alli) 60mg  three times daily that can be bought over the counter. Goal weight loss in 4 weeks 4-8lbs. For hypertension: please increase carvedilol to 6.125mg  twice daily  Follow up in 4 weeks  If you have any questions or concerns, please call our clinic at [phone] between 9am-5pm and after hours call [phone] and ask for the internal medicine resident on call. If you feel you are having a medical emergency please call 911.   Thank you, we look forward to help you remain healthy!  Samuel CourierVahini Fitzhugh Vizcarrondo, MD Internal Medicine PGY3   Orlistat capsules What is this medicine? Orlistat (OR li stat) is used to help people lose weight and maintain weight loss while eating a reduced-calorie diet. This medicine decreases the amount of fat that is absorbed from your diet. This medicine may be used for other purposes; ask your health care provider or pharmacist if you have questions. COMMON BRAND NAME(S): alli, Xenical What should I tell my health care provider before I take this medicine? They need to know if you have any of these conditions:  an eating disorder, such as anorexia or bulimia  diabetes  gallbladder disease  HIV or AIDS  kidney stones  liver disease or hepatitis  organ transplant  pancreatic disease  problems absorbing food  seizures  stomach or intestine problems  thyroid disease  take medicines that treat or prevent blood clots  an unusual or allergic reaction to orlistat, other medicines, foods, dyes, supplements or preservatives  pregnant or trying to get pregnant  breast-feeding How should I use this medicine? Take this medicine by mouth with a glass of water. Follow the directions on the prescription label. Take this medicine with each main meal that contains about 30 percent of the calories from fat or within 1 hour after each  meal. Do not take your medicine more often than directed. If you occasionally miss a meal or have a meal without fat, you can skip that dose of this medicine. Talk to your pediatrician regarding the use of this medicine in children. Special care may be needed. While this drug may be prescribed for children as young as 12 years for selected conditions, precautions do apply. Use of this medicine without a prescription is not approved in children less than 18 years. Overdosage: If you think you have taken too much of this medicine contact a poison control center or emergency room at once. NOTE: This medicine is only for you. Do not share this medicine with others. What if I miss a dose? If you miss a dose, take it within 1 hour following the meal that contains fat. If it is almost time for your next dose, take only that dose. Do not take double or extra doses. If you occasionally miss a meal or have a meal without fat, you can skip that dose of this medicine. What may interact with this medicine?  amiodarone  antiviral medicines for HIV or AIDS  certain medicines for hepatitis  certain medicines that treat or prevent blood clots like warfarin, enoxaparin, dalteparin, apixaban, dabigatran, edoxaban, and rivaroxaban  cyclosporine  medicines for diabetes  medicines for seizures  other medicines or products for weight loss  supplements like vitamins A, D, E and K  thyroid hormones This list may not describe all possible interactions. Give your health care provider a list of all the medicines,  herbs, non-prescription drugs, or dietary supplements you use. Also tell them if you smoke, drink alcohol, or use illegal drugs. Some items may interact with your medicine. What should I watch for while using this medicine? Do not use this medicine if you have had an organ transplant. This medicine interferes with some of the medicines used to prevent transplant rejection. This medicine can cause  decreased absorption of some vitamins. You should take a daily multivitamin that contains normal amounts of vitamins D, E, K and beta-carotene or vitamin A. Take the multivitamin once per day at bedtime unless otherwise directed by your doctor or healthcare professional. You should use this medicine with a reduced-calorie diet that contains no more than about 30 percent of the calories from fat. Divide your daily intake of fat, carbohydrates, and protein evenly over your 3 main meals. Follow a well-balanced, reduced-calorie, low fat diet. Try starting this diet before taking this medicine. Following a low fat diet can help reduce the possible side effects from this medicine. Do not use this medicine if you are pregnant. Losing weight while pregnant is not advised and may cause harm to the unborn child. Talk to your health care professional or pharmacist for more information. This medicine may cause a decrease in vitamin D. You should make sure that you get enough vitamin D while you are taking this medicine. Discuss the foods you eat and the vitamins you take with your health care professional. What side effects may I notice from receiving this medicine? Side effects that you should report to your doctor or health care professional as soon as possible:  allergic reactions like skin rash, itching or hives, swelling of the face, lips, or tongue  breathing problems  bloody or black, tarry stools  signs of infection like fever or chills  signs and symptoms of kidney stones like blood in the urine; pain in the lower back or side; pain when urinating  signs and symptoms of liver injury like dark yellow or brown urine; general ill feeling or flu-like symptoms; light-colored stools; loss of appetite; nausea; right upper belly pain; unusually weak or tired; yellowing of the eyes or skin  trouble passing urine or change in the amount of urine  uncontrolled, urgent bowel movements  vomiting Side effects  that usually do not require medical attention (report to your doctor or health care professional if they continue or are bothersome):  increased number of bowel movements  oily stools (bowel movements may be clear, orange or brown in color)  stomach discomfort, gas This list may not describe all possible side effects. Call your doctor for medical advice about side effects. You may report side effects to FDA at 1-800-FDA-1088. Where should I keep my medicine? Keep out of the reach of children. Storage at room temperature between 15 and 30 degrees C (59 and 86 degrees F). Keep container tightly closed. Throw away any unused medicine after the expiration date. NOTE: This sheet is a summary. It may not cover all possible information. If you have questions about this medicine, talk to your doctor, pharmacist, or health care provider.  2020 Elsevier/Gold Standard (2018-07-25 17:12:26)

## 2019-04-06 ENCOUNTER — Telehealth: Payer: Self-pay

## 2019-04-06 NOTE — Telephone Encounter (Signed)
I think he wants to possibly be tested for std's

## 2019-04-06 NOTE — Telephone Encounter (Signed)
Pt is very upset at this time, he had an incident recently where a condom burst and he is worried about the STD situation Also he was just told that his wife is suing for divorce and sole custody of his son. He feels a lot of anxiety at this time appt w/ dr Maricela Bo 10/6 also mentioned he speak w/ dr Maricela Bo about a session w/ ashton, he is agreeable and and relieved to have her available for therapy.will send to dr Samule Dry

## 2019-04-06 NOTE — Telephone Encounter (Signed)
Definitely, if Miquel Dunn can see him that will be super helpful. Does he want a telehealth visit tomorrow or I can just call him.

## 2019-04-06 NOTE — Telephone Encounter (Signed)
He is on my clinic list for tomorrow 10/6 thank you. Can Miquel Dunn also see him in person when he comes in ?

## 2019-04-06 NOTE — Telephone Encounter (Signed)
Requesting to speak with a nurse about something, please call pt back.  

## 2019-04-07 ENCOUNTER — Other Ambulatory Visit: Payer: Self-pay

## 2019-04-07 ENCOUNTER — Encounter: Payer: Self-pay | Admitting: Internal Medicine

## 2019-04-07 ENCOUNTER — Other Ambulatory Visit (HOSPITAL_COMMUNITY)
Admission: RE | Admit: 2019-04-07 | Discharge: 2019-04-07 | Disposition: A | Discharge status: Home / Self Care | Payer: Medicare HMO | Source: Ambulatory Visit | Attending: Internal Medicine | Admitting: Internal Medicine

## 2019-04-07 ENCOUNTER — Ambulatory Visit (INDEPENDENT_AMBULATORY_CARE_PROVIDER_SITE_OTHER): Payer: Medicare HMO | Admitting: Internal Medicine

## 2019-04-07 VITALS — BP 148/90 | HR 76 | Temp 98.0°F | Ht 73.5 in | Wt 309.3 lb

## 2019-04-07 DIAGNOSIS — I10 Essential (primary) hypertension: Secondary | ICD-10-CM | POA: Diagnosis not present

## 2019-04-07 DIAGNOSIS — Z720 Tobacco use: Secondary | ICD-10-CM | POA: Diagnosis not present

## 2019-04-07 DIAGNOSIS — Z79899 Other long term (current) drug therapy: Secondary | ICD-10-CM | POA: Diagnosis not present

## 2019-04-07 DIAGNOSIS — Z7252 High risk homosexual behavior: Secondary | ICD-10-CM

## 2019-04-07 DIAGNOSIS — E041 Nontoxic single thyroid nodule: Secondary | ICD-10-CM

## 2019-04-07 DIAGNOSIS — R69 Illness, unspecified: Secondary | ICD-10-CM | POA: Diagnosis not present

## 2019-04-07 DIAGNOSIS — F419 Anxiety disorder, unspecified: Secondary | ICD-10-CM | POA: Diagnosis not present

## 2019-04-07 DIAGNOSIS — Z6841 Body Mass Index (BMI) 40.0 and over, adult: Secondary | ICD-10-CM | POA: Diagnosis not present

## 2019-04-07 MED ORDER — CARVEDILOL 6.25 MG PO TABS: 3.1250 mg | ORAL_TABLET | Freq: Two times a day (BID) | ORAL | 1 refills | Status: DC

## 2019-04-07 MED ORDER — LOSARTAN POTASSIUM-HCTZ 100-25 MG PO TABS: 1.0000 | ORAL_TABLET | Freq: Every day | ORAL | 1 refills | Status: DC

## 2019-04-07 MED ORDER — EMTRICITABINE-TENOFOVIR AF 200-25 MG PO TABS: 1.0000 | ORAL_TABLET | Freq: Every day | ORAL | 1 refills | Status: AC

## 2019-04-07 NOTE — Assessment & Plan Note (Signed)
The patient presents as he is anxious after having unprotected intercourse with his male partner. He states that he noted that his condom burst when he was having oral and anal intercourse approximately 2 weeks ago. He notes that his partner tested positive for syphillis.   Of note, the patient has a history of syphillis in July 2020.   The patient denies any sore throat, rash, fevers, chills, rashes, penile discharge, penile irritation.   Assessment and plan  Will evaluate patient for stis and hiv. The patient has been on prep (descovy) for the past 4 weeks now. He states that he ran out of descovy and has only missed the dose for the past 1 week. Based on the patient's history he should be protected from getting hiv, but will still check ab.   -urine gonorrhea, chlamydia, candida, trichomonas  -oral gonorrhea, chlamydia, trichomonas -hiv ab -rpr with reflex to fta abs

## 2019-04-07 NOTE — Patient Instructions (Addendum)
It was a pleasure to see you today Samuel Decker.   I have taken some blood and urine tests during this visit and will call you about the results.   Please schedule a visit with Miquel Dunn to talk about your mood.   Please make sure to pick up your blood pressure medications  Follow up in 2 weeks  If you have any questions or concerns, please call our clinic at [phone] between 9am-5pm and after hours call [phone] and ask for the internal medicine resident on call. If you feel you are having a medical emergency please call 911.   Thank you, we look forward to help you remain healthy!  Lars Mage, MD Internal Medicine PGY3

## 2019-04-07 NOTE — Telephone Encounter (Signed)
The patient is not on my schedule for this afternoon probably because my schedule looks full.  If I have a cancellation I am more than happy to fit him in. I will also plan to contact him to see if he wants to set up counseling.

## 2019-04-07 NOTE — Assessment & Plan Note (Signed)
Patient states that he is very anxious due to a custody battle between him and his ex wife.   -patient following with Miquel Dunn.

## 2019-04-07 NOTE — Progress Notes (Signed)
   CC: Follow up after high risk sexual behavior  HPI:  [name] is a 38 y.o. male with hypertension, anxiety disorder, thyroid nodule who presents for evaluation following high risk sexual behavior. Please see problem based charting for evaluation, assessment, and plan.     Past Medical History:  Diagnosis Date  . Asthma    Childhood, not active  . CHEST PAIN UNSPECIFIED 05/03/2010   Qualifier: Diagnosis of  By: Jorene Minors, Scott    . ELECTROCARDIOGRAM, ABNORMAL 05/03/2010   Qualifier: Diagnosis of  By: Jorene Minors, Scott    . Gout    Most recent flare 01/2018  . HYPERLIPIDEMIA 05/03/2010   Qualifier: Diagnosis of  By: Jorene Minors, Scott    . Hypertension   . Keratoconus of both eyes   . Legally blind    Review of Systems:   Denies fever, sore throat, chills  Physical Exam:  Vitals:   04/07/19 1407  BP: (!) 162/97  Pulse: 85  Temp: 98 F (36.7 C)  TempSrc: Oral  SpO2: 100%  Weight: (!) 309 lb 4.8 oz (140.3 kg)  Height: 6' 1.5" (1.867 m)   Physical Exam  Constitutional: He is oriented to person, place, and time. He appears well-developed and well-nourished. No distress.  HENT:  Head: Normocephalic and atraumatic.  Eyes: Scleral icterus is present.  Cardiovascular: Normal rate, regular rhythm and normal heart sounds.  Respiratory: Effort normal and breath sounds normal. No respiratory distress. He has no wheezes.  GI: Soft. Bowel sounds are normal. He exhibits no distension. There is no abdominal tenderness.  Genitourinary:    Penis normal.  Right testis shows no mass and no swelling. Left testis shows no mass and no swelling. No penile tenderness.  Neurological: He is alert and oriented to person, place, and time.  Skin: He is not diaphoretic.  Psychiatric: His behavior is normal. Thought content normal. His mood appears anxious.   Assessment & Plan:   See Encounters Tab for problem based charting.  Patient discussed with Dr. Rebeca Alert

## 2019-04-07 NOTE — Assessment & Plan Note (Signed)
The patient's blood pressure during this visit was 162/97 and repeat 148/90. The patient is currently taking losartan-hctz 100-25mg  qd, carvedilol 3.125mg  bid, amlodipine 10mg  qd. His last blood pressure visits are   BP Readings from Last 3 Encounters:  04/07/19 (!) 148/90  03/17/19 (!) 154/90  02/03/19 (!) 186/116   Assessment and Plan The patient is currently not at goal. Although the patient states that he is adherent to his medication, his refill history is not congruent. The patient also has other risk factors for htn which include morbid obesity and tobacco use disorder.   The patient is currently not able to afford weight loss pills. He states that he is going to start working out and working on tobacco cessation.   Follow up in 2 weeks to check if blood pressure is controlled

## 2019-04-09 LAB — RPR, QUANT+TP ABS (REFLEX)
Rapid Plasma Reagin, Quant: 1:64 {titer} — ABNORMAL HIGH
T Pallidum Abs: REACTIVE — AB

## 2019-04-09 LAB — HIV ANTIBODY (ROUTINE TESTING W REFLEX): HIV Screen 4th Generation wRfx: NONREACTIVE

## 2019-04-09 LAB — RPR: RPR Ser Ql: REACTIVE — AB

## 2019-04-13 NOTE — Progress Notes (Signed)
Internal Medicine Clinic Attending  Case discussed with Dr. Chundi at the time of the visit.  We reviewed the resident's history and exam and pertinent patient test results.  I agree with the assessment, diagnosis, and plan of care documented in the resident's note.  Alexander Raines, M.D., Ph.D.  

## 2019-04-15 ENCOUNTER — Telehealth: Payer: Self-pay | Admitting: Internal Medicine

## 2019-04-15 LAB — CYTOLOGY, (ORAL, ANAL, URETHRAL) ANCILLARY ONLY
Chlamydia: NEGATIVE
Comment: NEGATIVE
Comment: NEGATIVE
Comment: NORMAL
Neisseria Gonorrhea: NEGATIVE
Trichomonas: NEGATIVE

## 2019-04-15 NOTE — Telephone Encounter (Signed)
Called patient to discuss about rpr result. The patient's titer 3 months ago was 1:128 and currently it is 1:64 which I feel is indicative of a serofast state rather than a new infection. Spoke to Dr. Tommy Medal in ID and he recommended rechecking rpr in 3 months.   Will recheck rpr in 3 months with goal of 4 fould decrease in rpr for resolution of syphilis.  Still waiting on urine and oral cytology results.   Lars Mage, MD Internal Medicine PGY3 [phone]

## 2019-04-19 NOTE — Progress Notes (Signed)
   CC: Left sided chest wall pain  HPI:  [name] is a 38 y.o. male with hypertension, prediabetes thyroid nodule, anxiety disorder who presents for blood pressure follow up. Please see problem based charting for evaluation, assessment, and plan.  Past Medical History:  Diagnosis Date  . Asthma    Childhood, not active  . CHEST PAIN UNSPECIFIED 05/03/2010   Qualifier: Diagnosis of  By: Jorene Minors, Scott    . ELECTROCARDIOGRAM, ABNORMAL 05/03/2010   Qualifier: Diagnosis of  By: Jorene Minors, Scott    . Gout    Most recent flare 01/2018  . HYPERLIPIDEMIA 05/03/2010   Qualifier: Diagnosis of  By: Jorene Minors, Scott    . Hypertension   . Keratoconus of both eyes   . Legally blind    Review of Systems:    Review of Systems  Constitutional: Negative for chills and fever.  Respiratory: Negative for cough and shortness of breath.   Cardiovascular: Negative for chest pain.  Gastrointestinal: Negative for abdominal pain, nausea and vomiting.  Neurological: Negative for dizziness and headaches.   Physical Exam:  Vitals:   04/21/19 0910 04/21/19 0919  BP: (!) 153/93 (!) 148/78  Pulse: 74 74  Temp: 98.1 F (36.7 C)   TempSrc: Oral   SpO2: 100%   Weight: (!) 311 lb 3.2 oz (141.2 kg)   Height: 6\' 1"  (1.854 m)    Physical Exam  Constitutional: He appears well-developed and well-nourished. No distress.  HENT:  Head: Normocephalic and atraumatic.  Eyes: Conjunctivae are normal.  Cardiovascular: Normal rate, regular rhythm and normal heart sounds.  Respiratory: Effort normal and breath sounds normal. No respiratory distress. He has no wheezes.  GI: Soft. Bowel sounds are normal. He exhibits no distension. There is no abdominal tenderness.  Musculoskeletal:        General: No edema.  Neurological: He is alert.  Skin: He is not diaphoretic.  0.5cm in size circular not-raised areas of hyperpigmentation under left breast. Tenderness to palpate under left breast.   Psychiatric: He has a normal mood and affect. His behavior is normal. Judgment and thought content normal.    Assessment & Plan:   See Encounters Tab for problem based charting.  Patient discussed with Dr. Lynnae January

## 2019-04-21 ENCOUNTER — Ambulatory Visit (INDEPENDENT_AMBULATORY_CARE_PROVIDER_SITE_OTHER): Payer: Medicare HMO | Admitting: Internal Medicine

## 2019-04-21 ENCOUNTER — Encounter: Payer: Self-pay | Admitting: Internal Medicine

## 2019-04-21 ENCOUNTER — Other Ambulatory Visit: Payer: Self-pay

## 2019-04-21 VITALS — BP 148/78 | HR 74 | Temp 98.1°F | Ht 73.0 in | Wt 311.2 lb

## 2019-04-21 DIAGNOSIS — R7303 Prediabetes: Secondary | ICD-10-CM | POA: Diagnosis not present

## 2019-04-21 DIAGNOSIS — R0789 Other chest pain: Secondary | ICD-10-CM

## 2019-04-21 DIAGNOSIS — E041 Nontoxic single thyroid nodule: Secondary | ICD-10-CM | POA: Diagnosis not present

## 2019-04-21 DIAGNOSIS — I10 Essential (primary) hypertension: Secondary | ICD-10-CM | POA: Diagnosis not present

## 2019-04-21 DIAGNOSIS — Z23 Encounter for immunization: Secondary | ICD-10-CM | POA: Diagnosis not present

## 2019-04-21 DIAGNOSIS — Z79899 Other long term (current) drug therapy: Secondary | ICD-10-CM

## 2019-04-21 DIAGNOSIS — R69 Illness, unspecified: Secondary | ICD-10-CM | POA: Diagnosis not present

## 2019-04-21 DIAGNOSIS — F419 Anxiety disorder, unspecified: Secondary | ICD-10-CM

## 2019-04-21 MED ORDER — GABAPENTIN 300 MG PO CAPS: 300.0000 mg | ORAL_CAPSULE | Freq: Every day | ORAL | 0 refills | Status: DC | PRN

## 2019-04-21 MED ORDER — CAPSAICIN 0.035 % EX CREA: 1.0000 "application " | TOPICAL_CREAM | Freq: Two times a day (BID) | CUTANEOUS | 0 refills | Status: DC | PRN

## 2019-04-21 NOTE — Addendum Note (Signed)
Addended by: Marcelino Duster on: 04/21/2019 10:44 AM   Modules accepted: Orders

## 2019-04-21 NOTE — Progress Notes (Signed)
Internal Medicine Clinic Attending  Case discussed with Dr. Chundi at the time of the visit.  We reviewed the resident's history and exam and pertinent patient test results.  I agree with the assessment, diagnosis, and plan of care documented in the resident's note. 

## 2019-04-21 NOTE — Assessment & Plan Note (Signed)
The patient's blood pressure during this visit was 153/93 and repeat 148/78. The patient is currently taking losartan-hctz 100-25mg  qd, carvedilol 3.125mg  bid, amlodipine 10mg  qd. His last blood pressure visits are   BP Readings from Last 3 Encounters:  04/21/19 (!) 148/78  04/07/19 (!) 148/90  03/17/19 (!) 154/90  The patient does not report palpitations, dizziness, chest pain, sob.  Assessment and Plan Patient's blood pressure is much better controlled at this visit. He states that he misses taking his medication approximately one time every week. He was given a pill box to help him sort out his medication.   -continue current regimen

## 2019-04-21 NOTE — Assessment & Plan Note (Signed)
  The patient states that he has had several year history of 7/10 intensity, sharp pain that is present under his left breast and radiates to his back in a dermatomal pattern. He states that the pain is present intermittently and worsens somewhat when he takes a deep breath. There is no change in the pain after he eats. Alleviated when he raises his left arm above his head and with ibuprofen. He states that he noticed a rash under his left breast one week ago. He describes the "rash" as darkened areas that are not raised.   Patient was evaluated for this pain with soft tissue chest [place] on 02/13/19 which was unremarkable for soft tissue lesion, focal fluid, or lymphadenopathy.   Assessment and plan  I am most concerned about the patient having post-herpetic neuralgia given that the pain is present in a dermatomal distribution.   Other causes of the pain include splenic rupture and infarction, diverticulitis, pancreatitis, rib fracture, compression fracture, gastritis. However, the patients signs/symptoms, labs, and imaging do not support these findings.   -Prescribed capsaicin cream and gabapentin 300mg  to use on a prn basis  -patient is not eligible for varicella vaccine at this time -will assess for resolution of pain

## 2019-04-21 NOTE — Patient Instructions (Addendum)
It was a pleasure to see you today Samuel Decker. Please make the following changes:  Congratulations on your improved blood pressure readings. Continue to keep this up! Take all of the medication indicated on this form.   I feel that your pain is likely secondary to post-herpetic neuralgia. I have prescribed some capsaicin cream for you along with low dose gabapentin for the pain.   If you have any questions or concerns, please call our clinic at [phone] between 9am-5pm and after hours call [phone] and ask for the internal medicine resident on call. If you feel you are having a medical emergency please call 911.   Thank you, we look forward to help you remain healthy!  Samuel Mage, MD Internal Medicine PGY3   Postherpetic Neuralgia Postherpetic neuralgia (PHN) is nerve pain that occurs after a shingles infection. Shingles is a painful rash that appears on one area of the body, usually on the trunk or face. Shingles is caused by the varicella-zoster virus. This is the same virus that causes chickenpox. In people who have had chickenpox, the virus can resurface years later and cause shingles. You may have PHN if you continue to have pain for 4 months after your shingles rash has gone away. PHN appears in the same area where you had the shingles rash. The pain usually goes away after the rash disappears. Getting a vaccination for shingles can prevent PHN. This vaccine is recommended for people older than 60. It may prevent shingles, and may also lower your risk of PHN if you do get shingles. What are the causes? This condition is caused by damage to your nerves from the varicella-zoster virus. The damage makes your nerves overly sensitive. What increases the risk? The following factors may make you more likely to develop this condition:  Being older than 36 years of age.  Having severe pain before your shingles rash starts.  Having a severe rash.  Having shingles in and around  the eye area.  Having a disease that makes your body unable to fight infections (weak immune system). What are the signs or symptoms? The main symptom of this condition is pain. The pain may:  Often be very bad and may be described as stabbing, burning, or feeling like an electric shock.  Come and go or may be there all the time.  Be triggered by light touches on the skin or changes in temperature. You may have itching along with the pain. How is this diagnosed? This condition may be diagnosed based on your symptoms and your history of shingles. Lab studies and other diagnostic tests are usually not needed. How is this treated? There is no cure for this condition. Treatment for PHN will focus on pain relief. Over-the-counter pain relievers do not usually relieve PHN pain. You may need to work with a pain specialist. Treatment may include:  Antidepressant medicines to help with pain and improve sleep.  Anti-seizure medicines to relieve nerve pain.  Strong pain relievers (opioids).  A numbing patch worn on the skin (lidocaine patch).  Botox (botulinum toxin) injections to block pain signals between nerves and muscles.  Injections of numbing medicine or anti-inflammatory medicines around irritated nerves. Follow these instructions at home:   It may take a long time to recover from PHN. Work closely with your health care provider and develop a good support system at home.  Take over-the-counter and prescription medicines only as told by your health care provider.  Do not drive or use heavy machinery  while taking prescription pain medicine.  Wear loose, comfortable clothing.  Cover sensitive areas with a dressing to reduce friction from clothing rubbing on the area.  If directed, put ice on the painful area: ? Put ice in a plastic bag. ? Place a towel between your skin and the bag. ? Leave the ice on for 20 minutes, 2-3 times a day.  Talk to your health care provider if you  feel depressed or desperate. Living with long-term pain can be depressing.  Keep all follow-up visits as told by your health care provider. This is important. Contact a health care provider if:  Your medicine is not helping.  You are struggling to manage your pain at home. Summary  Postherpetic neuralgia is a very painful disorder that can occur after an episode of shingles.  The pain is often severe, burning, electric, or stabbing.  Prescription medicines can be helpful in managing persistent pain.  Getting a vaccination for shingles can prevent PHN. This vaccine is recommended for people older than 60. This information is not intended to replace advice given to you by your health care provider. Make sure you discuss any questions you have with your health care provider. Document Released: 09/08/2002 Document Revised: 05/31/2017 Document Reviewed: 09/04/2016 Elsevier Patient Education  2020 [employer].

## 2019-08-18 ENCOUNTER — Telehealth: Payer: Self-pay

## 2019-08-18 NOTE — Telephone Encounter (Signed)
RTC, states he his having swelling in his left ankle.  Pt states he thinks it might be r/t gout, but has only been bothered by gout in his great toe.  Pt also has a hx of HTN, nurse advises pt to come in for evaluation and b/p check and he is agreeable.  Appt offered in [facility] this week, pt request appt next Monday, appt in [facility] made for 08/24/19 @ 0845. SChaplin, RN,BSN

## 2019-08-18 NOTE — Telephone Encounter (Signed)
Thank you :)

## 2019-08-18 NOTE — Telephone Encounter (Signed)
Requesting medicine for gout. Please call pt back.

## 2019-08-24 ENCOUNTER — Encounter: Payer: Self-pay | Admitting: Radiation Oncology

## 2019-08-24 ENCOUNTER — Other Ambulatory Visit: Payer: Self-pay

## 2019-08-24 ENCOUNTER — Ambulatory Visit (INDEPENDENT_AMBULATORY_CARE_PROVIDER_SITE_OTHER): Payer: Medicare HMO | Admitting: Radiation Oncology

## 2019-08-24 VITALS — BP 184/98 | HR 73 | Temp 98.2°F | Ht 73.0 in | Wt 317.5 lb

## 2019-08-24 DIAGNOSIS — M25472 Effusion, left ankle: Secondary | ICD-10-CM | POA: Diagnosis not present

## 2019-08-24 DIAGNOSIS — I10 Essential (primary) hypertension: Secondary | ICD-10-CM

## 2019-08-24 DIAGNOSIS — Z113 Encounter for screening for infections with a predominantly sexual mode of transmission: Secondary | ICD-10-CM | POA: Diagnosis not present

## 2019-08-24 DIAGNOSIS — R69 Illness, unspecified: Secondary | ICD-10-CM | POA: Diagnosis not present

## 2019-08-24 DIAGNOSIS — F17210 Nicotine dependence, cigarettes, uncomplicated: Secondary | ICD-10-CM | POA: Diagnosis not present

## 2019-08-24 DIAGNOSIS — Z9114 Patient's other noncompliance with medication regimen: Secondary | ICD-10-CM

## 2019-08-24 DIAGNOSIS — Z7252 High risk homosexual behavior: Secondary | ICD-10-CM

## 2019-08-24 DIAGNOSIS — Z0000 Encounter for general adult medical examination without abnormal findings: Secondary | ICD-10-CM

## 2019-08-24 DIAGNOSIS — Z79899 Other long term (current) drug therapy: Secondary | ICD-10-CM

## 2019-08-24 DIAGNOSIS — Z87438 Personal history of other diseases of male genital organs: Secondary | ICD-10-CM

## 2019-08-24 MED ORDER — ATORVASTATIN CALCIUM 40 MG PO TABS: 40.0000 mg | ORAL_TABLET | Freq: Every day | ORAL | 3 refills | Status: DC

## 2019-08-24 MED ORDER — NICOTINE 14 MG/24HR TD PT24: 14.0000 mg | MEDICATED_PATCH | Freq: Every day | TRANSDERMAL | 0 refills | Status: DC | PRN

## 2019-08-24 MED ORDER — CHANTIX STARTING MONTH PAK 0.5 MG X 11 & 1 MG X 42 PO TABS: ORAL_TABLET | ORAL | 0 refills | Status: DC

## 2019-08-24 MED ORDER — CARVEDILOL 6.25 MG PO TABS: 3.1250 mg | ORAL_TABLET | Freq: Two times a day (BID) | ORAL | 3 refills | Status: DC

## 2019-08-24 MED ORDER — AMLODIPINE BESYLATE 10 MG PO TABS: 10.0000 mg | ORAL_TABLET | Freq: Every day | ORAL | 3 refills | Status: DC

## 2019-08-24 MED ORDER — LOSARTAN POTASSIUM-HCTZ 100-25 MG PO TABS: 1.0000 | ORAL_TABLET | Freq: Every day | ORAL | 3 refills | Status: DC

## 2019-08-24 NOTE — Progress Notes (Signed)
   CC: ankle swelling  HPI:  Mr.Senon D Bungert is a 39 y.o. male who presents to the [facility] Medicine Clinic for ankle swelling. Please see Assessment and Plan for full HPI.  Past Medical History:  Diagnosis Date  . Asthma    Childhood, not active  . CHEST PAIN UNSPECIFIED 05/03/2010   Qualifier: Diagnosis of  By: Huntley Dec, Scott    . ELECTROCARDIOGRAM, ABNORMAL 05/03/2010   Qualifier: Diagnosis of  By: Huntley Dec, Scott    . Gout    Most recent flare 01/2018  . HYPERLIPIDEMIA 05/03/2010   Qualifier: Diagnosis of  By: Huntley Dec, Scott    . Hypertension   . Keratoconus of both eyes   . Legally blind    Review of Systems:  Please see Assessment and Plan for full ROS.  Physical Exam:  Vitals:   08/24/19 0901  BP: (!) 184/98  Pulse: 73  Temp: 98.2 F (36.8 C)  TempSrc: Oral  SpO2: 100%  Weight: (!) 317 lb 8 oz (144 kg)  Height: 6\' 1"  (1.854 m)   Physical Exam  Constitutional: He is oriented to person, place, and time and well-developed, well-nourished, and in no distress. No distress.  HENT:  Head: Normocephalic and atraumatic.  Pulmonary/Chest: Effort normal. No respiratory distress.  Musculoskeletal:        General: No tenderness, deformity or edema. Normal range of motion.     Comments: No swelling of the left ankle, non tender to palpation, no deformity, wounds or signs of trauma  Neurological: He is alert and oriented to person, place, and time.  Skin: Skin is warm and dry. No rash noted. He is not diaphoretic. No erythema.  Nursing note and vitals reviewed.  Assessment & Plan:   See Encounters Tab for problem based charting.  Patient discussed with Dr. 

## 2019-08-24 NOTE — Assessment & Plan Note (Addendum)
Patient with recent hx of syphilis requesting STD recheck. Reports using condoms regularly. No concerns for STDs at this time but wants to be safe.  Plan: -hiv ab -hep c ab -rpr  -fu in 4 weeks with PCP

## 2019-08-24 NOTE — Assessment & Plan Note (Addendum)
Today's Vitals   08/24/19 0901 08/24/19 0903  BP: (!) 184/98   Pulse: 73   Temp: 98.2 F (36.8 C)   TempSrc: Oral   SpO2: 100%   Weight: (!) 317 lb 8 oz (144 kg)   Height: 6\' 1"  (1.854 m)   PainSc:  8    Body mass index is 41.89 kg/m.  Patient with hx of chronic, uncontrolled hypertension likely secondary to medication nonadherence. He reports missing his medications multiple times per week. Unclear if he picked his last refills up. We discussed the importance of medication adherence. He is anxious about developing heart failure like members of his family. He was counseled on the importance of blood pressure management to avoid this. He states he has a pill box and will start taking them daily. He needs refills. He is amenable to referral to chronic care management services.  Plan: -refill meds -referral to chronic care management services -CMP and UA today -fu with PCP in 4 weeks

## 2019-08-24 NOTE — Assessment & Plan Note (Signed)
Patient with hx of tobacco use who reports to have cut down to half a pack per day. He was counseled on the importance of stopping smoking and offered medication assistance with chantix and NRT. He was amenable.  Plan: -chantix starter pack -nicotine patches -fu in 4 weeks with PCP

## 2019-08-24 NOTE — Patient Instructions (Addendum)
Thank you for coming to your appointment. It was so nice to see you. Today we discussed  Louden was seen today for joint swelling.  Diagnoses and all orders for this visit:  Hypertension, unspecified type -     amLODipine (NORVASC) 10 MG tablet; Take 1 tablet (10 mg total) by mouth daily -     losartan-hydrochlorothiazide (HYZAAR) 100-25 MG tablet; Take 1 tablet by mouth daily. -     carvedilol (COREG) 6.25 MG tablet; Take 0.5 tablets (3.125 mg total) by mouth 2 (two) times daily with a meal. -     atorvastatin (LIPITOR) 40 MG tablet; Take 1 tablet (40 mg total) by mouth daily at 6 PM. -     Referral to Chronic Care Management Services -     CMP14 + Anion Gap -     Urinalysis, Reflex Microscopic  Ankle swelling, left       -     Rest, ice, tylenol, elevation  STD Testing -     HIV antibody (with reflex) -     Hepatitis C antibody -     RPR  Smoking cessation -     nicotine (NICODERM CQ - DOSED IN MG/24 HOURS) 14 mg/24hr patch; Place 1 patch (14 mg total) onto the skin daily as needed (tobacco craving). -     varenicline (CHANTIX STARTING MONTH PAK) 0.5 MG X 11 & 1 MG X 42 tablet; Take one 0.5 mg tablet by mouth once daily for 3 days, then increase to one 0.5 mg tablet twice daily for 4 days, then increase to one 1 mg tablet twice daily.  If labs were drawn today, I will call you with any abnormal results.   Sincerely,  Jenell Milliner, MD

## 2019-08-24 NOTE — Assessment & Plan Note (Addendum)
Patient presenting with occasional left ankle swelling. He denies fevers, trauma, severe pain, wound, rash, calf swelling and tenderness. 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 He is afebrile and on exam his ankle is non edematous without signs of wound or injury. He can walk without difficulty. He says his biggest concern is developing heart failure like some members of his family. We discussed that such swelling would likely be bilateral and pitting. We discussed the most important things he can do are to control his blood pressure and stop smoking, both discussed in separate problems.  Plan: -rest, ice, elevation and tylenol prn -fu in 4 weeks with PCP

## 2019-08-25 ENCOUNTER — Telehealth: Payer: Self-pay | Admitting: *Deleted

## 2019-08-25 LAB — MICROSCOPIC EXAMINATION
Bacteria, UA: NONE SEEN
Casts: NONE SEEN /lpf

## 2019-08-25 LAB — URINALYSIS, ROUTINE W REFLEX MICROSCOPIC
Bilirubin, UA: NEGATIVE
Glucose, UA: NEGATIVE
Leukocytes,UA: NEGATIVE
Nitrite, UA: NEGATIVE
Specific Gravity, UA: >=1.03 — AB (ref 1.005–1.030)
Urobilinogen, Ur: 0.2 mg/dL (ref 0.2–1.0)
pH, UA: 5.5 (ref 5.0–7.5)

## 2019-08-25 NOTE — Telephone Encounter (Addendum)
Information was sent through CoverMyMeds for PA for Chantix.  Awaiting determination.  Angelina Ok, RN 08/25/2019 9:13 AM. Valinda Hoar from [employer] Chantix was approved 07/03/2019 thru 02/21/2020.  Angelina Ok, RN 08/25/2019 1:24 PM.

## 2019-08-26 LAB — RPR, QUANT+TP ABS (REFLEX)
Rapid Plasma Reagin, Quant: 1:16 {titer} — ABNORMAL HIGH
T Pallidum Abs: REACTIVE — AB

## 2019-08-26 LAB — CMP14 + ANION GAP
ALT: 42 IU/L (ref 0–44)
AST: 37 IU/L (ref 0–40)
Albumin/Globulin Ratio: 1.4 (ref 1.2–2.2)
Albumin: 4.2 g/dL (ref 4.0–5.0)
Alkaline Phosphatase: 105 IU/L (ref 39–117)
Anion Gap: 14 mmol/L (ref 10.0–18.0)
BUN/Creatinine Ratio: 10 (ref 9–20)
BUN: 14 mg/dL (ref 6–20)
Bilirubin Total: 0.3 mg/dL (ref 0.0–1.2)
CO2: 22 mmol/L (ref 20–29)
Calcium: 9.5 mg/dL (ref 8.7–10.2)
Chloride: 105 mmol/L (ref 96–106)
Creatinine, Ser: 1.34 mg/dL — ABNORMAL HIGH (ref 0.76–1.27)
GFR calc Af Amer: 78 mL/min/{1.73_m2} (ref >59)
GFR calc non Af Amer: 67 mL/min/{1.73_m2} (ref >59)
Globulin, Total: 3 g/dL (ref 1.5–4.5)
Glucose: 102 mg/dL — ABNORMAL HIGH (ref 65–99)
Potassium: 4.3 mmol/L (ref 3.5–5.2)
Sodium: 141 mmol/L (ref 134–144)
Total Protein: 7.2 g/dL (ref 6.0–8.5)

## 2019-08-26 LAB — HIV ANTIBODY (ROUTINE TESTING W REFLEX): HIV Screen 4th Generation wRfx: NONREACTIVE

## 2019-08-26 LAB — RPR: RPR Ser Ql: REACTIVE — AB

## 2019-08-26 LAB — HEPATITIS C ANTIBODY: Hep C Virus Ab: 0.4 s/co ratio (ref 0.0–0.9)

## 2019-08-26 NOTE — Progress Notes (Signed)
Can patient come back to [facility] in 2-4 weeks for work up of his microscopic hematuria?

## 2019-08-28 NOTE — Progress Notes (Signed)
Internal Medicine Clinic Attending  Case discussed with Dr. Lanierat the time of the visit.  We reviewed the resident's history and exam and pertinent patient test results.  I agree with the assessment, diagnosis, and plan of care documented in the resident's note.   

## 2019-09-14 ENCOUNTER — Ambulatory Visit: Payer: Self-pay | Admitting: *Deleted

## 2019-09-14 NOTE — Chronic Care Management (AMB) (Signed)
  Chronic Care Management   Outreach Note  09/14/2019 Name: Samuel Decker MRN: [id number] DOB: 1980/09/23  Referred by: Lorenso Courier, MD Reason for referral : Chronic Care Management (HTN, asthma,HLD)   An unsuccessful telephone outreach was attempted today. The patient was referred to the case management team for assistance with care management and care coordination for HTN , asthma and prediabetes. Unable to leave message on contact number as recording stated voice mailbox is full.  Follow Up Plan:  The care management team will reach out to the patient again over the next 7 days.   Cranford Mon RN, CCM, CDCES CCM Clinic RN Care Manager [phone]

## 2019-09-21 ENCOUNTER — Ambulatory Visit: Payer: Self-pay | Admitting: *Deleted

## 2019-09-21 DIAGNOSIS — E7800 Pure hypercholesterolemia, unspecified: Secondary | ICD-10-CM

## 2019-09-21 DIAGNOSIS — I10 Essential (primary) hypertension: Secondary | ICD-10-CM

## 2019-09-21 NOTE — Chronic Care Management (AMB) (Signed)
  Chronic Care Management   Note  09/21/2019 Name: Samuel Decker MRN: [id number] DOB: August 13, 1980   Second attempt to reach patient successful. Explained chronic care management program and offered to assist patient with self management strategies for chronic disease states. He states the reason his blood pressure was not controlled in the past was because her woldnot get his blood pressure medicines filled in a timely manner and when he did, he would often miss taking them. He states he is now adherent with his medications and his blood pressure is no longer as issue. He states he is not interested in the program at this time. Enouraged him to reach out to the chronic care management team or notify his providers at the internal medicine clinic if he would like to receive the services in the future.    Follow up plan: No further follow up required: as patient is refusing chronic care managememt services at this time.  Cranford Mon RN, CCM, CDCES CCM Clinic RN Care Manager [phone]

## 2019-09-22 NOTE — Progress Notes (Signed)
Internal Medicine Clinic Resident  I have personally reviewed this encounter including the documentation in this note and/or discussed this patient with the care management provider. I will address any urgent items identified by the care management provider and will communicate my actions to the patient's PCP. I have reviewed the patient's CCM visit with my supervising attending.  Rjay Revolorio, MD 09/22/2019    

## 2019-09-23 NOTE — Progress Notes (Signed)
Internal Medicine Clinic Attending  CCM services provided by the care management provider and their documentation were discussed with Dr. Harbrecht. We reviewed the pertinent findings, urgent action items addressed by the resident and non-urgent items to be addressed by the PCP.  I agree with the assessment, diagnosis, and plan of care documented in the CCM and resident's note.  Marcine Gadway, MD 09/23/2019  

## 2019-10-06 ENCOUNTER — Encounter: Payer: Medicare HMO | Admitting: Internal Medicine

## 2019-10-21 ENCOUNTER — Telehealth: Payer: Self-pay

## 2019-10-21 DIAGNOSIS — I10 Essential (primary) hypertension: Secondary | ICD-10-CM

## 2019-10-21 NOTE — Telephone Encounter (Signed)
Questions about meds,   amLODipine (NORVASC) 10 MG tablet   atorvastatin (LIPITOR) 40 MG tablet  carvedilol (COREG) 6.25 MG tablet  losartan-hydrochlorothiazide (HYZAAR) 100-25 MG tablet. Please call pt back.

## 2019-10-21 NOTE — Telephone Encounter (Signed)
RTC, pt asking what he is taking amlodipine, carvedilol, and losartan-HCTZ for?  RN informed pt he is taking these medications for high b/p and that the losartan-HCTZ also has a diuretic in it.  Pt verbalized understanding.  As of note, on 09/21/19 CCM reached out to patient and he declined CCM services.  This RN educated patient about CCM and encouraged pt to use this service.  Pt states he is not interested at this time and refuses CCM services again.   SChaplin, RN,BSN

## 2019-10-21 NOTE — Telephone Encounter (Signed)
Called pt, ph cutoff while speaking, attempted call back, vmail full. Will await his call

## 2019-11-19 ENCOUNTER — Encounter: Payer: Medicare HMO | Admitting: Internal Medicine

## 2019-11-19 NOTE — Progress Notes (Deleted)
   CC: Hypertension follow up  HPI:  [name] is a 39 y.o. male with hypertension, thyroid nodule, anxiety disorder, obesity who presents for follow up of hypertension. Please see problem based charting for evaluation, assessment, and plan.  The patient's blood presssure during thsi visit is ***. The patient is currently taking losartan-hctz 100-25mg  qd and carvedilol 3.125mg  bid.       Past Medical History:  Diagnosis Date  . Asthma    Childhood, not active  . CHEST PAIN UNSPECIFIED 05/03/2010   Qualifier: Diagnosis of  By: Huntley Dec, Scott    . ELECTROCARDIOGRAM, ABNORMAL 05/03/2010   Qualifier: Diagnosis of  By: Huntley Dec, Scott    . Gout    Most recent flare 01/2018  . HYPERLIPIDEMIA 05/03/2010   Qualifier: Diagnosis of  By: Huntley Dec, Scott    . Hypertension   . Keratoconus of both eyes   . Legally blind    Review of Systems:  ***  Physical Exam:  There were no vitals filed for this visit. ***  Assessment & Plan:   See Encounters Tab for problem based charting.  Patient {GC/GE:3044014::"discussed with","seen with"} Dr. {NAMES:[id number]::"Butcher","Guilloud","Hoffman","Mullen","Narendra","Raines","Vincent"}'

## 2019-11-23 ENCOUNTER — Other Ambulatory Visit: Payer: Self-pay

## 2019-11-23 ENCOUNTER — Ambulatory Visit (INDEPENDENT_AMBULATORY_CARE_PROVIDER_SITE_OTHER): Payer: Medicare HMO | Admitting: Internal Medicine

## 2019-11-23 ENCOUNTER — Other Ambulatory Visit (HOSPITAL_COMMUNITY)
Admission: RE | Admit: 2019-11-23 | Discharge: 2019-11-23 | Disposition: A | Discharge status: Home / Self Care | Payer: Medicare HMO | Source: Ambulatory Visit | Attending: Internal Medicine | Admitting: Internal Medicine

## 2019-11-23 ENCOUNTER — Encounter: Payer: Self-pay | Admitting: Internal Medicine

## 2019-11-23 VITALS — BP 185/123 | HR 74 | Temp 98.1°F | Ht 73.0 in | Wt 329.2 lb

## 2019-11-23 DIAGNOSIS — Z79899 Other long term (current) drug therapy: Secondary | ICD-10-CM | POA: Diagnosis not present

## 2019-11-23 DIAGNOSIS — R3129 Other microscopic hematuria: Secondary | ICD-10-CM | POA: Diagnosis not present

## 2019-11-23 DIAGNOSIS — I10 Essential (primary) hypertension: Secondary | ICD-10-CM | POA: Diagnosis not present

## 2019-11-23 DIAGNOSIS — Z7252 High risk homosexual behavior: Secondary | ICD-10-CM | POA: Diagnosis not present

## 2019-11-23 DIAGNOSIS — M7542 Impingement syndrome of left shoulder: Secondary | ICD-10-CM

## 2019-11-23 DIAGNOSIS — R69 Illness, unspecified: Secondary | ICD-10-CM | POA: Diagnosis not present

## 2019-11-23 DIAGNOSIS — F419 Anxiety disorder, unspecified: Secondary | ICD-10-CM | POA: Diagnosis not present

## 2019-11-23 MED ORDER — DULOXETINE HCL 30 MG PO CPEP: 30.0000 mg | ORAL_CAPSULE | Freq: Every day | ORAL | 0 refills | Status: DC

## 2019-11-23 NOTE — Progress Notes (Signed)
   CC: Hypertension follow up   HPI:  Samuel Decker is a 39 y.o. with hypertension, morbid obesity, thyroid nodule who presents for follow up of blood pressure. Please see problem based charting for evaluation, assessment, and plan.  Past Medical History:  Diagnosis Date  . Asthma    Childhood, not active  . CHEST PAIN UNSPECIFIED 05/03/2010   Qualifier: Diagnosis of  By: Huntley Dec, Scott    . ELECTROCARDIOGRAM, ABNORMAL 05/03/2010   Qualifier: Diagnosis of  By: Huntley Dec, Scott    . Gout    Most recent flare 01/2018  . HYPERLIPIDEMIA 05/03/2010   Qualifier: Diagnosis of  By: Huntley Dec, Scott    . Hypertension   . Keratoconus of both eyes   . Legally blind    Review of Systems:     Denies nausea, vomiting, fever, chills   Physical Exam:  Vitals:   11/23/19 0853 11/23/19 0925  BP: (!) 176/111 (!) 185/123  Pulse: 79 74  Temp: 98.1 F (36.7 C)   TempSrc: Oral   SpO2: 100%   Weight: (!) 329 lb 3.2 oz (149.3 kg)   Height: 6\' 1"  (1.854 m)    Physical Exam  Constitutional: He appears well-developed and well-nourished. No distress.  Cardiovascular: Normal rate, regular rhythm and normal heart sounds.  Respiratory: Effort normal and breath sounds normal. No respiratory distress. He has no wheezes.  GI: Soft. Bowel sounds are normal. He exhibits no distension. There is no abdominal tenderness.  Musculoskeletal:        General: No edema.  Neurological: He is alert.  Skin: He is not diaphoretic.  Psychiatric: He is slowed. He exhibits a depressed mood.   Assessment & Plan:   See Encounters Tab for problem based charting.  Patient discussed with Dr. 

## 2019-11-23 NOTE — Patient Instructions (Addendum)
It was a pleasure to see you today Mr. Yearby. Please make the following changes:  For your high blood pressure: -please make sure to take amlodipine, losartan-hctz, carvedilol  -please cut down on smoking  -please get sleep study done   For your left shoulder pain: -you have been referred to physical therapy  -please use ibuprofen every 8hrs as needed, you may also apply voltaren gel   For your anxiety: -please start taking cymbalta 30mg  daily  -please follow up with for behavioral health counseling   Other:  -we got blood work from you today  -will call descovy in based on results   Follow up in 2 weeks  If you have any questions or concerns, please call our clinic at [phone] between 9am-5pm and after hours call [phone] and ask for the internal medicine resident on call. If you feel you are having a medical emergency please call 911.   Thank you, we look forward to help you remain healthy!  [phone], MD Internal Medicine PGY3   To schedule an appointment for a COVID vaccine or be added to the vaccine wait list: Go to Lorenso Courier   OR Go to [email or web address]                  OR Call [phone]                                     OR Call [phone] and select Option 2

## 2019-11-24 DIAGNOSIS — R3129 Other microscopic hematuria: Secondary | ICD-10-CM | POA: Insufficient documentation

## 2019-11-24 DIAGNOSIS — M7542 Impingement syndrome of left shoulder: Secondary | ICD-10-CM | POA: Insufficient documentation

## 2019-11-24 DIAGNOSIS — Z79899 Other long term (current) drug therapy: Secondary | ICD-10-CM | POA: Insufficient documentation

## 2019-11-24 LAB — CYTOLOGY, (ORAL, ANAL, URETHRAL) ANCILLARY ONLY
Chlamydia: NEGATIVE
Comment: NEGATIVE
Comment: NEGATIVE
Comment: NORMAL
Neisseria Gonorrhea: NEGATIVE
Trichomonas: NEGATIVE

## 2019-11-24 LAB — URINALYSIS, ROUTINE W REFLEX MICROSCOPIC
Bilirubin, UA: NEGATIVE
Glucose, UA: NEGATIVE
Ketones, UA: NEGATIVE
Nitrite, UA: NEGATIVE
Protein,UA: NEGATIVE
RBC, UA: NEGATIVE
Specific Gravity, UA: 1.015 (ref 1.005–1.030)
Urobilinogen, Ur: 0.2 mg/dL (ref 0.2–1.0)
pH, UA: 7 (ref 5.0–7.5)

## 2019-11-24 LAB — MICROSCOPIC EXAMINATION
Bacteria, UA: NONE SEEN
Casts: NONE SEEN /lpf

## 2019-11-24 LAB — URINE CYTOLOGY ANCILLARY ONLY
Chlamydia: NEGATIVE
Comment: NEGATIVE
Comment: NEGATIVE
Comment: NORMAL
Neisseria Gonorrhea: NEGATIVE
Trichomonas: NEGATIVE

## 2019-11-24 NOTE — Assessment & Plan Note (Signed)
1+ RBC was found on urinalysis at last visit. Will repeat.

## 2019-11-24 NOTE — Assessment & Plan Note (Signed)
Patient states that he has been having pain in his left shoulder for past 2-3 years. Starts in his neck and travels to shoulder and travels down left arm. Achy throbbing type pain. He has not taken any medication for the pain.   Patient had cervical spine xray done in 2019 that showed mild c6-c7 discogenic degenerative changes. On physical exam the patient had positive hawkins and neers exam.   Assessment and plan  Patient's symptoms are likely secondary to shoulder impingement. Will send to physical therapy. Told to use nsaids over the counter for pain control. If pain persists may do steroid injection.

## 2019-11-24 NOTE — Assessment & Plan Note (Signed)
The patient states that he has been very anxious and moody recently. He scores 14 on phq9. Previously patient did well on sertraline, but he stopped the medication as he felt that he could do well without it.   Assessment and plan  The patient states that he would like a medication to assist with mood control. Prescribed cymbalta 30mg  qd to help with both anxiety and depression and radicular symptoms. Will also refer to integrated behavioral health.

## 2019-11-24 NOTE — Assessment & Plan Note (Signed)
Patient was previously on descovy August-October 2020. The refilled prescription was subsequently not picked up.   The patient states that he is not currently sexually active.  Assessment and plan  Will do std testing and bmp again prior to re-initiating descovy.   -oral and urinary gonorrhea, chlamydia, trichomonas -syphillis -hiv test  -bmp q 6 months

## 2019-11-24 NOTE — Assessment & Plan Note (Signed)
The patient's blood pressure during this visit was 185/123. The patient is currently taking losartan-hctz 100-25mg  qd, carvedilol 3.125mg  bid, amlodipine 10mg  qd . His last blood pressure visits are   BP Readings from Last 3 Encounters:  11/23/19 (!) 185/123  08/24/19 (!) 184/98  04/21/19 (!) 148/78   Patient stated that he missed taking his medication 1-2 times this week.   Assessment and plan  Counseled the patient to ensure that he is adherent to his medication regimen. The patient should also be tested for secondary causes of his hypertension such as osa. He scores a 4/high on stop bang questionnaire. Will refer for sleep testing. Counseled on smoking cessation. Also needs to work on weight reduction.

## 2019-11-25 LAB — BMP8+ANION GAP
Anion Gap: 15 mmol/L (ref 10.0–18.0)
BUN/Creatinine Ratio: 10 (ref 9–20)
BUN: 12 mg/dL (ref 6–20)
CO2: 20 mmol/L (ref 20–29)
Calcium: 9.2 mg/dL (ref 8.7–10.2)
Chloride: 104 mmol/L (ref 96–106)
Creatinine, Ser: 1.18 mg/dL (ref 0.76–1.27)
GFR calc Af Amer: 91 mL/min/{1.73_m2} (ref >59)
GFR calc non Af Amer: 78 mL/min/{1.73_m2} (ref >59)
Glucose: 101 mg/dL — ABNORMAL HIGH (ref 65–99)
Potassium: 4.4 mmol/L (ref 3.5–5.2)
Sodium: 139 mmol/L (ref 134–144)

## 2019-11-25 LAB — RPR, QUANT+TP ABS (REFLEX)
Rapid Plasma Reagin, Quant: 1:16 {titer} — ABNORMAL HIGH
T Pallidum Abs: REACTIVE — AB

## 2019-11-25 LAB — HIV ANTIBODY (ROUTINE TESTING W REFLEX): HIV Screen 4th Generation wRfx: NONREACTIVE

## 2019-11-25 LAB — RPR: RPR Ser Ql: REACTIVE — AB

## 2019-11-26 NOTE — Progress Notes (Signed)
Internal Medicine Clinic Attending  Case discussed with Dr. Chundi at the time of the visit.  We reviewed the resident's history and exam and pertinent patient test results.  I agree with the assessment, diagnosis, and plan of care documented in the resident's note. 

## 2019-11-27 ENCOUNTER — Telehealth: Payer: Self-pay | Admitting: Internal Medicine

## 2019-11-27 NOTE — Telephone Encounter (Signed)
Called pt - informed of test results per Dr Delma Officer: "Patient with normal electrolytes, kidney function, STI testing. RPR decreased fourfold is appropriate response to therapy." per Dr Delma Officer Stated thanks for calling.

## 2019-11-27 NOTE — Telephone Encounter (Signed)
Patient rtn call to f/u with lab work.

## 2019-12-01 ENCOUNTER — Ambulatory Visit: Payer: Medicare HMO | Admitting: *Deleted

## 2019-12-01 DIAGNOSIS — I10 Essential (primary) hypertension: Secondary | ICD-10-CM

## 2019-12-01 DIAGNOSIS — Z87898 Personal history of other specified conditions: Secondary | ICD-10-CM

## 2019-12-01 NOTE — Chronic Care Management (AMB) (Signed)
  Care Management   Note  12/01/2019 Name: Samuel Decker MRN: [id number] DOB: 1980-12-23  Successful telephone outreach to patient via contact number. Discussed the chronic care management program and he agrees to participate however he requests the initial intake call be rescheduled for later this week.   Telephone follow up appointment with care management team member scheduled for:12/04/19 at 9:00 am.  Cranford Mon RN, CCM, CDCES CCM Clinic RN Care Manager [phone]

## 2019-12-03 ENCOUNTER — Ambulatory Visit: Payer: Medicare HMO | Attending: Internal Medicine | Admitting: Physical Therapy

## 2019-12-03 ENCOUNTER — Encounter: Payer: Self-pay | Admitting: Physical Therapy

## 2019-12-03 ENCOUNTER — Other Ambulatory Visit: Payer: Self-pay

## 2019-12-03 DIAGNOSIS — M62838 Other muscle spasm: Secondary | ICD-10-CM | POA: Diagnosis not present

## 2019-12-03 DIAGNOSIS — M25512 Pain in left shoulder: Secondary | ICD-10-CM | POA: Insufficient documentation

## 2019-12-03 DIAGNOSIS — M6281 Muscle weakness (generalized): Secondary | ICD-10-CM | POA: Diagnosis not present

## 2019-12-03 DIAGNOSIS — R293 Abnormal posture: Secondary | ICD-10-CM | POA: Diagnosis not present

## 2019-12-03 NOTE — Therapy (Addendum)
[place], [place], [address] Phone: [phone]   Fax:  [phone]  Physical Therapy Evaluation/Discharge  Patient Details  Name: Samuel Decker MRN: [id number] Date of Birth: 1980-12-02 Referring Provider (PT): Dr Raechel Ache   Encounter Date: 12/03/2019  PT End of Session - 12/03/19 0818    Visit Number  1    Number of Visits  6    Date for PT Re-Evaluation  01/14/20    Authorization Type  [employer] [facility]    PT Start Time  0818    PT Stop Time  0902    PT Time Calculation (min)  44 min    Activity Tolerance  Patient limited by pain    Behavior During Therapy  [name] for tasks assessed/performed       Past Medical History:  Diagnosis Date  . Asthma    Childhood, not active  . CHEST PAIN UNSPECIFIED 05/03/2010   Qualifier: Diagnosis of  By: Jorene Minors, Scott    . ELECTROCARDIOGRAM, ABNORMAL 05/03/2010   Qualifier: Diagnosis of  By: Jorene Minors, Scott    . Gout    Most recent flare 01/2018  . HYPERLIPIDEMIA 05/03/2010   Qualifier: Diagnosis of  By: Jorene Minors, Scott    . Hypertension   . Keratoconus of both eyes   . Legally blind     Past Surgical History:  Procedure Laterality Date  . "groin surgery"    . EYE SURGERY     Penetrating Keratoplasty    There were no vitals filed for this visit.   Subjective Assessment - 12/03/19 0819    Subjective  Pt reports his Lt shoulder started bothering him abou t1.5 yrs ago.  No injury.  He has seent he MD and they ruled out neck issues.  When he raises his arm he can hear and feel the bones rubbing and occassionally the pain goes into his arm.    Diagnostic tests  xrays negative for shoulder and neck    Patient Stated Goals  reduce pain and get back in the gym    Currently in Pain?  Yes    Pain Score  6     Pain Location  Shoulder    Pain Orientation  Left    Pain Descriptors / Indicators  Throbbing    Pain Type  Chronic pain    Pain Radiating  Towards  into hand some times    Pain Onset  More than a month ago    Pain Frequency  Constant    Aggravating Factors   lifting the arm    Pain Relieving Factors  stretching it out or holding by his side         [facility] PT Assessment - 12/03/19 0001      Assessment   Medical Diagnosis  Lt shoulder impingement    Referring Provider (PT)  Dr Raechel Ache    Onset Date/Surgical Date  12/03/18    Hand Dominance  Right   uses left hand alot    Next MD Visit  12/08/2019    Prior Therapy  none      Precautions   Precautions  None      Balance Screen   Has the patient fallen in the past 6 months  No    Has the patient had a decrease in activity level because of a fear of falling?   No    Is the patient reluctant to leave their home because of  a fear of falling?   No      Prior Function   Level of Independence  Independent    Vocation  Full time employment    Vocation Requirements  deliver flowers    Leisure  mime and flag  dance      Observation/Other Assessments   Focus on Therapeutic Outcomes (FOTO)   45% limted      Posture/Postural Control   Posture/Postural Control  Postural limitations    Postural Limitations  Forward head;Rounded Shoulders   obesity     ROM / Strength   AROM / PROM / Strength  AROM;Strength  (Pended)       AROM   AROM Assessment Site  Shoulder;Elbow;Cervical  (Pended)     Right/Left Shoulder  --  (Pended)    WNL, pain at endrange Lt shoulder   Cervical Flexion  to chest  (Pended)     Cervical Extension  WNL  (Pended)     Cervical - Right Rotation  WNL - tightness into Lt shoulder  (Pended)     Cervical - Left Rotation  50  (Pended)       Strength   Strength Assessment Site  Shoulder;Elbow;Hand  (Pended)     Right/Left Shoulder  --  (Pended)    WNL, except Lt ER 4-/5, IR 4/5 painful   Right/Left Elbow  --  (Pended)    Rt 5/5, Lt 4+/5   Right/Left hand  Left;Right  (Pended)     Right Hand Grip (lbs)  85/80/83  (Pended)     Left Hand Grip  (lbs)  65/60/62  (Pended)       Special Tests    Special Tests  Cervical;Rotator Cuff Impingement  (Pended)     Rotator Cuff Impingment tests  HawkinsMerrilyn Puma test;Empty Can test;other2  (Pended)       Hawkins-Kennedy test   Findings  Positive  (Pended)     Side  Left  (Pended)       Empty Can test   Side  Left  (Pended)     Comment  (+) for pain, however able to hold  (Pended)       Other    Findings  Positive  (Pended)     Side  Left  (Pended)     Comment  ER resisted  (Pended)                   Objective measurements completed on examination: See above findings.      [facility] Adult PT Treatment/Exercise - 12/03/19 0001      Exercises   Exercises  Shoulder      Shoulder Exercises: Seated   External Rotation  AROM;Both;10 reps    Other Seated Exercises  scapular retraction, cervical retraction, BWD shoulder rolls      Shoulder Exercises: Isometric Strengthening   External Rotation  3X5"    Internal Rotation  3X5"      Shoulder Exercises: Stretch   Other Shoulder Stretches  mid doorway stretch      Modalities   Modalities  Iontophoresis      Iontophoresis   Type of Iontophoresis  Dexamethasone    Location  Lt ant shoulder    Dose  1.0cc    Time  53mmp patch             PT Education - 12/03/19 0906    Education Details  HEP, POC, FOTO and shoulder mechanics    Person(s) Educated  Patient  Methods  Explanation;Demonstration;Handout    Comprehension  Returned demonstration;Verbalized understanding          PT Long Term Goals - 12/03/19 0911      PT LONG TERM GOAL #1   Title  I with HEP to include return to gym    Time  6    Period  Weeks    Status  New    Target Date  01/14/20      PT LONG TERM GOAL #2   Title  improve FOTO =/< 30% limited    Time  6    Period  Weeks    Status  New    Target Date  01/14/20      PT LONG TERM GOAL #3   Title  improve Lt shoulder IR/ER strength =/> 5-/5 without pain    Time  6    Period   Weeks    Status  New    Target Date  01/14/20      PT LONG TERM GOAL #4   Title  improve Lt cervical rotation =/> 70 degrees to assist with looking for traffic    Time  6    Period  Weeks    Status  New    Target Date  01/14/20      PT LONG TERM GOAL #5   Title  report =/> 755 reduction in Lt UE pain with daily activites    Time  6    Period  Weeks    Status  New    Target Date  01/14/20             Plan - 12/03/19 0906    Clinical Impression Statement  39 yo male with ~ 18 months of progressive Lt shoulder and neck pain that occassionally radiates down his arm.  He reports the neck was ruled out as the problem and he is referred for impingement.  He has full shoulder ROM, decreased Lt cervical rotation, weakness in the upper back and Lt shoulder I/ER.  He has tightness and pain with palpation around the Lt shoulder musculaure and into the neck.  May benefit from some DN.  He is Rt hand dominant however reports he uses the Lt a lot as well.  He wishes to return to the gym so he can work on weight loss.  PT will help to reduce pain, restore strength, muscular balance and restore the full use of his arm. Pt request once a week to start and then possibly everyother week due to finances    Personal Factors and Comorbidities  Comorbidity 3+    Comorbidities  asthma, HTN, anxiety, pbese, legally blind - for close up.    Examination-Activity Limitations  Reach Overhead;Carry    Examination-Participation Restrictions  Other    Stability/Clinical Decision Making  Stable/Uncomplicated    Clinical Decision Making  Low    Rehab Potential  Good    PT Frequency  1x / week    PT Duration  6 weeks    PT Treatment/Interventions  Iontophoresis 71m/ml Dexamethasone;Taping;Vasopneumatic Device;Patient/family education;Moist Heat;Ultrasound;Cryotherapy;Electrical Stimulation;Manual techniques;Dry needling;Therapeutic exercise    PT Next Visit Plan  progress RTC and upper back strengthening, cont  ionto    Consulted and Agree with Plan of Care  Patient       Patient will benefit from skilled therapeutic intervention in order to improve the following deficits and impairments:  Decreased range of motion, Obesity, Increased muscle spasms, Impaired UE functional use, Pain, Decreased strength  Visit Diagnosis: Acute pain of left shoulder - Plan: PT plan of care cert/re-cert  Muscle weakness (generalized) - Plan: PT plan of care cert/re-cert  Other muscle spasm - Plan: PT plan of care cert/re-cert  Abnormal posture - Plan: PT plan of care cert/re-cert     Problem List Patient Active Problem List   Diagnosis Date Noted  . Impingement syndrome of left shoulder 11/24/2019  . On pre-exposure prophylaxis for HIV 11/24/2019  . Microscopic hematuria 11/24/2019  . Ankle swelling, left 08/24/2019  . Allergic conjunctivitis 02/03/2019  . Folliculitis 35/46/5681  . Allergic rhinitis 03/29/2018  . STD Testing 03/29/2018  . Healthcare maintenance 03/29/2018  . Cervical disc disorder with radiculopathy of cervical region   . Morbid obesity ([place])   . Pure hypercholesterolemia   . Left-sided chest wall pain   . Anxiety disorder   . History of prediabetes 02/12/2018  . Thyroid nodule 02/12/2018  . Sleep concern 10/28/2015  . Severe obesity (BMI >= 40) ([place]) 05/13/2013  . GOUT 05/02/2010  . Hypertension 05/02/2010    Jeral Pinch PT  12/03/2019, 9:19 AM  [facility] [address] [place], [place], [address] Phone: [phone]   Fax:  [phone]  Name: Samuel Decker MRN: [id number] Date of Birth: Oct 12, 1980 PHYSICAL THERAPY DISCHARGE SUMMARY  Visits from Start of Care: 1  Current functional level related to goals / functional outcomes: See above   Remaining deficits: See above   Education / Equipment: Anatomy of condition, POC, HEP, exercise form/rationale  Plan: Patient agrees to discharge.  Patient goals were not  met. Patient is being discharged due to not returning since the last visit.  ?????     Jessica C. Hightower PT, DPT 03/04/20 7:37 AM

## 2019-12-03 NOTE — Patient Instructions (Signed)

## 2019-12-04 ENCOUNTER — Telehealth: Payer: Medicare HMO

## 2019-12-04 ENCOUNTER — Ambulatory Visit: Payer: Self-pay | Admitting: *Deleted

## 2019-12-04 DIAGNOSIS — I10 Essential (primary) hypertension: Secondary | ICD-10-CM

## 2019-12-04 DIAGNOSIS — E7800 Pure hypercholesterolemia, unspecified: Secondary | ICD-10-CM

## 2019-12-04 NOTE — Chronic Care Management (AMB) (Signed)
  Chronic Care Management   Outreach Note  12/04/2019 Name: Samuel Decker MRN: [id number] DOB: 04/09/1981  Referred by: Lorenso Courier, MD Reason for referral : Chronic Care Management (HTN, HLD, asthma)   An unsuccessful telephone outreach was attempted today. The patient was referred to the case management team for assistance with care management and care coordination. An initial telephone assessment was scheduled with patient today but unfortunately he did not answer the phone.   Follow Up Plan: A HIPPA compliant phone message was left for the patient providing contact information and requesting a return call.  If patient does not return call, the care management team will reach out to the patient again over the next 7 days.   Cranford Mon RN, CCM, CDCES CCM Clinic RN Care Manager [phone]

## 2019-12-07 ENCOUNTER — Ambulatory Visit: Payer: Medicare HMO | Admitting: Physical Therapy

## 2019-12-07 ENCOUNTER — Telehealth: Payer: Self-pay | Admitting: Physical Therapy

## 2019-12-07 NOTE — Telephone Encounter (Signed)
LVM regarding missed appointment today. Noted when his next appointment is and if he cannot make it to call and we can work to cancel or reschedule that appointment.   Ruvim Risko PT, DPT, LAT, ATC  12/07/19  4:18 PM

## 2019-12-08 ENCOUNTER — Ambulatory Visit (INDEPENDENT_AMBULATORY_CARE_PROVIDER_SITE_OTHER): Payer: Medicare HMO | Admitting: Internal Medicine

## 2019-12-08 ENCOUNTER — Encounter: Payer: Self-pay | Admitting: Internal Medicine

## 2019-12-08 ENCOUNTER — Other Ambulatory Visit: Payer: Self-pay | Admitting: Internal Medicine

## 2019-12-08 VITALS — BP 169/115 | HR 80 | Temp 98.2°F | Ht 74.0 in | Wt 325.4 lb

## 2019-12-08 DIAGNOSIS — F17210 Nicotine dependence, cigarettes, uncomplicated: Secondary | ICD-10-CM

## 2019-12-08 DIAGNOSIS — Z79899 Other long term (current) drug therapy: Secondary | ICD-10-CM

## 2019-12-08 DIAGNOSIS — M7542 Impingement syndrome of left shoulder: Secondary | ICD-10-CM | POA: Diagnosis not present

## 2019-12-08 DIAGNOSIS — K5900 Constipation, unspecified: Secondary | ICD-10-CM | POA: Diagnosis not present

## 2019-12-08 DIAGNOSIS — I10 Essential (primary) hypertension: Secondary | ICD-10-CM

## 2019-12-08 DIAGNOSIS — R69 Illness, unspecified: Secondary | ICD-10-CM | POA: Diagnosis not present

## 2019-12-08 DIAGNOSIS — F419 Anxiety disorder, unspecified: Secondary | ICD-10-CM

## 2019-12-08 MED ORDER — DICLOFENAC SODIUM 1 % EX GEL: 4.0000 g | Freq: Four times a day (QID) | CUTANEOUS | 0 refills | Status: DC

## 2019-12-08 MED ORDER — EMTRICITABINE-TENOFOVIR AF 200-25 MG PO TABS: 1.0000 | ORAL_TABLET | Freq: Every day | ORAL | 0 refills | Status: AC

## 2019-12-08 MED ORDER — SENNA 8.6 MG PO TABS
1.0000 | ORAL_TABLET | Freq: Every day | ORAL | 0 refills | Status: DC | PRN
Start: 2019-12-08 — End: 2020-10-20

## 2019-12-08 NOTE — Patient Instructions (Signed)
It was a pleasure to see you today [name]. Please make the following changes:  For your shoulder impingement:  Please continue physical therapy  Apply voltaren gel as needed   For HIV Prevention: -please restart descovy  For high blood pressure: Please continue taking lisinopril-hydrochrothiazide, amlodipine, carvedilol  Get sleep study completed  Stop smoking   For mood: Follow with behavioral health  Exercise regularly  Stop cymbalta  Start celexa   For constipation Take senna as needed   If you have any questions or concerns, please call our clinic at [phone] between 9am-5pm and after hours call [phone] and ask for the internal medicine resident on call. If you feel you are having a medical emergency please call 911.   Thank you, we look forward to help you remain healthy!  Lorenso Courier, MD Internal Medicine PGY3

## 2019-12-08 NOTE — Progress Notes (Signed)
   CC: Hypertension follow up  HPI:  [name] is a 39 y.o. with hypertension, anxiety disorder, morbid obesity, history of high risk sexual behavior who presents for follow-up of hypertension. Please see problem based charting for evaluation, assessment, and plan.  Past Medical History:  Diagnosis Date  . Asthma    Childhood, not active  . CHEST PAIN UNSPECIFIED 05/03/2010   Qualifier: Diagnosis of  By: Huntley Dec, Scott    . ELECTROCARDIOGRAM, ABNORMAL 05/03/2010   Qualifier: Diagnosis of  By: Huntley Dec, Scott    . Gout    Most recent flare 01/2018  . HYPERLIPIDEMIA 05/03/2010   Qualifier: Diagnosis of  By: Huntley Dec, Scott    . Hypertension   . Keratoconus of both eyes   . Legally blind    Review of Systems:    Denies nausea, vomiting, fevers, chills  Physical Exam:  Vitals:   12/08/19 0903  BP: (!) 169/115  Pulse: 80  Temp: 98.2 F (36.8 C)  TempSrc: Oral  SpO2: 100%  Weight: (!) 325 lb 6.4 oz (147.6 kg)  Height: 6\' 2"  (1.88 m)   Physical Exam  Constitutional: He appears well-developed and well-nourished. No distress.  HENT:  Head: Normocephalic and atraumatic.  Cardiovascular: Normal rate, regular rhythm and normal heart sounds.  Respiratory: Effort normal and breath sounds normal. No respiratory distress. He has no wheezes.  GI: Soft. Bowel sounds are normal. He exhibits no distension. There is no abdominal tenderness.  Neurological: He is alert.  Skin: He is not diaphoretic.  Psychiatric: He has a normal mood and affect. His behavior is normal. Judgment and thought content normal.   Assessment & Plan:   See Encounters Tab for problem based charting.  Patient discussed with Dr. 

## 2019-12-09 DIAGNOSIS — K5900 Constipation, unspecified: Secondary | ICD-10-CM | POA: Insufficient documentation

## 2019-12-09 MED ORDER — AMLODIPINE-ATORVASTATIN 10-40 MG PO TABS: 1.0000 | ORAL_TABLET | Freq: Every day | ORAL | 0 refills | Status: AC

## 2019-12-09 MED ORDER — AMLODIPINE-ATORVASTATIN 10-40 MG PO TABS
1.0000 | ORAL_TABLET | Freq: Every day | ORAL | 11 refills | Status: DC
Start: 2019-12-09 — End: 2019-12-09

## 2019-12-09 MED ORDER — CITALOPRAM HYDROBROMIDE 20 MG PO TABS
20.0000 mg | ORAL_TABLET | Freq: Every day | ORAL | 0 refills | Status: DC
Start: 2019-12-09 — End: 2020-10-20

## 2019-12-09 NOTE — Assessment & Plan Note (Signed)
°  Patient was prescribed cymbalta at last visit and he started taking it approximately 3-4days. Patient states that he has noticed some chest tightness, palpitations, dry heaving, dry throat, dyspnea within few minutes after taking cymbalta. States that the patient feels that the cymbalta has been extremely helpful in decreasing his anxiety, however is worried about the adverse effects. During this visit he PHQ 9 is 10 from prior 14.   Assessment and plan Will stop cymbalta due to adverse effects. Start celexa 20mg  qd. Follow up in 4 weeks. Will continue to work with integrated behavioral health.

## 2019-12-09 NOTE — Assessment & Plan Note (Signed)
At last visit STD and HIV testing was normal.  Electrolytes within normal range. Will re-start patient on descovy. Patient is to have one month follow up to ensure adherence. He should also have q47month blood work.

## 2019-12-09 NOTE — Assessment & Plan Note (Signed)
Patient has started physical therpay for impingement syndrome. Has attended one session so far. Will add voltaren gel to assist with pain control.  -Follow up with physical therapy -voltaren gel

## 2019-12-09 NOTE — Assessment & Plan Note (Addendum)
The patient's blood pressure during this visit was 169/115 without medication this morning. The patient is currently taking amlodipine 10mg  qd, carvedilol 3.125mg  bid, losartan-hctz 100-25mg  qd. His last blood pressure visits are   BP Readings from Last 3 Encounters:  12/08/19 (!) 169/115  11/23/19 (!) 185/123  08/24/19 (!) 184/98   Assessment and Plan The patient's blood pressure continues to be elevated despite being on 4 different antihypertensive agents which is concerning for secondary hypertension. Patient is to get sleep study done to assess for OSA given high stop bang score. He has also cut down on his smoking significantly from 1 ppd to 4-5 cigarettes per day. To decrease pill burden have combined amlodipine-atorvastatin 10-40mg  qd.

## 2019-12-09 NOTE — Assessment & Plan Note (Signed)
Patient states that he has to strain in order to have a bowel movement.   Assessment and plan  Encourage increased intake of water and fiber rich foods. Will also prescribe senna for the patient to use on an as needed basis.

## 2019-12-10 ENCOUNTER — Ambulatory Visit: Payer: Medicare HMO | Admitting: *Deleted

## 2019-12-10 ENCOUNTER — Telehealth: Payer: Medicare HMO

## 2019-12-10 DIAGNOSIS — I10 Essential (primary) hypertension: Secondary | ICD-10-CM

## 2019-12-10 DIAGNOSIS — E7800 Pure hypercholesterolemia, unspecified: Secondary | ICD-10-CM

## 2019-12-10 NOTE — Chronic Care Management (AMB) (Signed)
  Chronic Care Management   Note  12/10/2019 Name: METRO EDENFIELD MRN: [id number] DOB: April 19, 1981  Successful outreach to patient to complete initial assessment. He requests return call next week in the morning.   Follow up plan: Telephone follow up appointment with care management team member scheduled for:12/15/19 at 9:00 am  Cranford Mon RN, CCM, CDCES CCM Clinic RN Care Manager [phone]

## 2019-12-11 NOTE — Progress Notes (Signed)
Internal Medicine Clinic Attending  Case discussed with Dr. Chundi at the time of the visit.  We reviewed the resident's history and exam and pertinent patient test results.  I agree with the assessment, diagnosis, and plan of care documented in the resident's note. 

## 2019-12-15 ENCOUNTER — Ambulatory Visit: Payer: Medicare HMO | Admitting: Physical Therapy

## 2019-12-15 ENCOUNTER — Ambulatory Visit: Payer: Medicare HMO | Admitting: *Deleted

## 2019-12-15 ENCOUNTER — Other Ambulatory Visit: Payer: Self-pay | Admitting: Internal Medicine

## 2019-12-15 DIAGNOSIS — I10 Essential (primary) hypertension: Secondary | ICD-10-CM

## 2019-12-15 DIAGNOSIS — Z7689 Persons encountering health services in other specified circumstances: Secondary | ICD-10-CM

## 2019-12-15 DIAGNOSIS — E7800 Pure hypercholesterolemia, unspecified: Secondary | ICD-10-CM

## 2019-12-15 NOTE — Progress Notes (Signed)
Internal Medicine Clinic Attending  CCM services provided by the care management provider and their documentation were discussed with Dr. Masoudi. We reviewed the pertinent findings, urgent action items addressed by the resident and non-urgent items to be addressed by the PCP.  I agree with the assessment, diagnosis, and plan of care documented in the CCM and resident's note.  Stamatia Masri, MD 12/15/2019 

## 2019-12-15 NOTE — Patient Instructions (Signed)
Visit Information It was nice speaking with you today. I will make referrals to the phar tech, the behavioral helath counselor, the CCM BSW and the care guide to help you with identified barriers to managing your blood pressure.   Goals Addressed              This Visit's Progress     Patient Stated   .  " My blood pressure is high because I need help in dealing with stress and sometimes I can't afford my medications." (pt-stated)        CARE PLAN ENTRY (see longitudinal plan of care for additional care plan information)  Objective:  . Last practice recorded BP readings:  BP Readings from Last 3 Encounters:  12/08/19 (!) 169/115  11/23/19 (!) 185/123  08/24/19 (!) 184/98 .   Marland Kitchen Most recent eGFR/CrCl: No results found for: EGFR  No components found for: CRCL  Current Barriers: patient says he has been under a significant amount of stress especially over the last 1 1/2 years due to his estranged relationship with his ex- wife and 37 year old son; this led to him moving to another residence that resulted in significant financial strain so that he is currently having difficulty affording his medications, paying bills, groceries, physical therapy copays, etc, on his monthly disability (legally blind)  income , he says he has canceled his OP PT sessions through June because he cannot afford the copay, he says when he is able to afford his medications he takes them as prescribed except occasionally on the weekends he will miss doses, he attributes his elevated blood pressure to stress and the inability to afford his medications,  he says he was able to lose 95 lbs in the recent past with diet and exercise but the events of the last 1 1/2 years have caused him to be depressed and unmotivated to exercise and make wiser food choices in order to lose the 25 lbs he has gained back, he says he loves to cook and avoids salt and processed foods, he says he is going to renew his [place]  but he must exercise caution when exercising due to a problem with his left shoulder, he says if he had a blood pressure monitor he would check his blood pressure at home, he says he cannot afford the new antidepressant Celexa but he realizes this might help him with his stress and depression, he says he is very interested in counseling, he also says Dr Maricela Bo told him he needs a sleep study to rule out OSA but he was never contacted about arranging it, . 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 Over the next 30 days, patient will attend all scheduled medical appointments: . Over the next 30 days, patient will demonstrate improved adherence to prescribed treatment plan for hypertension as evidenced by taking all medications as prescribed, and adhering to low sodium/DASH diet  Interventions:  . Evaluation of current treatment plan related to hypertension self management and patient's adherence to plan as established by provider. . Provided education to patient re: stroke prevention, s/s of heart attack and stroke, DASH diet, complications of uncontrolled blood pressure . Reviewed medications with patient and discussed importance of compliance . Discussed plans with patient for ongoing care management follow up and provided patient with direct contact information for care  management team . Referral to care guide for assistance with food resources . Referral to behavioral health counselor to assist with stress reduction . Referral to CCM BSW to address financial strain related to paying bills . Referral to pharmacy technician ,Cheryle Horsfall, for medication assistance, also emailed patient over the counter medication list available for purchase at the [facility] OP pharmacy  . Message provider about sleep study  Patient Self Care  Activities:  . UNABLE to independently control blood pressure to meet targets  Initial goal documentation         Mr. Ramsaran was given information about Chronic Care Management services today including:  1. CCM service includes personalized support from designated clinical staff supervised by his physician, including individualized plan of care and coordination with other care providers 2. 24/7 contact phone numbers for assistance for urgent and routine care needs. 3. Service will only be billed when office clinical staff spend 20 minutes or more in a month to coordinate care. 4. Only one practitioner may furnish and bill the service in a calendar month. 5. The patient may stop CCM services at any time (effective at the end of the month) by phone call to the office staff. 6. The patient will be responsible for cost sharing (co-pay) of up to 20% of the service fee (after annual deductible is met).  Patient agreed to services and verbal consent obtained.   The patient verbalized understanding of instructions provided today and declined a print copy of patient instruction materials.   The care management team will reach out to the patient again over the next 30 days.   Kelli Churn RN, CCM, [facility] RN Care Manager [phone]

## 2019-12-15 NOTE — Progress Notes (Signed)
Internal Medicine Clinic Resident  I have personally reviewed this encounter including the documentation in this note and/or discussed this patient with the care management provider. I will address any urgent items identified by the care management provider and will communicate my actions to the patient's PCP. I have reviewed the patient's CCM visit with my supervising attending, Dr Antony Contras.  Patient has prior orders for sleep studies but it has not been performed. Placed another order for sleep study  Chevis Pretty, MD 12/15/2019

## 2019-12-15 NOTE — Chronic Care Management (AMB) (Signed)
Care Management   Initial Visit Note  12/15/2019 Name: Samuel Decker MRN: [id number] DOB: 05/13/1981  Subjective:   Objective:  Assessment: Samuel Decker is a 39 y.o. year old male who sees Chundi, Vahini, MD for primary care. The care management team was consulted for assistance with care management and care coordination needs related to Disease Management Educational Needs Care Coordination Medication Assistance  Mental Health Counseling.   Review of patient status, including review of consultants reports, relevant laboratory and other test results, and collaboration with appropriate care team members and the patient's provider was performed as part of comprehensive patient evaluation and provision of care management services.    SDOH (Social Determinants of Health) assessments performed: Yes See Care Plan activities for detailed interventions related to SDOH)  SDOH Interventions     Most Recent Value  SDOH Interventions  Food Insecurity Interventions Other (Comment)  [referred to care guide for food resources]  Financial Strain Interventions Other (Comment)  [referral to BSW to address financial reosurce strain]  Housing Interventions Other (Comment)  [referred to BSW for assist with bill pay]  Stress Interventions Other (Comment)  [messaged Ashton Hicks regarding referral to her placed 11/23/19]  Social Connections Interventions Intervention Not Indicated       Outpatient Encounter Medications as of 12/15/2019  Medication Sig  . amLODipine-atorvastatin (CADUET) 10-40 MG tablet Take 1 tablet by mouth daily.  . loratadine (CLARITIN) 10 MG tablet TAKE 1 TABLET BY MOUTH EVERY DAY  . aspirin 81 MG chewable tablet Chew 1 tablet (81 mg total) by mouth daily. (Patient not taking: Reported on 02/12/2018)  . Capsaicin 0.035 % CREA Apply 1 application topically 2 (two) times daily as needed. (Patient not taking: Reported on 12/03/2019)  . carvedilol (COREG) 6.25 MG tablet Take 0.5  tablets (3.125 mg total) by mouth 2 (two) times daily with a meal.  . citalopram (CELEXA) 20 MG tablet Take 1 tablet (20 mg total) by mouth daily.  . diclofenac Sodium (VOLTAREN) 1 % GEL Apply 4 g topically 4 (four) times daily. (Patient not taking: Reported on 12/15/2019)  . emtricitabine-tenofovir AF (DESCOVY) 200-25 MG tablet Take 1 tablet by mouth daily.  . fluticasone (FLONASE) 50 MCG/ACT nasal spray PLACE 1 SPRAY INTO BOTH NOSTRILS DAILY.  . gabapentin (NEURONTIN) 300 MG capsule Take 1 capsule (300 mg total) by mouth daily as needed.  . losartan-hydrochlorothiazide (HYZAAR) 100-25 MG tablet Take 1 tablet by mouth daily.  . nicotine (NICODERM CQ - DOSED IN MG/24 HOURS) 14 mg/24hr patch Place 1 patch (14 mg total) onto the skin daily as needed (tobacco craving). (Patient not taking: Reported on 12/15/2019)  . olopatadine (PATANOL) 0.1 % ophthalmic solution Place 1 drop into both eyes 2 (two) times daily. (Patient not taking: Reported on 12/03/2019)  . senna (SENOKOT) 8.6 MG TABS tablet Take 1 tablet (8.6 mg total) by mouth daily as needed for mild constipation. (Patient not taking: Reported on 12/15/2019)  . varenicline (CHANTIX STARTING MONTH PAK) 0.5 MG X 11 & 1 MG X 42 tablet Take one 0.5 mg tablet by mouth once daily for 3 days, then increase to one 0.5 mg tablet twice daily for 4 days, then increase to one 1 mg tablet twice daily. (Patient not taking: Reported on 12/03/2019)  . [DISCONTINUED] loratadine (CLARITIN) 10 MG tablet TAKE 1 TABLET BY MOUTH EVERY DAY   No facility-administered encounter medications on file as of 12/15/2019.    Goals Addressed                This Visit's Progress     Patient Stated   .  " My blood pressure is high because I need help in dealing with stress and sometimes I can't afford my medications." (pt-stated)        CARE PLAN ENTRY (see longitudinal plan of care for additional care plan information)  Objective:  . Last practice recorded BP readings:  BP  Readings from Last 3 Encounters:  12/08/19 (!) 169/115  11/23/19 (!) 185/123  08/24/19 (!) 184/98 .   Marland Kitchen Most recent eGFR/CrCl: No results found for: EGFR  No components found for: CRCL  Current Barriers: patient says he has been under a significant amount of stress especially over the last 1 1/2 years due to his estranged relationship with his ex- wife and 49 year old son; this led to him moving to another residence that resulted in significant financial strain so that he is currently having difficulty affording his medications, paying bills, groceries, physical therapy copays, etc, on his monthly disability (legally blind)  income , he says he has canceled his OP PT sessions through June because he cannot afford the copay, he says when he is able to afford his medications he takes them as prescribed except occasionally on the weekends he will miss doses, he attributes his elevated blood pressure to stress and the inability to afford his medications,  he says he was able to lose 95 lbs in the recent past with diet and exercise but the events of the last 1 1/2 years have caused him to be depressed and unmotivated to exercise and make wiser food choices in order to lose the 25 lbs he has gained back, he says he loves to cook and avoids salt and processed foods, he says he is going to renew his [place] but he must exercise caution when exercising due to a problem with his left shoulder, he says if he had a blood pressure monitor he would check his blood pressure at home, he says he cannot afford the new antidepressant Celexa but he realizes this might help him with his stress and depression, he says he is very interested in counseling, he also says Dr Maricela Bo told him he needs a sleep study to rule out OSA but he was never contacted about arranging it, . 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 Over the next 30 days, patient will attend all scheduled medical appointments: . Over the next 30 days, patient will demonstrate improved adherence to prescribed treatment plan for hypertension as evidenced by taking all medications as prescribed, and adhering to low sodium/DASH diet  Interventions:  . Evaluation of current treatment plan related to hypertension self management and patient's adherence to plan as established by provider. . Provided education to patient re: stroke prevention, s/s of heart attack and stroke, DASH diet, complications of uncontrolled blood pressure . Reviewed medications with patient and discussed importance of compliance . Discussed plans with patient for ongoing care management follow up and provided patient with direct contact information for care management team . Referral to care guide for assistance with food resources . Referral to behavioral health counselor to assist with stress reduction . Referral to CCM BSW to address financial strain related to paying bills . Referral to pharmacy technician ,Cheryle Horsfall, for medication assistance, also emailed patient over the counter medication list available  for purchase at the [facility] pharmacy  . Message provider about sleep study  Patient Self Care Activities:  . UNABLE to independently control blood pressure to meet targets  Initial goal documentation          Follow up plan:  The care management team will reach out to the patient again over the next 30 days.   Mr. Marquis was given information about Care Management services today including:  1. Care Management services include personalized support from designated clinical staff supervised by a physician, including individualized plan of care and coordination with other care providers 2. 24/7 contact phone numbers for assistance for urgent and routine care needs. 3. The patient may  stop Care Management services at any time (effective at the end of the month) by phone call to the office staff.  Patient agreed to services and verbal consent obtained.  Janet Hauser RN, CCM, CDCES CCM Clinic RN Care Manager [phone] 

## 2019-12-16 ENCOUNTER — Telehealth: Payer: Self-pay

## 2019-12-16 NOTE — Telephone Encounter (Signed)
Spoke to Samuel Decker over the phone--I am mailing pt a patient assistance application for his Ducovy.  Asked pt what his copay was for the Celexa(indicated he needed assistance for this med as well) and what type of assistance he was needing.  He was not able to provide me with any information, stated he did not know how much his medication was.  Samuel Decker stated that he would find out that information(look into it) and see what kind of assistance he would need and let us know.

## 2019-12-17 ENCOUNTER — Ambulatory Visit: Payer: Self-pay

## 2019-12-17 NOTE — Chronic Care Management (AMB) (Signed)
  Chronic Care Management   Outreach Note  12/17/2019 Name: Samuel Decker MRN: [id number] DOB: [birth year]  Referred by: Lorenso Courier, MD Reason for referral : Care Coordination [facility])   An unsuccessful telephone outreach was attempted today. The patient was referred to the case management team for assistance with care management and care coordination. Unable to leave message.  Follow Up Plan: The care management team will reach out to the patient again over the next 7 days.      Malachy Chamber, BSW Embedded Care Coordination Social Worker [address] [facility] [phone]

## 2019-12-18 ENCOUNTER — Encounter: Payer: Self-pay | Admitting: *Deleted

## 2019-12-21 ENCOUNTER — Encounter: Payer: Medicare HMO | Admitting: Physical Therapy

## 2019-12-24 ENCOUNTER — Ambulatory Visit: Payer: Self-pay

## 2019-12-24 NOTE — Chronic Care Management (AMB) (Signed)
  Chronic Care Management   Outreach Note  12/24/2019 Name: Samuel Decker MRN: [id number] DOB: [birth year]  Referred by: Lorenso Courier, MD Reason for referral : Care Coordination [facility])   A second unsuccessful telephone outreach was attempted today. The patient was referred to the case management team for assistance with care management and care coordination. Unable to leave message due to mailbox being full.    Follow Up Plan: The care management team will reach out to the patient again over the next 7 days.      Malachy Chamber, BSW Embedded Care Coordination Social Worker [facility] [phone]

## 2019-12-29 ENCOUNTER — Ambulatory Visit: Payer: Medicare HMO

## 2019-12-29 DIAGNOSIS — E7800 Pure hypercholesterolemia, unspecified: Secondary | ICD-10-CM

## 2019-12-29 DIAGNOSIS — I10 Essential (primary) hypertension: Secondary | ICD-10-CM

## 2019-12-29 NOTE — Patient Instructions (Signed)
Visit Information  Goals Addressed   None     Patient verbalizes understanding of instructions provided today.   Telephone follow up appointment with care management team member scheduled for:01/05/20     Malachy Chamber, BSW Embedded Care Coordination Social Worker [facility] [phone]

## 2019-12-29 NOTE — Chronic Care Management (AMB) (Signed)
  Care Management   Social Work Note  12/29/2019 Name: Samuel Decker MRN: [id number] DOB: 1981-02-25  [name] is a 39 y.o. year old male who sees Quincy Simmonds, MD for primary care. The Care Management BSW was consulted for assistance with [employer] .    Third outreach to patient today to attempt to address need for community resources.   Was able to reach patient, however, he was not available to discuss at time of call.    Follow Up Plan: Appointment scheduled for SW follow up with client by phone on: 01/05/20    Malachy Chamber, BSW Embedded Care Coordination Social Worker [facility] [phone]

## 2020-01-05 ENCOUNTER — Encounter: Payer: Self-pay | Admitting: Licensed Clinical Social Worker

## 2020-01-05 ENCOUNTER — Ambulatory Visit: Payer: Medicare HMO

## 2020-01-05 ENCOUNTER — Telehealth: Payer: Self-pay | Admitting: Licensed Clinical Social Worker

## 2020-01-05 DIAGNOSIS — I10 Essential (primary) hypertension: Secondary | ICD-10-CM

## 2020-01-05 DIAGNOSIS — E7800 Pure hypercholesterolemia, unspecified: Secondary | ICD-10-CM

## 2020-01-05 NOTE — Telephone Encounter (Signed)
Patient was called to discuss a referral from their doctor. Patient did not answer, and the vm box was full.  A letter will also be mailed.

## 2020-01-05 NOTE — Patient Instructions (Signed)
Licensed Clinical Social Worker Visit Information  Goals we discussed today:  Goals Addressed   None       Samuel Decker was given information about Chronic Care Management services today including:  1. CCM service includes personalized support from designated clinical staff supervised by his physician, including individualized plan of care and coordination with other care providers 2. 24/7 contact phone numbers for assistance for urgent and routine care needs. 3. Service will only be billed when office clinical staff spend 20 minutes or more in a month to coordinate care. 4. Only one practitioner may furnish and bill the service in a calendar month. 5. The patient may stop CCM services at any time (effective at the end of the month) by phone call to the office staff. 6. The patient will be responsible for cost sharing (co-pay) of up to 20% of the service fee (after annual deductible is met).  Patient agreed to services and verbal consent obtained.   Patient verbalizes understanding of instructions provided today.   Follow up plan: Patient requested follow up from [facility], Bettina Gavia, [place] Worker [place] [phone]

## 2020-01-05 NOTE — Chronic Care Management (AMB) (Signed)
Chronic Care Management    Clinical Social Work General Note  01/05/2020 Name: JONES VIVIANI MRN: [id number] DOB: Oct 20, 1980  DEEN DEGUIA is a 39 y.o. year old male who is a primary care patient of Iona Beard, MD. The CCM was consulted to assist the patient with [employer] .   Mr. Pastorino was given information about Chronic Care Management services today including:  1. CCM service includes personalized support from designated clinical staff supervised by his physician, including individualized plan of care and coordination with other care providers 2. 24/7 contact phone numbers for assistance for urgent and routine care needs. 3. Service will only be billed when office clinical staff spend 20 minutes or more in a month to coordinate care. 4. Only one practitioner may furnish and bill the service in a calendar month. 5. The patient may stop CCM services at any time (effective at the end of the month) by phone call to the office staff. 6. The patient will be responsible for cost sharing (co-pay) of up to 20% of the service fee (after annual deductible is met).  Patient agreed to services and verbal consent obtained.   Review of patient status, including review of consultants reports, relevant laboratory and other test results, and collaboration with appropriate care team members and the patient's provider was performed as part of comprehensive patient evaluation and provision of chronic care management services.    SDOH (Social Determinants of Health) assessments and interventions performed:  Yes    Outpatient Encounter Medications as of 01/05/2020  Medication Sig Note  . amLODipine-atorvastatin (CADUET) 10-40 MG tablet Take 1 tablet by mouth daily.   Marland Kitchen aspirin 81 MG chewable tablet Chew 1 tablet (81 mg total) by mouth daily. (Patient not taking: Reported on 02/12/2018)   . Capsaicin 0.035 % CREA Apply 1 application topically 2 (two) times daily as needed. (Patient not  taking: Reported on 12/03/2019)   . carvedilol (COREG) 6.25 MG tablet Take 0.5 tablets (3.125 mg total) by mouth 2 (two) times daily with a meal. (Patient taking differently: Take 3.125 mg by mouth 2 (two) times daily with a meal. )   . citalopram (CELEXA) 20 MG tablet Take 1 tablet (20 mg total) by mouth daily. (Patient not taking: Reported on 12/15/2019) 12/15/2019: States he can't afford the copay  . diclofenac Sodium (VOLTAREN) 1 % GEL Apply 4 g topically 4 (four) times daily. (Patient not taking: Reported on 12/15/2019)   . emtricitabine-tenofovir AF (DESCOVY) 200-25 MG tablet Take 1 tablet by mouth daily. (Patient not taking: Reported on 12/15/2019) 12/15/2019: Says he can't afford the copay  . fluticasone (FLONASE) 50 MCG/ACT nasal spray PLACE 1 SPRAY INTO BOTH NOSTRILS DAILY.   Marland Kitchen gabapentin (NEURONTIN) 300 MG capsule Take 1 capsule (300 mg total) by mouth daily as needed.   . loratadine (CLARITIN) 10 MG tablet TAKE 1 TABLET BY MOUTH EVERY DAY   . losartan-hydrochlorothiazide (HYZAAR) 100-25 MG tablet Take 1 tablet by mouth daily. (Patient taking differently: Take 1 tablet by mouth daily. )   . nicotine (NICODERM CQ - DOSED IN MG/24 HOURS) 14 mg/24hr patch Place 1 patch (14 mg total) onto the skin daily as needed (tobacco craving). (Patient not taking: Reported on 12/15/2019)   . olopatadine (PATANOL) 0.1 % ophthalmic solution Place 1 drop into both eyes 2 (two) times daily. (Patient not taking: Reported on 12/03/2019)   . senna (SENOKOT) 8.6 MG TABS tablet Take 1 tablet (8.6 mg total) by mouth daily as needed for  mild constipation. (Patient not taking: Reported on 12/15/2019)   . varenicline (CHANTIX STARTING MONTH PAK) 0.5 MG X 11 & 1 MG X 42 tablet Take one 0.5 mg tablet by mouth once daily for 3 days, then increase to one 0.5 mg tablet twice daily for 4 days, then increase to one 1 mg tablet twice daily. (Patient not taking: Reported on 12/03/2019)   . [DISCONTINUED] loratadine (CLARITIN) 10 MG tablet  TAKE 1 TABLET BY MOUTH EVERY DAY    No facility-administered encounter medications on file as of 01/05/2020.    Goals Addressed   None        Contacted patient today to complete Social Work Assessment/SDOH screening.   Patient acknowledges financial strain mostly related to cost of medications   Patient states that he has [place] Medicaid which covers cost of Medicare premiums but does not assist with cost of medications. Patient receives approximately $15/month in food stamps. Patient states that it has been quite some time since he applied.  Informed him that Medicaid and Food Stamp eligibility may have changed IF he has had a change in income since applying.  Informed him that he can discuss this with his Medicaid caseworker.    Patient states that he recently moved and money was tighter during that transition.  Says that things are starting to settle.   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   Patient states that his main concern today is getting a better understanding of what medications he is supposed to be taking and any medication assistance that may be available.    No specific SW goals were identified today. Patient provided with my contact information and encouraged to call if resource needs arise.     Follow Up Plan: Requested follow up by [facility], Kelli Churn regarding medication reconciliation.            Ronn Melena, [place] Coordination Social Worker [place] [phone]

## 2020-01-06 ENCOUNTER — Ambulatory Visit: Payer: Self-pay | Admitting: *Deleted

## 2020-01-06 ENCOUNTER — Telehealth: Payer: Medicare HMO

## 2020-01-06 NOTE — Chronic Care Management (AMB) (Signed)
  Chronic Care Management   Outreach Note  01/06/2020 Name: Samuel Decker MRN: [id number] DOB: 1981-05-23  Referred by: Quincy Simmonds, MD Reason for referral : Chronic Care Management (HTN, Asthma, HLD)   An unsuccessful telephone outreach was attempted today; unable to leave message on contact number as recording stated voice mail box was full. The patient was referred to the case management team for assistance with care management and care coordination. Purpose of today's call was to determine if patient had completed patient assistance application for Descovy that was mailed to patient by pharmacy technician Mardee Postin on 12/16/19 and to determine what other medication assistance patient is requesting as he indicated during his phone call with Amber Chrismon BW on 01/05/20.   Follow Up Plan: The care management team will reach out to the patient again over the next 7-14 days.   Cranford Mon RN, CCM, CDCES CCM Clinic RN Care Manager [phone]

## 2020-01-15 ENCOUNTER — Telehealth: Payer: Medicare HMO

## 2020-01-15 ENCOUNTER — Ambulatory Visit: Payer: Self-pay | Admitting: *Deleted

## 2020-01-15 ENCOUNTER — Other Ambulatory Visit (HOSPITAL_COMMUNITY): Payer: Medicare HMO

## 2020-01-15 NOTE — Chronic Care Management (AMB) (Signed)
  Chronic Care Management   Outreach Note  01/15/2020 Name: Samuel Decker MRN: [id number] DOB: [birth year]  Referred by: Quincy Simmonds, MD Reason for referral : Chronic Care Management (HTN, HLD )   A second unsuccessful telephone outreach was attempted today. The patient was referred to the case management team for assistance with care management and care coordination.   Follow Up Plan: A HIPPA compliant phone message was left for the patient providing contact information and requesting a return call.  The care management team will reach out to the patient again over the next 7-14 days. Advised him on message the call was about assisting him with medication copays.   Cranford Mon RN, CCM, CDCES CCM Clinic RN Care Manager [phone]

## 2020-01-17 ENCOUNTER — Encounter (HOSPITAL_BASED_OUTPATIENT_CLINIC_OR_DEPARTMENT_OTHER): Payer: Medicare HMO | Admitting: Internal Medicine

## 2020-01-22 ENCOUNTER — Telehealth: Payer: Medicare HMO

## 2020-01-25 ENCOUNTER — Ambulatory Visit: Payer: Medicare HMO | Attending: Internal Medicine | Admitting: Physical Therapy

## 2020-01-25 ENCOUNTER — Telehealth: Payer: Self-pay | Admitting: Physical Therapy

## 2020-01-25 NOTE — Telephone Encounter (Signed)
LVM regarding missed appointment today, and that it was his last scheduled appointment. I noted if he would like to continue with physical therapy we can reschedule him, if he feels he no longer needs physical therapy we could discharge him, Either way to call us and let us know.    Kanylah Muench PT, DPT, LAT, ATC  01/25/20  9:13 AM

## 2020-01-28 ENCOUNTER — Ambulatory Visit: Payer: Medicare HMO | Admitting: *Deleted

## 2020-01-28 DIAGNOSIS — E7800 Pure hypercholesterolemia, unspecified: Secondary | ICD-10-CM

## 2020-01-28 DIAGNOSIS — I10 Essential (primary) hypertension: Secondary | ICD-10-CM

## 2020-01-28 NOTE — Patient Instructions (Signed)
Visit Information Please call the clinic to make a follow up appointment.   Goals Addressed              This Visit's Progress     Patient Stated   .  " My blood pressure is high because I need help in dealing with stress and sometimes I can't afford my medications." (pt-stated)        CARE PLAN ENTRY (see longitudinal plan of care for additional care plan information)  Objective:  . Last practice recorded BP readings:  BP Readings from Last 3 Encounters:  12/08/19 (!) 169/115  11/23/19 (!) 185/123  08/24/19 (!) 184/98 .   Marland Kitchen Most recent eGFR/CrCl: No results found for: EGFR  No components found for: CRCL  Current Barriers: successful outreach to patient to follow up on his medication assistance concerns, when asked about  status of completing the patient assistance paperwork for Descovy that was mailed to him by Cheryle Horsfall , pharmacy technician, he says he never received it , he asked what doctor has been assigned to him since Dr Maricela Bo is no longer with the clinic.   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 Over the next 30 days, patient will attend all scheduled medical appointments: . Over the next 30 days, patient will demonstrate improved adherence to prescribed treatment plan for hypertension as evidenced by taking all medications as prescribed, and adhering to low sodium/DASH diet  Interventions:  . Evaluation of current treatment plan related to hypertension self management and patient's adherence to plan as established by provider. . Provided opportunity for questions related to HTN and HLD self management  . Discussed plans with patient for ongoing care management follow up and provided patient with direct contact information for care management team . Advised him of the name of his assigned  clinic primary care provider  . Suggested he make a follow up clinic appointment  Patient Self Care Activities:  . UNABLE to independently control blood pressure to meet targets  Please see past updates related to this goal by clicking on the "Past Updates" button in the selected goal          The patient verbalized understanding of instructions provided today and declined a print copy of patient instruction materials.   The care management team will reach out to the patient again over the next 30-60 days.   Kelli Churn RN, CCM, [facility] RN Care Manager [phone]

## 2020-01-28 NOTE — Chronic Care Management (AMB) (Signed)
Chronic Care Management   Follow Up Note   01/28/2020 Name: Samuel Decker MRN: [id number] DOB: 04/30/1981  Referred by: Iona Beard, MD Reason for referral : Chronic Care Management (HTN, HLD)   Samuel Decker is a 39 y.o. year old male who is a primary care patient of Iona Beard, MD. The CCM team was consulted for assistance with chronic disease management and care coordination needs.    Review of patient status, including review of consultants reports, relevant laboratory and other test results, and collaboration with appropriate care team members and the patient's provider was performed as part of comprehensive patient evaluation and provision of chronic care management services.    SDOH (Social Determinants of Health) assessments performed: No See Care Plan activities for detailed interventions related to [facility])     Outpatient Encounter Medications as of 01/28/2020  Medication Sig Note  . amLODipine-atorvastatin (CADUET) 10-40 MG tablet Take 1 tablet by mouth daily.   Marland Kitchen aspirin 81 MG chewable tablet Chew 1 tablet (81 mg total) by mouth daily. (Patient not taking: Reported on 02/12/2018)   . Capsaicin 0.035 % CREA Apply 1 application topically 2 (two) times daily as needed. (Patient not taking: Reported on 12/03/2019)   . carvedilol (COREG) 6.25 MG tablet Take 0.5 tablets (3.125 mg total) by mouth 2 (two) times daily with a meal. (Patient taking differently: Take 3.125 mg by mouth 2 (two) times daily with a meal. )   . citalopram (CELEXA) 20 MG tablet Take 1 tablet (20 mg total) by mouth daily. (Patient not taking: Reported on 12/15/2019) 12/15/2019: States he can't afford the copay  . diclofenac Sodium (VOLTAREN) 1 % GEL Apply 4 g topically 4 (four) times daily. (Patient not taking: Reported on 12/15/2019)   . fluticasone (FLONASE) 50 MCG/ACT nasal spray PLACE 1 SPRAY INTO BOTH NOSTRILS DAILY.   Marland Kitchen gabapentin (NEURONTIN) 300 MG capsule Take 1 capsule (300 mg total) by mouth daily  as needed.   . loratadine (CLARITIN) 10 MG tablet TAKE 1 TABLET BY MOUTH EVERY DAY   . losartan-hydrochlorothiazide (HYZAAR) 100-25 MG tablet Take 1 tablet by mouth daily. (Patient taking differently: Take 1 tablet by mouth daily. )   . nicotine (NICODERM CQ - DOSED IN MG/24 HOURS) 14 mg/24hr patch Place 1 patch (14 mg total) onto the skin daily as needed (tobacco craving). (Patient not taking: Reported on 12/15/2019)   . olopatadine (PATANOL) 0.1 % ophthalmic solution Place 1 drop into both eyes 2 (two) times daily. (Patient not taking: Reported on 12/03/2019)   . senna (SENOKOT) 8.6 MG TABS tablet Take 1 tablet (8.6 mg total) by mouth daily as needed for mild constipation. (Patient not taking: Reported on 12/15/2019)   . varenicline (CHANTIX STARTING MONTH PAK) 0.5 MG X 11 & 1 MG X 42 tablet Take one 0.5 mg tablet by mouth once daily for 3 days, then increase to one 0.5 mg tablet twice daily for 4 days, then increase to one 1 mg tablet twice daily. (Patient not taking: Reported on 12/03/2019)   . [DISCONTINUED] loratadine (CLARITIN) 10 MG tablet TAKE 1 TABLET BY MOUTH EVERY DAY    No facility-administered encounter medications on file as of 01/28/2020.     Objective:  Wt Readings from Last 3 Encounters:  12/08/19 (!) 325 lb 6.4 oz (147.6 kg)  11/23/19 (!) 329 lb 3.2 oz (149.3 kg)  08/24/19 (!) 317 lb 8 oz (144 kg)   Lab Results  Component Value Date   CHOL 200 02/12/2018  HDL 32 (L) 02/12/2018   LDLCALC 130 (H) 02/12/2018   TRIG 190 (H) 02/12/2018   CHOLHDL 6.3 02/12/2018  Suggest lipid profile update; on statin therapy  Goals Addressed              This Visit's Progress     Patient Stated   .  " My blood pressure is high because I need help in dealing with stress and sometimes I can't afford my medications." (pt-stated)        CARE PLAN ENTRY (see longitudinal plan of care for additional care plan information)  Objective:  . Last practice recorded BP readings:  BP Readings from  Last 3 Encounters:  12/08/19 (!) 169/115  11/23/19 (!) 185/123  08/24/19 (!) 184/98 .   Marland Kitchen Most recent eGFR/CrCl: No results found for: EGFR  No components found for: CRCL  Current Barriers: successful outreach to patient to follow up on his medication assistance concerns, when asked about  status of completing the patient assistance paperwork for [place] that was mailed to him by Cheryle Horsfall , pharmacy technician, he says he never received it , he asked what doctor has been assigned to him since Dr Maricela Bo is no longer with the clinic.   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 Over the next 30 days, patient will attend all scheduled medical appointments: . Over the next 30 days, patient will demonstrate improved adherence to prescribed treatment plan for hypertension as evidenced by taking all medications as prescribed, and adhering to low sodium/DASH diet  Interventions:  . Evaluation of current treatment plan related to hypertension self management and patient's adherence to plan as established by provider. . Provided opportunity for questions related to HTN and HLD self management  . Discussed plans with patient for ongoing care management follow up and provided patient with direct contact information for care management team . Advised him of the name of his assigned clinic primary care provider  . Suggested he make a follow up clinic appointment  Patient Self Care Activities:  . UNABLE to independently control blood pressure to meet targets  Please see past updates related to this goal by clicking on the "Past Updates" button in the selected goal           Plan:   The care management team will reach out to the patient again over the next 30-60 days.    Kelli Churn RN, CCM, [facility] RN Care  Manager [phone]

## 2020-01-29 NOTE — Progress Notes (Signed)
Internal Medicine Clinic Attending  CCM services provided by the care management provider and their documentation were discussed with Dr. Speakman. We reviewed the pertinent findings, urgent action items addressed by the resident and non-urgent items to be addressed by the PCP.  I agree with the assessment, diagnosis, and plan of care documented in the CCM and resident's note.  Merilynn Haydu Thomas Debbie Bellucci, MD 01/29/2020  

## 2020-01-29 NOTE — Progress Notes (Signed)
Internal Medicine Clinic Resident  I have personally reviewed this encounter including the documentation in this note and/or discussed this patient with the care management provider. I will address any urgent items identified by the care management provider and will communicate my actions to the patient's PCP. I have reviewed the patient's CCM visit with my supervising attending, Dr Vincent.  Dallas Torok, MD 01/29/2020   

## 2020-02-01 ENCOUNTER — Telehealth: Payer: Self-pay | Admitting: Student

## 2020-02-01 NOTE — Telephone Encounter (Signed)
   SF 02/01/2020   Name: Samuel Decker   MRN: [id number]   DOB: 07-Jun-1981   AGE: 39 y.o.   GENDER: male   PCP Quincy Simmonds, MD.   Called pt regarding Community Resource Referral for medication assistance and financial assistance. Patient stated that currently he would like to wait to receive medication assistance because he wants to meet with his new provider first so he can better understand what medications he is supposed to be taking. Informed Mr. Demarais that since he has a Medicare Advantage Plan he could possibly be eligible for Medicare Extra Help. Patient will need to call the [facility] Coordinator for [facility], which is located at [employer] at [phone], ext [phone]. Patient also stated that if there are any resources available for financial assistance to assist with utilities and rent he will be willing to accept that information as well. Informed patient that Care Guide will search resources and give him a call back with the information.   [facility] Care Guide, Embedded Care Coordination [facility], Care Management Phone: [phone] Email: [email or web address]

## 2020-02-02 NOTE — Telephone Encounter (Signed)
Attempted to call patient regarding referral, but patient did not answer and could not leave a message due to voicemail box was full. Care Guide will try to contact patient again at a later time.

## 2020-02-05 ENCOUNTER — Ambulatory Visit (HOSPITAL_BASED_OUTPATIENT_CLINIC_OR_DEPARTMENT_OTHER): Payer: Medicare HMO | Admitting: Pulmonary Disease

## 2020-02-08 NOTE — Telephone Encounter (Signed)
   SF 02/08/2020    Name: Samuel Decker   MRN: [id number]   DOB: March 30, 1981   AGE: 39 y.o.   GENDER: male   PCP Quincy Simmonds, MD.   Called pt regarding Community Resource Referral for financial and medication assistance. Patient did not answer, voicemail box is full could not leave message. Per patient on 02/01/20, stated that he wanted to wait to contact Social Security Disability for Medicare Extra Help until after he has seen his new provider. Patient was also familiar with local organizations in the area that provide assistance with rent and utilities, but was willing to receive information from Care Guide.  Patient has no additional needs at this time.   Closing referral pending any other needs of patient.     [facility] Care Guide, Embedded Care Coordination [facility], Care Management Phone: [phone] Email: [email or web address]

## 2020-02-26 ENCOUNTER — Ambulatory Visit: Payer: Medicare HMO | Admitting: *Deleted

## 2020-02-26 DIAGNOSIS — E7800 Pure hypercholesterolemia, unspecified: Secondary | ICD-10-CM

## 2020-02-26 DIAGNOSIS — I10 Essential (primary) hypertension: Secondary | ICD-10-CM

## 2020-02-26 NOTE — Chronic Care Management (AMB) (Signed)
Chronic Care Management   Follow Up Note   02/26/2020 Name: Samuel Decker MRN: [id number] DOB: 02/25/1981  Referred by: Iona Beard, MD Reason for referral : Chronic Care Management (HTN, HLD)   Samuel Decker is a 39 y.o. year old male who is a primary care patient of Iona Beard, MD. The CCM team was consulted for assistance with chronic disease management and care coordination needs.    Review of patient status, including review of consultants reports, relevant laboratory and other test results, and collaboration with appropriate care team members and the patient's provider was performed as part of comprehensive patient evaluation and provision of chronic care management services.    SDOH (Social Determinants of Health) assessments performed: No See Care Plan activities for detailed interventions related to [facility])     Outpatient Encounter Medications as of 02/26/2020  Medication Sig Note  . amLODipine-atorvastatin (CADUET) 10-40 MG tablet Take 1 tablet by mouth daily.   Marland Kitchen aspirin 81 MG chewable tablet Chew 1 tablet (81 mg total) by mouth daily. (Patient not taking: Reported on 02/12/2018)   . Capsaicin 0.035 % CREA Apply 1 application topically 2 (two) times daily as needed. (Patient not taking: Reported on 12/03/2019)   . carvedilol (COREG) 6.25 MG tablet Take 0.5 tablets (3.125 mg total) by mouth 2 (two) times daily with a meal. (Patient taking differently: Take 3.125 mg by mouth 2 (two) times daily with a meal. )   . citalopram (CELEXA) 20 MG tablet Take 1 tablet (20 mg total) by mouth daily. (Patient not taking: Reported on 12/15/2019) 12/15/2019: States he can't afford the copay  . diclofenac Sodium (VOLTAREN) 1 % GEL Apply 4 g topically 4 (four) times daily. (Patient not taking: Reported on 12/15/2019)   . fluticasone (FLONASE) 50 MCG/ACT nasal spray PLACE 1 SPRAY INTO BOTH NOSTRILS DAILY.   Marland Kitchen gabapentin (NEURONTIN) 300 MG capsule Take 1 capsule (300 mg total) by mouth daily  as needed.   . loratadine (CLARITIN) 10 MG tablet TAKE 1 TABLET BY MOUTH EVERY DAY   . losartan-hydrochlorothiazide (HYZAAR) 100-25 MG tablet Take 1 tablet by mouth daily. (Patient taking differently: Take 1 tablet by mouth daily. )   . nicotine (NICODERM CQ - DOSED IN MG/24 HOURS) 14 mg/24hr patch Place 1 patch (14 mg total) onto the skin daily as needed (tobacco craving). (Patient not taking: Reported on 12/15/2019)   . senna (SENOKOT) 8.6 MG TABS tablet Take 1 tablet (8.6 mg total) by mouth daily as needed for mild constipation. (Patient not taking: Reported on 12/15/2019)   . varenicline (CHANTIX STARTING MONTH PAK) 0.5 MG X 11 & 1 MG X 42 tablet Take one 0.5 mg tablet by mouth once daily for 3 days, then increase to one 0.5 mg tablet twice daily for 4 days, then increase to one 1 mg tablet twice daily. (Patient not taking: Reported on 12/03/2019)   . [DISCONTINUED] loratadine (CLARITIN) 10 MG tablet TAKE 1 TABLET BY MOUTH EVERY DAY    No facility-administered encounter medications on file as of 02/26/2020.     Objective:  BP Readings from Last 3 Encounters:  12/08/19 (!) 169/115  11/23/19 (!) 185/123  08/24/19 (!) 184/98   Wt Readings from Last 3 Encounters:  12/08/19 (!) 325 lb 6.4 oz (147.6 kg)  11/23/19 (!) 329 lb 3.2 oz (149.3 kg)  08/24/19 (!) 317 lb 8 oz (144 kg)   Lab Results  Component Value Date   CHOL 200 02/12/2018   HDL 32 (L)  02/12/2018   LDLCALC 130 (H) 02/12/2018   TRIG 190 (H) 02/12/2018   CHOLHDL 6.3 02/12/2018  Please update lipid panel at next clinic visit  Goals Addressed              This Visit's Progress     Patient Stated   .  " My blood pressure is high because I need help in dealing with stress and sometimes I can't afford my medications." (pt-stated)        CARE PLAN ENTRY (see longitudinal plan of care for additional care plan information)  Objective:  . Last practice recorded BP readings:  BP Readings from Last 3 Encounters:  12/08/19 (!)  169/115  11/23/19 (!) 185/123  08/24/19 (!) 184/98 .   Marland Kitchen Most recent eGFR/CrCl: No results found for: EGFR  No components found for: CRCL  Current Barriers: successful outreach to patient, he voices no concerns, states that he wants to wait to contact [place] for Medicare Extra Help until after he has seen his new provider. 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 Over the next 30  60 days, patient will attend all scheduled medical appointments: . Over the next 30 - 60 days, patient will demonstrate improved adherence to prescribed treatment plan for hypertension as evidenced by taking all medications as prescribed, and adhering to low sodium/DASH diet  Interventions:  . Evaluation of current treatment plan related to hypertension self management and patient's adherence to plan as established by provider. . Provided opportunity for questions related to HTN and HLD self management  . Discussed plans with patient for ongoing care management follow up and provided patient with direct contact information for care management team . Suggested he make a follow up clinic appointment  Patient Self Care Activities:  . UNABLE to independently control blood pressure to meet targets  Please see past updates related to this goal by clicking on the "Past Updates" button in the selected goal           Plan:   The care management team will reach out to the patient again over the next 30-60 days.   Kelli Churn RN, CCM, [facility] RN Care Manager [phone]

## 2020-02-26 NOTE — Patient Instructions (Signed)
Visit Information It was nice speaking with you today. Goals Addressed              This Visit's Progress     Patient Stated   .  " My blood pressure is high because I need help in dealing with stress and sometimes I can't afford my medications." (pt-stated)        CARE PLAN ENTRY (see longitudinal plan of care for additional care plan information)  Objective:  . Last practice recorded BP readings:  BP Readings from Last 3 Encounters:  12/08/19 (!) 169/115  11/23/19 (!) 185/123  08/24/19 (!) 184/98 .   Marland Kitchen Most recent eGFR/CrCl: No results found for: EGFR  No components found for: CRCL  Current Barriers: successful outreach to patient, he voices no concerns, states that he wants to wait to contact [place] for Medicare Extra Help until after he has seen his new provider. 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 Over the next 30  60 days, patient will attend all scheduled medical appointments: . Over the next 30 - 60 days, patient will demonstrate improved adherence to prescribed treatment plan for hypertension as evidenced by taking all medications as prescribed, and adhering to low sodium/DASH diet  Interventions:  . Evaluation of current treatment plan related to hypertension self management and patient's adherence to plan as established by provider. . Provided opportunity for questions related to HTN and HLD self management  . Discussed plans with patient for ongoing care management follow up and provided patient with direct contact information for care management team . Suggested he make a follow up clinic appointment  Patient Self Care Activities:  . UNABLE to  independently control blood pressure to meet targets  Please see past updates related to this goal by clicking on the "Past Updates" button in the selected goal          The patient verbalized understanding of instructions provided today and declined a print copy of patient instruction materials.   The care management team will reach out to the patient again over the next 30-60 days.   Kelli Churn RN, CCM, [facility] RN Care Manager [phone]

## 2020-02-29 NOTE — Progress Notes (Signed)
Internal Medicine Clinic Resident  I have personally reviewed this encounter including the documentation in this note and/or discussed this patient with the care management provider. I will address any urgent items identified by the care management provider and will communicate my actions to the patient's PCP. I have reviewed the patient's CCM visit with my supervising attending, Dr Mikey Bussing.  Agree with patient scheduling a OV.   Verdene Lennert, MD 02/29/2020

## 2020-03-28 ENCOUNTER — Ambulatory Visit: Payer: Medicare HMO | Admitting: *Deleted

## 2020-03-28 DIAGNOSIS — E7800 Pure hypercholesterolemia, unspecified: Secondary | ICD-10-CM

## 2020-03-28 DIAGNOSIS — I10 Essential (primary) hypertension: Secondary | ICD-10-CM

## 2020-03-28 NOTE — Patient Instructions (Signed)
Visit Information It was nice speaking with you today. Please complete the sleep study and call the clinic and make a follow up appointment.  Goals Addressed              This Visit's Progress     Patient Stated   .  " My blood pressure is high because I need help in dealing with stress and sometimes I can't afford my medications." (pt-stated)        CARE PLAN ENTRY (see longitudinal plan of care for additional care plan information)  Objective:  . Last practice recorded BP readings:  BP Readings from Last 3 Encounters:  12/08/19 (!) 169/115  11/23/19 (!) 185/123  08/24/19 (!) 184/98 .   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 Over the next 30  60 days, patient will attend all scheduled medical appointments: . Over the next 30 - 60 days, patient will demonstrate improved adherence to prescribed treatment plan for hypertension as evidenced by taking all medications as prescribed, and adhering to low sodium/DASH diet  Interventions:  . Evaluation of current treatment plan related to hypertension self management and patient's adherence to plan as established by provider. . Provided opportunity for questions related to HTN and HLD self management  . Encouraged him to call customer service at [facility] to see if he has a benefit to cover a large cuff for his home monitor . Offered to help with preauthorization of the large blood  pressure cuff if it is needed . Discussed positive effects on blood pressure when sleep apnea is treated. Encouraged him to complete the sleep study.  . Discussed plans with patient for ongoing care management follow up and provided patient with direct contact information for care management team . Suggested he make a follow up clinic appointment  Patient Self Care Activities:  . UNABLE to independently control blood pressure to meet targets  Please see past updates related to this goal by clicking on the "Past Updates" button in the selected goal          The patient verbalized understanding of instructions provided today and declined a print copy of patient instruction materials.   The care management team will reach out to the patient again over the next 30-60 days.   Kelli Churn RN, CCM, [facility] RN Care Manager [phone]

## 2020-03-28 NOTE — Chronic Care Management (AMB) (Signed)
Chronic Care Management   Follow Up Note   03/28/2020 Name: Samuel Decker MRN: [id number] DOB: [birth year]  Referred by: Iona Beard, MD Reason for referral : Chronic Care Management (HTN, HLD, prediabetes, asthma)   Samuel Decker is a 39 y.o. year old male who is a primary care patient of Iona Beard, MD. The CCM team was consulted for assistance with chronic disease management and care coordination needs.    Review of patient status, including review of consultants reports, relevant laboratory and other test results, and collaboration with appropriate care team members and the patient's provider was performed as part of comprehensive patient evaluation and provision of chronic care management services.    SDOH (Social Determinants of Health) assessments performed: No See Care Plan activities for detailed interventions related to [facility])     Outpatient Encounter Medications as of 03/28/2020  Medication Sig Note  . aspirin 81 MG chewable tablet Chew 1 tablet (81 mg total) by mouth daily. (Patient not taking: Reported on 02/12/2018)   . Capsaicin 0.035 % CREA Apply 1 application topically 2 (two) times daily as needed. (Patient not taking: Reported on 12/03/2019)   . carvedilol (COREG) 6.25 MG tablet Take 0.5 tablets (3.125 mg total) by mouth 2 (two) times daily with a meal. (Patient taking differently: Take 3.125 mg by mouth 2 (two) times daily with a meal. )   . citalopram (CELEXA) 20 MG tablet Take 1 tablet (20 mg total) by mouth daily. (Patient not taking: Reported on 12/15/2019) 12/15/2019: States he can't afford the copay  . diclofenac Sodium (VOLTAREN) 1 % GEL Apply 4 g topically 4 (four) times daily. (Patient not taking: Reported on 12/15/2019)   . fluticasone (FLONASE) 50 MCG/ACT nasal spray PLACE 1 SPRAY INTO BOTH NOSTRILS DAILY.   Marland Kitchen gabapentin (NEURONTIN) 300 MG capsule Take 1 capsule (300 mg total) by mouth daily as needed.   . loratadine (CLARITIN) 10 MG tablet TAKE 1 TABLET  BY MOUTH EVERY DAY   . losartan-hydrochlorothiazide (HYZAAR) 100-25 MG tablet Take 1 tablet by mouth daily. (Patient taking differently: Take 1 tablet by mouth daily. )   . nicotine (NICODERM CQ - DOSED IN MG/24 HOURS) 14 mg/24hr patch Place 1 patch (14 mg total) onto the skin daily as needed (tobacco craving). (Patient not taking: Reported on 12/15/2019)   . senna (SENOKOT) 8.6 MG TABS tablet Take 1 tablet (8.6 mg total) by mouth daily as needed for mild constipation. (Patient not taking: Reported on 12/15/2019)   . varenicline (CHANTIX STARTING MONTH PAK) 0.5 MG X 11 & 1 MG X 42 tablet Take one 0.5 mg tablet by mouth once daily for 3 days, then increase to one 0.5 mg tablet twice daily for 4 days, then increase to one 1 mg tablet twice daily. (Patient not taking: Reported on 12/03/2019)   . [DISCONTINUED] loratadine (CLARITIN) 10 MG tablet TAKE 1 TABLET BY MOUTH EVERY DAY    No facility-administered encounter medications on file as of 03/28/2020.     Objective:  Wt Readings from Last 3 Encounters:  12/08/19 (!) 325 lb 6.4 oz (147.6 kg)  11/23/19 (!) 329 lb 3.2 oz (149.3 kg)  08/24/19 (!) 317 lb 8 oz (144 kg)    Goals Addressed              This Visit's Progress     Patient Stated   .  " My blood pressure is high because I need help in dealing with stress and sometimes I can't afford  my medications." (pt-stated)        CARE PLAN ENTRY (see longitudinal plan of care for additional care plan information)  Objective:  . Last practice recorded BP readings:  BP Readings from Last 3 Encounters:  12/08/19 (!) 169/115  11/23/19 (!) 185/123  08/24/19 (!) 184/98 .   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 Over the next 30  60 days, patient will attend all scheduled medical appointments: . Over the next 30 - 60 days, patient will demonstrate improved adherence to prescribed treatment plan for hypertension as evidenced by taking all medications as prescribed, and adhering to low sodium/DASH diet  Interventions:  . Evaluation of current treatment plan related to hypertension self management and patient's adherence to plan as established by provider. . Provided opportunity for questions related to HTN and HLD self management  . Encouraged him to call customer service at [facility] to see if he has a benefit to cover a large cuff for his home monitor . Offered to help with preauthorization of the large blood pressure cuff  . Discussed positive effects on blood pressure when sleep apnea is treated. Encouraged him to complete the sleep study.  . Discussed plans with patient for ongoing care management follow up and provided patient with direct contact information for care management team . Suggested he make a follow up clinic appointment  Patient Self Care Activities:  . UNABLE to independently control blood pressure to meet targets  Please see past updates related to this goal by clicking on the "Past Updates" button in the selected goal           Plan:   The care management team will reach out to the patient again over the next 30-60 days.    Kelli Churn RN, CCM, [facility] RN Care Manager [phone]

## 2020-03-28 NOTE — Progress Notes (Addendum)
Internal Medicine Clinic Resident  I have personally reviewed this encounter including the documentation in this note and/or discussed this patient with the care management provider. I will address any urgent items identified by the care management provider and will communicate my actions to the patient's PCP. I have reviewed the patient's CCM visit with my supervising attending, Dr Williams.  Samuel Shafer K Aubryn Spinola, MD 03/28/2020     Internal Medicine Attending: I reviewed case and documentation and agree.  

## 2020-04-26 ENCOUNTER — Telehealth: Payer: Medicare HMO

## 2020-04-26 ENCOUNTER — Telehealth: Payer: Self-pay | Admitting: *Deleted

## 2020-04-26 NOTE — Telephone Encounter (Signed)
  Chronic Care Management   Outreach Note  04/26/2020 Name: Samuel Decker MRN: [id number] DOB: 07-17-1980  Referred by: Quincy Simmonds, MD Reason for referral : Chronic Care Management ( HTN, HLD, prediabetes, asthma)   An unsuccessful telephone outreach was attempted today. The patient was referred to the case management team for assistance with care management and care coordination. Unable to leave message as recording states mailbox is full.   Follow Up Plan: The care management team will reach out to the patient again over the next 7-10 days.   Cranford Mon RN, CCM, [facility] Clinic RN Care Manager [phone]

## 2020-05-03 ENCOUNTER — Telehealth: Payer: Medicare HMO

## 2020-05-03 ENCOUNTER — Telehealth: Payer: Self-pay | Admitting: *Deleted

## 2020-05-03 NOTE — Telephone Encounter (Signed)
°  Chronic Care Management   Outreach Note  05/03/2020 Name: Samuel Decker MRN: [id number] DOB: 01-Jul-1981  Referred by: Quincy Simmonds, MD Reason for referral : Chronic Care Management (HTN, HLD, prediabetes, asthma)   A second unsuccessful telephone outreach was attempted today. Unable to leave message as recording states voice mail box is full and cannot accept messages. The patient was referred to the case management team for assistance with care management and care coordination.   Follow Up Plan: The care management team will reach out to the patient again over the next 7-10 days.   Cranford Mon RN, CCM, [facility] Clinic RN Care Manager [phone]

## 2020-05-11 ENCOUNTER — Ambulatory Visit: Payer: Self-pay | Admitting: *Deleted

## 2020-05-11 ENCOUNTER — Telehealth: Payer: Medicare HMO

## 2020-05-11 DIAGNOSIS — I10 Essential (primary) hypertension: Secondary | ICD-10-CM

## 2020-05-11 DIAGNOSIS — Z87898 Personal history of other specified conditions: Secondary | ICD-10-CM

## 2020-05-11 DIAGNOSIS — E7800 Pure hypercholesterolemia, unspecified: Secondary | ICD-10-CM

## 2020-05-11 NOTE — Chronic Care Management (AMB) (Signed)
  Chronic Care Management   Outreach Note  05/11/2020 Name: Samuel Decker MRN: [id number] DOB: [birth year]  Referred by: Quincy Simmonds, MD Reason for referral : Chronic Care Management (HTN, HLD, prediabetes, asthma)   Third unsuccessful telephone outreach was attempted today. The patient was referred to the case management team for assistance with care management and care coordination. The patient's primary care provider has been notified of our unsuccessful attempts to make or maintain contact with the patient. The care management team is pleased to engage with this patient at any time in the future should he be interested in assistance from the care management team.   Follow Up Plan: No further follow up required: unable to [facility] contact with patient.  Cranford Mon RN, CCM, [facility] CCM Clinic RN Care Manager [phone]

## 2020-05-12 NOTE — Progress Notes (Signed)
Internal Medicine Clinic Attending  CCM services provided by the care management provider and their documentation were discussed with Dr. Steen. We reviewed the pertinent findings, urgent action items addressed by the resident and non-urgent items to be addressed by the PCP.  I agree with the assessment, diagnosis, and plan of care documented in the CCM and resident's note.  Gennavieve Huq, MD 05/12/2020 

## 2020-05-12 NOTE — Progress Notes (Signed)
Internal Medicine Clinic Resident  I have personally reviewed this encounter including the documentation in this note and/or discussed this patient with the care management provider. I will address any urgent items identified by the care management provider and will communicate my actions to the patient's PCP. I have reviewed the patient's CCM visit with my supervising attending, Dr Guilloud.  Jeffrey Dewana Ammirati, MD 05/12/2020    

## 2020-08-19 DIAGNOSIS — H52209 Unspecified astigmatism, unspecified eye: Secondary | ICD-10-CM | POA: Diagnosis not present

## 2020-08-19 DIAGNOSIS — H5213 Myopia, bilateral: Secondary | ICD-10-CM | POA: Diagnosis not present

## 2020-10-04 ENCOUNTER — Encounter: Payer: Self-pay | Admitting: *Deleted

## 2020-10-04 NOTE — Progress Notes (Unsigned)

## 2020-10-07 ENCOUNTER — Other Ambulatory Visit: Payer: Self-pay | Admitting: Student

## 2020-10-07 NOTE — Progress Notes (Signed)
Things That May Be Affecting Your Health:  Alcohol  Hearing loss  Pain    Depression  Home Safety  Sexual Health   Diabetes X Lack of physical activity  Stress   Difficulty with daily activities  Loneliness  Tiredness   Drug use  Medicines  Tobacco use   Falls  Motor Vehicle Safety X Weight  X Food choices  Oral Health  Other    YOUR PERSONALIZED HEALTH PLAN : 1. Schedule your next subsequent Medicare Wellness visit in one year 2. Attend all of your regular appointments to address your medical issues 3. Complete the preventative screenings and services   Annual Wellness Visit   Medicare Covered Preventative Screenings and Services  Services & Screenings Men and Women Who How Often Need? Date of Last Service Action  Abdominal Aortic Aneurysm Adults with AAA risk factors Once No     Alcohol Misuse and Counseling All Adults Screening once a year if no alcohol misuse. Counseling up to 4 face to face sessions.     Bone Density Measurement  Adults at risk for osteoporosis Once every 2 yrs      Lipid Panel Z13.6 All adults without CV disease Once every 5 yrs YES 02/12/2018     Colorectal Cancer   Stool sample or  Colonoscopy All adults 50 and older   Once every year  Every 10 years No     Under age 49  Depression All Adults Once a year YES Today   Diabetes Screening Blood glucose, post glucose load, or GTT Z13.1  All adults at risk  Pre-diabetics  Once per year  Twice per year YES  01/15/19 A1c was 5.4 Repeat A1C  Diabetes  Self-Management Training All adults Diabetics 10 hrs first year; 2 hours subsequent years. Requires Copay     Glaucoma  Diabetics  Family history of glaucoma  African Americans 50 yrs +  Hispanic Americans 65 yrs + Annually - requires coppay      Hepatitis C Z72.89 or F19.20  High Risk for HCV  Born between 1945 and 1965  Annually  Once No  08/24/19   HIV Z11.4 All adults based on risk  Annually btw ages 36 & 27 regardless of  risk  Annually > 65 yrs if at increased risk No  11/23/19   Lung Cancer Screening Asymptomatic adults aged 56-77 with 30 pack yr history and current smoker OR quit within the last 15 yrs Annually Must have counseling and shared decision making documentation before first screen      Medical Nutrition Therapy Adults with   Diabetes  Renal disease  Kidney transplant within past 3 yrs 3 hours first year; 2 hours subsequent years     Obesity and Counseling All adults Screening once a year Counseling if BMI 30 or higher YES Today   Tobacco Use Counseling Adults who use tobacco  Up to 8 visits in one year     Vaccines Z23  Hepatitis B  Influenza   Pneumonia  Adults   Once  Once every flu season  Two different vaccines separated by one year     Next Annual Wellness Visit People with Medicare Every year  Today     Services & Screenings Women Who How Often Need  Date of Last Service Action  Mammogram  Z12.31 Women over 40 One baseline ages 75-39. Annually ager 40 yrs+      Pap tests All women Annually if high risk. Every 2 yrs for normal risk  women      Screening for cervical cancer with   Pap (Z01.419 nl or Z01.411abnl) &  HPV Z11.51 Women aged 43 to 38 Once every 5 yrs     Screening pelvic and breast exams All women Annually if high risk. Every 2 yrs for normal risk women     Sexually Transmitted Diseases  Chlamydia  Gonorrhea  Syphilis All at risk adults Annually for non pregnant females at increased risk         Services & Screenings Men Who How Ofter Need  Date of Last Service Action  Prostate Cancer - DRE & PSA Men over 50 Annually.  DRE might require a copay. No       Sexually Transmitted Diseases  Syphilis All at risk adults Annually for men at increased risk No     Health Maintenance List Health Maintenance  Topic Date Due  . COVID-19 Vaccine (2 - Pfizer 3-dose series) 11/04/2019  . INFLUENZA VACCINE  01/30/2021  . TETANUS/TDAP  03/28/2028  .  Hepatitis C Screening  Completed  . HIV Screening  Completed  . HPV VACCINES  Aged Out

## 2020-10-19 ENCOUNTER — Ambulatory Visit (INDEPENDENT_AMBULATORY_CARE_PROVIDER_SITE_OTHER): Payer: Medicare HMO | Admitting: Student

## 2020-10-19 ENCOUNTER — Encounter: Payer: Self-pay | Admitting: Student

## 2020-10-19 ENCOUNTER — Other Ambulatory Visit (HOSPITAL_COMMUNITY)
Admission: RE | Admit: 2020-10-19 | Discharge: 2020-10-19 | Disposition: A | Discharge status: Home / Self Care | Payer: Medicare HMO | Source: Ambulatory Visit | Attending: Internal Medicine | Admitting: Internal Medicine

## 2020-10-19 VITALS — BP 218/133 | HR 83 | Temp 98.6°F | Wt 310.4 lb

## 2020-10-19 DIAGNOSIS — Z7251 High risk heterosexual behavior: Secondary | ICD-10-CM | POA: Diagnosis not present

## 2020-10-19 DIAGNOSIS — E782 Mixed hyperlipidemia: Secondary | ICD-10-CM

## 2020-10-19 DIAGNOSIS — I10 Essential (primary) hypertension: Secondary | ICD-10-CM | POA: Diagnosis not present

## 2020-10-19 DIAGNOSIS — E872 Acidosis: Secondary | ICD-10-CM

## 2020-10-19 DIAGNOSIS — E739 Lactose intolerance, unspecified: Secondary | ICD-10-CM

## 2020-10-19 DIAGNOSIS — E8729 Other acidosis: Secondary | ICD-10-CM

## 2020-10-19 DIAGNOSIS — Z87898 Personal history of other specified conditions: Secondary | ICD-10-CM

## 2020-10-19 DIAGNOSIS — Z7689 Persons encountering health services in other specified circumstances: Secondary | ICD-10-CM | POA: Diagnosis not present

## 2020-10-19 DIAGNOSIS — R69 Illness, unspecified: Secondary | ICD-10-CM | POA: Diagnosis not present

## 2020-10-19 DIAGNOSIS — A539 Syphilis, unspecified: Secondary | ICD-10-CM

## 2020-10-19 LAB — POCT GLYCOSYLATED HEMOGLOBIN (HGB A1C): Hemoglobin A1C: 5.4 % (ref 4.0–5.6)

## 2020-10-19 LAB — GLUCOSE, CAPILLARY: Glucose-Capillary: 92 mg/dL (ref 70–99)

## 2020-10-19 MED ORDER — AMLODIPINE BESYLATE 10 MG PO TABS
10.0000 mg | ORAL_TABLET | Freq: Every day | ORAL | 2 refills | Status: DC
Start: 2020-10-19 — End: 2020-10-20

## 2020-10-19 NOTE — Patient Instructions (Signed)
Thank you, [name] for allowing [place] to provide your care today. Today we discussed blood pressure, lactose intolerance and depression/Anxiety.    I have ordered the following labs for you:   Lab Orders     BMP8+Anion Gap     HIV antibody (with reflex)     RPR     Lipid Profile     POC Hbg A1C   I will call if any are abnormal. All of your labs can be accessed through "My Chart".   I have ordered the following tests: Urine cytology  I have ordered the following medication/changed the following medications:  1. Amlodipine 10 mg daily  Please follow-up in 1 week.  Should you have any questions or concerns please call the internal medicine clinic at [phone].    Samuel Ku, MD, MPH [facility]   My Chart Access: [email or web address]   If you have not already done so, please get your COVID 19 vaccine  To schedule an appointment for a COVID vaccine choice any of the following: Go to [email or web address]   Go to [email or web address]                  Call [phone]                                     Call [phone] and select Option 2

## 2020-10-19 NOTE — Progress Notes (Signed)
   CC: Follow up  HPI:  [name] is a 40 y.o. M with PMH as below who presents to clinic for follow up on his chronic medical problems. Please see problem based charting for evaluation, assessment and plan.  Past Medical History:  Diagnosis Date  . Asthma    Childhood, not active  . CHEST PAIN UNSPECIFIED 05/03/2010   Qualifier: Diagnosis of  By: Huntley Dec, Scott    . ELECTROCARDIOGRAM, ABNORMAL 05/03/2010   Qualifier: Diagnosis of  By: Huntley Dec, Scott    . Gout    Most recent flare 01/2018  . HYPERLIPIDEMIA 05/03/2010   Qualifier: Diagnosis of  By: Huntley Dec, Scott    . Hypertension   . Keratoconus of both eyes   . Legally blind     Review of Systems:  Constitutional: Negative for fever, dizziness or fatigue Eyes: Negative for visual changes Respiratory: Negative for shortness of breath Cardiac: Negative for chest pain Skin: Negative for rash GU: Negative for penile discharge or dysuria Neuro: Negative for headache or weakness  Physical Exam: General: Pleasant, welll-appearing obese male. NAD. HEENT: [place]/AT. EOMI.  Cardiac: RRR. No murmurs, rubs or gallops.  No LE edema Respiratory: Lungs CTAB. No wheezing or crackles. Abdominal: Soft, symmetric and non tender. Normal BS. Skin: Warm, dry and intact without rashes or lesions Extremities: Atraumatic. Full ROM. Pulse distal palpable. Neuro: A&O x 3. Moves all extremities. Normal Sensation. No focal deficits.  Psych: Appropriate mood and affect.  Vitals:   10/19/20 0935 10/19/20 0951  BP: (!) 215/127 (!) 218/133  Pulse: 86 83  Temp: 98.6 F (37 C)   TempSrc: Oral   SpO2: 99%   Weight: (!) 310 lb 6.4 oz (140.8 kg)     Assessment & Plan:   See Encounters Tab for problem based charting.  Patient discussed with Dr. Curt Bears, MD, MPH

## 2020-10-20 ENCOUNTER — Encounter: Payer: Self-pay | Admitting: Student

## 2020-10-20 DIAGNOSIS — E8729 Other acidosis: Secondary | ICD-10-CM | POA: Insufficient documentation

## 2020-10-20 DIAGNOSIS — A539 Syphilis, unspecified: Secondary | ICD-10-CM

## 2020-10-20 DIAGNOSIS — E872 Acidosis: Secondary | ICD-10-CM | POA: Insufficient documentation

## 2020-10-20 DIAGNOSIS — E739 Lactose intolerance, unspecified: Secondary | ICD-10-CM | POA: Insufficient documentation

## 2020-10-20 HISTORY — DX: Syphilis, unspecified: A53.9

## 2020-10-20 LAB — URINE CYTOLOGY ANCILLARY ONLY
Chlamydia: NEGATIVE
Comment: NEGATIVE
Comment: NEGATIVE
Comment: NORMAL
Neisseria Gonorrhea: NEGATIVE
Trichomonas: NEGATIVE

## 2020-10-20 MED ORDER — LOSARTAN POTASSIUM-HCTZ 100-25 MG PO TABS: 1.0000 | ORAL_TABLET | Freq: Every day | ORAL | 2 refills | Status: DC

## 2020-10-20 MED ORDER — AMLODIPINE BESYLATE 10 MG PO TABS: 10.0000 mg | ORAL_TABLET | Freq: Every day | ORAL | 2 refills | Status: DC

## 2020-10-20 MED ORDER — ATORVASTATIN CALCIUM 40 MG PO TABS: 40.0000 mg | ORAL_TABLET | Freq: Every day | ORAL | 2 refills | Status: DC

## 2020-10-20 NOTE — Assessment & Plan Note (Signed)
Lipid Panel     Component Value Date/Time   CHOL 217 (H) 10/19/2020 1103   TRIG 175 (H) 10/19/2020 1103   HDL 37 (L) 10/19/2020 1103   CHOLHDL 5.9 (H) 10/19/2020 1103   CHOLHDL 6.3 02/12/2018 1826   VLDL 38 02/12/2018 1826   LDLCALC 148 (H) 10/19/2020 1103   LABVLDL 32 10/19/2020 1103   Patient with a history of hyperlipidemia found to have elevated total cholesterol, triglycerides and LDL. Patient was previously on statin therapy but has not been adherent to medication since last year. Unable to calculate ASCVD score due to age however due to patient's risk factors such as smoking history and severe hypertension patient would likely benefit from moderate to high statin therapy.  Plan: --Start atorvastatin 40 mg daily --Encouraged to exercise daily and eat healthy to help with weight loss

## 2020-10-20 NOTE — Assessment & Plan Note (Signed)
Patient states previous provider discussed referring him to a sleep study. However, patient refused because he does not want to be on a CPAP. He plans to improve his symptoms with weight loss. Patient is currently down 15 pounds since June of last year. -- Continue to monitor and encourage weight loss.

## 2020-10-20 NOTE — Assessment & Plan Note (Signed)
Patient's weight is down 15 lbs since last office visit about a year ago. BMI is still elevated at 39.85. Patient's weight is a risk factor for his hypertension and likely sleep apnea. Patient states he is working on losing weight to help decrease his BP and avoid needing a CPAP.   Plan:  --Lifestyle modifications with exercise and a healthy diet.  --Continue to monitor weight.

## 2020-10-20 NOTE — Assessment & Plan Note (Signed)
Patient's most recent BMP showed bicarb of 17 and anion gap of 21 with normal creatinine and GFR.  Patient not in DKA, not uremic, not on metformin or isoniazid/iron, has not ingected any methanol or propylene glycol. Patient is prescribed aspirin but has not taken it in almost a year.  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 Tylenol use (pyroglutamic acid).  Plan to reassess at OV next week.   Plan: --UA and lactic acid at next office visit next week
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
## 2020-10-20 NOTE — Assessment & Plan Note (Signed)
Vitals:   10/19/20 0935 10/19/20 0951  BP: (!) 215/127 (!) 218/133   Patient with uncontrolled hypertension presents for follow up. Patient states he was placed on 4-5 medications for his BP last year but he did not like how they made him feel so he stopped taking them. States he was only taking "losartan" occasionally. He would like to get his BP under control but does not want to take too many medications. He denies any headaches, visual changes, dizziness, SOB or CP. Patient was counseled on the importance of good BP control to avoid macro-and micro-vascular complications later in life. Patient willing to start new antihypertension therapies. Plan to start on ARB but will check his kidney function before starting.   Plan:  --Start amldodipine 10 mg daily --Check BMP and start another antihypertension agent based on results --Advised to reduce salt-intake --Advised to check BP in the morning and evening then follow up in 1 week --Advised to continue lifestyle modifications to help with weight loss.
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
## 2020-10-20 NOTE — Assessment & Plan Note (Signed)
Patient's A1c remains normal at 5.4. Advised to continue to lose weight to help improve his other medical problems.  --Can consider a yearly A1c check due to his obesity.

## 2020-10-20 NOTE — Assessment & Plan Note (Signed)
Patient states he has noticed that every time he eats dairy products he has abdominal bloating and diarrhea. The past week he has had diarrhea and gas every time he eats cheese or ice cream. He denies any fever, nausea, vomiting or abdominal pain. Patient states most of his favorite snacks and desserts have dairy and so he is working on slowly cutting down on them.  Plan: -- Patient advised to avoid dairy products and monitor symptoms.

## 2020-10-21 LAB — HIV ANTIBODY (ROUTINE TESTING W REFLEX): HIV Screen 4th Generation wRfx: NONREACTIVE

## 2020-10-21 LAB — LIPID PANEL
Chol/HDL Ratio: 5.9 ratio — ABNORMAL HIGH (ref 0.0–5.0)
Cholesterol, Total: 217 mg/dL — ABNORMAL HIGH (ref 100–199)
HDL: 37 mg/dL — ABNORMAL LOW (ref >39)
LDL Chol Calc (NIH): 148 mg/dL — ABNORMAL HIGH (ref 0–99)
Triglycerides: 175 mg/dL — ABNORMAL HIGH (ref 0–149)
VLDL Cholesterol Cal: 32 mg/dL (ref 5–40)

## 2020-10-21 LAB — BMP8+ANION GAP
Anion Gap: 21 mmol/L — ABNORMAL HIGH (ref 10.0–18.0)
BUN/Creatinine Ratio: 9 (ref 9–20)
BUN: 10 mg/dL (ref 6–20)
CO2: 17 mmol/L — ABNORMAL LOW (ref 20–29)
Calcium: 9.5 mg/dL (ref 8.7–10.2)
Chloride: 103 mmol/L (ref 96–106)
Creatinine, Ser: 1.15 mg/dL (ref 0.76–1.27)
Glucose: 94 mg/dL (ref 65–99)
Potassium: 4.5 mmol/L (ref 3.5–5.2)
Sodium: 141 mmol/L (ref 134–144)
eGFR: 84 mL/min/{1.73_m2} (ref >59)

## 2020-10-21 LAB — RPR: RPR Ser Ql: REACTIVE — AB

## 2020-10-21 LAB — RPR, QUANT+TP ABS (REFLEX)
Rapid Plasma Reagin, Quant: 1:16 {titer} — ABNORMAL HIGH
T Pallidum Abs: REACTIVE — AB

## 2020-10-21 NOTE — Progress Notes (Signed)
Internal Medicine Clinic Attending  Case discussed with Dr. Amponsah  At the time of the visit.  We reviewed the resident's history and exam and pertinent patient test results.  I agree with the assessment, diagnosis, and plan of care documented in the resident's note.  

## 2020-10-26 ENCOUNTER — Other Ambulatory Visit: Payer: Self-pay

## 2020-10-26 ENCOUNTER — Ambulatory Visit (INDEPENDENT_AMBULATORY_CARE_PROVIDER_SITE_OTHER): Payer: Medicare HMO | Admitting: Student

## 2020-10-26 ENCOUNTER — Encounter: Payer: Self-pay | Admitting: Student

## 2020-10-26 DIAGNOSIS — M109 Gout, unspecified: Secondary | ICD-10-CM | POA: Diagnosis not present

## 2020-10-26 DIAGNOSIS — I10 Essential (primary) hypertension: Secondary | ICD-10-CM | POA: Diagnosis not present

## 2020-10-26 DIAGNOSIS — F32A Depression, unspecified: Secondary | ICD-10-CM

## 2020-10-26 DIAGNOSIS — M546 Pain in thoracic spine: Secondary | ICD-10-CM | POA: Diagnosis not present

## 2020-10-26 DIAGNOSIS — E8729 Other acidosis: Secondary | ICD-10-CM

## 2020-10-26 DIAGNOSIS — E872 Acidosis: Secondary | ICD-10-CM | POA: Diagnosis not present

## 2020-10-26 DIAGNOSIS — R69 Illness, unspecified: Secondary | ICD-10-CM | POA: Diagnosis not present

## 2020-10-26 DIAGNOSIS — F419 Anxiety disorder, unspecified: Secondary | ICD-10-CM

## 2020-10-26 MED ORDER — ASPIRIN 81 MG PO CHEW: 81.0000 mg | CHEWABLE_TABLET | Freq: Every day | ORAL | 1 refills | Status: DC

## 2020-10-26 MED ORDER — SPIRONOLACTONE 25 MG PO TABS: 25.0000 mg | ORAL_TABLET | Freq: Every day | ORAL | 2 refills | Status: DC

## 2020-10-26 NOTE — Patient Instructions (Addendum)
Thank you, Samuel Decker for allowing [place] to provide your care today. Today we discussed your blood pressure, weight, recent fall, anxiety and depression. You likely have a back sprain from your recent fall. This should get better on its own but call the office if symptoms worsens.    I have place a referrals to Medical weight loss management and counseling for depression/anxiety.   I have ordered the following medication/changed the following medications:  1. Start Spirolactone 25 mg daily 2. Start Aspirin 81 mg daily 3. You can take OTC Ibuprofen as needed for back pain.   Please follow-up in 2 weeks.   Should you have any questions or concerns please call the internal medicine clinic at [phone].    Sharrell Ku, MD, MPH [facility]   My Chart Access: [email or web address]   If you have not already done so, please get your COVID 19 vaccine  To schedule an appointment for a COVID vaccine choice any of the following: Go to [email or web address]   Go to [email or web address]                  Call [phone]                                     Call [phone] and select Option 2  Back Exercises These exercises help to make your trunk and back strong. They also help to keep the lower back flexible. Doing these exercises can help to prevent back pain or lessen existing pain.  If you have back pain, try to do these exercises 2-3 times each day or as told by your doctor.  As you get better, do the exercises once each day. Repeat the exercises more often as told by your doctor.  To stop back pain from coming back, do the exercises once each day, or as told by your doctor. Exercises Single knee to chest Do these steps 3-5 times in a row for each leg: 1. Lie on your back on a firm bed or the floor with your legs stretched out. 2. Bring one knee to your chest. 3. Grab your knee or thigh with both  hands and hold them it in place. 4. Pull on your knee until you feel a gentle stretch in your lower back or buttocks. 5. Keep doing the stretch for 10-30 seconds. 6. Slowly let go of your leg and straighten it. Pelvic tilt Do these steps 5-10 times in a row: 1. Lie on your back on a firm bed or the floor with your legs stretched out. 2. Bend your knees so they point up to the ceiling. Your feet should be flat on the floor. 3. Tighten your lower belly (abdomen) muscles to press your lower back against the floor. This will make your tailbone point up to the ceiling instead of pointing down to your feet or the floor. 4. Stay in this position for 5-10 seconds while you gently tighten your muscles and breathe evenly. Cat-cow Do these steps until your lower back bends more easily: 1. Get on your hands and knees on a firm surface. Keep your hands under your shoulders, and keep your knees under your hips. You may put padding under your knees. 2. Let your head hang down toward your chest. Tighten (contract) the muscles in your belly. Point your tailbone toward the floor so your lower back becomes rounded like the back of  a cat. 3. Stay in this position for 5 seconds. 4. Slowly lift your head. Let the muscles of your belly relax. Point your tailbone up toward the ceiling so your back forms a sagging arch like the back of a cow. 5. Stay in this position for 5 seconds.   Press-ups Do these steps 5-10 times in a row: 1. Lie on your belly (face-down) on the floor. 2. Place your hands near your head, about shoulder-width apart. 3. While you keep your back relaxed and keep your hips on the floor, slowly straighten your arms to raise the top half of your body and lift your shoulders. Do not use your back muscles. You may change where you place your hands in order to make yourself more comfortable. 4. Stay in this position for 5 seconds. 5. Slowly return to lying flat on the floor.   Bridges Do these steps 10  times in a row: 1. Lie on your back on a firm surface. 2. Bend your knees so they point up to the ceiling. Your feet should be flat on the floor. Your arms should be flat at your sides, next to your body. 3. Tighten your butt muscles and lift your butt off the floor until your waist is almost as high as your knees. If you do not feel the muscles working in your butt and the back of your thighs, slide your feet 1-2 inches farther away from your butt. 4. Stay in this position for 3-5 seconds. 5. Slowly lower your butt to the floor, and let your butt muscles relax. If this exercise is too easy, try doing it with your arms crossed over your chest.   Belly crunches Do these steps 5-10 times in a row: 1. Lie on your back on a firm bed or the floor with your legs stretched out. 2. Bend your knees so they point up to the ceiling. Your feet should be flat on the floor. 3. Cross your arms over your chest. 4. Tip your chin a little bit toward your chest but do not bend your neck. 5. Tighten your belly muscles and slowly raise your chest just enough to lift your shoulder blades a tiny bit off of the floor. Avoid raising your body higher than that, because it can put too much stress on your low back. 6. Slowly lower your chest and your head to the floor. Back lifts Do these steps 5-10 times in a row: 1. Lie on your belly (face-down) with your arms at your sides, and rest your forehead on the floor. 2. Tighten the muscles in your legs and your butt. 3. Slowly lift your chest off of the floor while you keep your hips on the floor. Keep the back of your head in line with the curve in your back. Look at the floor while you do this. 4. Stay in this position for 3-5 seconds. 5. Slowly lower your chest and your face to the floor. Contact a doctor if:  Your back pain gets a lot worse when you do an exercise.  Your back pain does not get better 2 hours after you exercise. If you have any of these problems, stop  doing the exercises. Do not do them again unless your doctor says it is okay. Get help right away if:  You have sudden, very bad back pain. If this happens, stop doing the exercises. Do not do them again unless your doctor says it is okay. This information is not intended to  replace advice given to you by your health care provider. Make sure you discuss any questions you have with your health care provider. Document Revised: 03/13/2018 Document Reviewed: 03/13/2018 Elsevier Patient Education  2021 [employer].

## 2020-10-26 NOTE — Progress Notes (Signed)
   CC: BP follow-up  HPI:  Mr.Giovoni D Mabile is a 40 y.o. male with PMH as below who presents with chronic left follow-up on his uncontrolled blood pressure. Please see problem based charting for evaluation, assessment and plan.  Past Medical History:  Diagnosis Date  . Asthma    Childhood, not active  . CHEST PAIN UNSPECIFIED 05/03/2010   Qualifier: Diagnosis of  By: Huntley Dec, Scott    . ELECTROCARDIOGRAM, ABNORMAL 05/03/2010   Qualifier: Diagnosis of  By: Huntley Dec, Scott    . Gout    Most recent flare 01/2018  . HYPERLIPIDEMIA 05/03/2010   Qualifier: Diagnosis of  By: Huntley Dec, Scott    . Hypertension   . Keratoconus of both eyes   . Legally blind     Review of Systems:  Constitutional: Negative for fever or fatigue Eyes: Negative for visual changes Respiratory: Negative for shortness of breath Cardiac: Negative for chest pain MSK: Positive for back pain Abdomen: Negative for abdominal pain, constipation or diarrhea Neuro: Negative for headache or weakness Psych: Positive for depression and anxiety  Physical Exam: General: Pleasant, welll-appearing obese male. NAD. Eyes: EOMI. PERRLA.  Neck: Supple. No C-spine tenderness. Normal ROM Cardiac: RRR. No murmurs, rubs or gallops.  No LE edema Respiratory: Lungs CTAB. No wheezing or crackles. Abdominal: Soft, symmetric and non tender. Normal BS. MSK: Mild paraspinal tenderness. Normal ROM.  Extremities: Atraumatic. Full ROM. Pulse distal palpable. Neuro: A&O x 3. Moves all extremities. Normal Sensation. No focal deficits.   Vitals:   10/26/20 1004 10/26/20 1011  BP: (!) 184/109 (!) 187/106  Pulse: 87 84  Temp: 98.4 F (36.9 C)   TempSrc: Oral   SpO2: 100%   Weight: (!) 307 lb 12.8 oz (139.6 kg)   Height: 6\' 2"  (1.88 m)     Assessment & Plan:   See Encounters Tab for problem based charting.  Patient discussed with Dr. , MD, MPH

## 2020-10-27 ENCOUNTER — Encounter: Payer: Self-pay | Admitting: Student

## 2020-10-27 DIAGNOSIS — M549 Dorsalgia, unspecified: Secondary | ICD-10-CM | POA: Insufficient documentation

## 2020-10-27 NOTE — Assessment & Plan Note (Signed)
Patient is down 3 lbs since his office visit last week. Encouraged to work lifestyle modifications with dietary changes and exercise to help with with loss which will help improve his BP. Patient would like some assistance with weight loss so he is agreeable to seeing the [facility] weight loss clinic.   Plan: --Referred to Medical Weight Management --Continue lifestyle modifications

## 2020-10-27 NOTE — Assessment & Plan Note (Signed)
Vitals:   10/26/20 1004 10/26/20 1011  BP: (!) 184/109 (!) 187/106   Patient here to follow-up for his uncontrolled blood pressure after he was started on amlodipine and HCTZ last week.  Patient's BP still significantly elevated with a systolic in the 180s and 190s but improved from last week. He denies any headaches, dizziness or blurry vision. Patient does report that he blacked out briefly after drinking 2 shots of liquor on Monday. Work-up for his uncontrolled hypertension has been negative for renal artery stenosis. Sleep apnea likely contributing factor to his elevated blood pressure. However patient has refused sleep study because he does not want to use CPAP at night. Patient plans to work on weight loss.  Patient also has a history of gout so will need close monitoring on thiazide diuretic.  Patient agreeable to adding a third antihypertensive to help control blood pressure.    Plan: --Start spironolactone 25 mg daily, can titrate up as needed to achieve BP goal of <130/90 --Continue amlodipine 10 mg daily --Continue Hyzaar 100-25 mg daily --Patient instructed to check BP at home and bring log to office visit --2 weeks follow-up for BP check --To reduce pill burden, can consider Tribenzor in the future.

## 2020-10-27 NOTE — Assessment & Plan Note (Signed)
No recent flares in the last few years.  Continue to monitor closely while on thiazide diuretic.
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
## 2020-10-27 NOTE — Assessment & Plan Note (Signed)
Patient with a history of cervical disc disorder reports mild back pain after he blacked out on Monday after drinking 2 shots of liquor. He endorses some residual back pain after the fall. He denies any headaches or trauma to the head.  States he only drinks on the weekends and does not drink excessively. On exam, patient endorse some mild paraspinal T-spine tenderness to palpation but no C-spine or L-spine tenderness. Back pain likely secondary to back sprain.   Plan: --Conservative management with NSAIDs and back exercises -- Continue to monitor for symptomatic improvement -- Back imaging if symptoms do not improve.

## 2020-10-28 ENCOUNTER — Encounter: Payer: Self-pay | Admitting: Student

## 2020-11-02 ENCOUNTER — Ambulatory Visit: Payer: Medicare HMO | Admitting: Behavioral Health

## 2020-11-02 ENCOUNTER — Other Ambulatory Visit: Payer: Self-pay

## 2020-11-02 DIAGNOSIS — F419 Anxiety disorder, unspecified: Secondary | ICD-10-CM

## 2020-11-02 DIAGNOSIS — Z630 Problems in relationship with spouse or partner: Secondary | ICD-10-CM

## 2020-11-02 DIAGNOSIS — F331 Major depressive disorder, recurrent, moderate: Secondary | ICD-10-CM
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
## 2020-11-08 NOTE — Progress Notes (Signed)
   CC: Hypertension, back pain, sleep difficulty  HPI:  [name] is a 40 y.o. with a history of hypertension, hyperlipidemia, morbid obesity, sleep difficulty, and back pain who is presenting for follow-up of his hypertension, back pain, difficulty.  Past Medical History:  Diagnosis Date  . Asthma    Childhood, not active  . CHEST PAIN UNSPECIFIED 05/03/2010   Qualifier: Diagnosis of  By: Huntley Dec, Scott    . ELECTROCARDIOGRAM, ABNORMAL 05/03/2010   Qualifier: Diagnosis of  By: Huntley Dec, Scott    . Gout    Most recent flare 01/2018  . HYPERLIPIDEMIA 05/03/2010   Qualifier: Diagnosis of  By: Huntley Dec, Scott    . Hypertension   . Keratoconus of both eyes   . Legally blind    Review of Systems:   Constitutional: Negative for chills and fever.  Respiratory: Negative for shortness of breath.   Cardiovascular: Negative for chest pain and leg swelling.  Gastrointestinal: Negative for abdominal pain, nausea and vomiting.  Neurological: Negative for dizziness and headaches.    Physical Exam:  Vitals:   11/09/20 0856 11/09/20 0913 11/09/20 0931  BP: (!) 179/103 (!) 170/87 (!) 155/99  Pulse: 79 81 77  Temp: 98.3 F (36.8 C)    TempSrc: Oral    SpO2: 100%    Weight: (!) 306 lb 6.4 oz (139 kg)    Height: 6\' 2"  (1.88 m)     Physical Exam Constitutional:      Appearance: He is obese.  HENT:     Head: Normocephalic and atraumatic.     Nose: Nose normal.  Eyes:     Extraocular Movements: Extraocular movements intact.     Conjunctiva/sclera: Conjunctivae normal.  Neck:     Vascular: No carotid bruit.     Comments: Tender nodule felt at right neck area, elevates with swallowing Cardiovascular:     Rate and Rhythm: Normal rate and regular rhythm.     Pulses: Normal pulses.     Heart sounds: Normal heart sounds.  Pulmonary:     Effort: Pulmonary effort is normal.     Breath sounds: Normal breath sounds.  Abdominal:     General: Abdomen is flat. Bowel sounds  are normal.     Palpations: Abdomen is soft.  Musculoskeletal:        General: Tenderness present. Normal range of motion.     Right shoulder: Normal. No swelling or tenderness. Normal range of motion.       Arms:     Cervical back: Tenderness present. No rigidity.     Comments: TTP over left trapezius area and along T spinous process  Skin:    General: Skin is warm and dry.     Capillary Refill: Capillary refill takes less than 2 seconds.  Neurological:     General: No focal deficit present.     Mental Status: He is alert and oriented to person, place, and time.  Psychiatric:        Mood and Affect: Mood normal.        Behavior: Behavior normal.      Assessment & Plan:   See Encounters Tab for problem based charting.  Patient discussed with Dr. 

## 2020-11-08 NOTE — Progress Notes (Signed)
Internal Medicine Clinic Attending  Case discussed with Dr. Amponsah  At the time of the visit.  We reviewed the resident's history and exam and pertinent patient test results.  I agree with the assessment, diagnosis, and plan of care documented in the resident's note.  

## 2020-11-09 ENCOUNTER — Encounter: Payer: Self-pay | Admitting: Internal Medicine

## 2020-11-09 ENCOUNTER — Other Ambulatory Visit: Payer: Self-pay

## 2020-11-09 ENCOUNTER — Ambulatory Visit (INDEPENDENT_AMBULATORY_CARE_PROVIDER_SITE_OTHER): Payer: Medicare HMO | Admitting: Internal Medicine

## 2020-11-09 VITALS — BP 155/99 | HR 77 | Temp 98.3°F | Ht 74.0 in | Wt 306.4 lb

## 2020-11-09 DIAGNOSIS — Z7689 Persons encountering health services in other specified circumstances: Secondary | ICD-10-CM

## 2020-11-09 DIAGNOSIS — E8729 Other acidosis: Secondary | ICD-10-CM

## 2020-11-09 DIAGNOSIS — E872 Acidosis: Secondary | ICD-10-CM

## 2020-11-09 DIAGNOSIS — M546 Pain in thoracic spine: Secondary | ICD-10-CM | POA: Diagnosis not present

## 2020-11-09 DIAGNOSIS — I10 Essential (primary) hypertension: Secondary | ICD-10-CM

## 2020-11-09 DIAGNOSIS — E041 Nontoxic single thyroid nodule: Secondary | ICD-10-CM | POA: Diagnosis not present

## 2020-11-09 MED ORDER — CYCLOBENZAPRINE HCL 5 MG PO TABS: 5.0000 mg | ORAL_TABLET | Freq: Three times a day (TID) | ORAL | 0 refills | Status: DC | PRN

## 2020-11-09 MED ORDER — SPIRONOLACTONE 25 MG PO TABS: 50.0000 mg | ORAL_TABLET | Freq: Every day | ORAL | 2 refills | Status: DC

## 2020-11-09 NOTE — Assessment & Plan Note (Signed)
Patient continues to endorse sleep difficulty, reports snoring in his sleep, morning headaches, in daytime somnolence.  He also has BMI of 39.34.  He initially did not want to do a sleep study because he did not want to use a CPAP, however discussed that this could be contributing to his blood pressure in that there are some other possible treatment options.  He is adamant about trying a CPAP.  -Order sleep study

## 2020-11-09 NOTE — Assessment & Plan Note (Signed)
Patient reports occasional palpitations, constipation, feeling sweaty, generalized fatigue, and some neck pain.  He denies any fevers, chills, nausea, vomiting, weight loss, tremors, vision changes, depression, or leg swelling.  He has a reported history of a thyroid nodule in 2019 however is unclear at this had been looked at before.  TSH in 2019 was WNL.  He also reports that his cousin may have some type of thyroid disorder, no other thyroid conditions in his family.  On exam he has a tender nodule on the right side of his neck, does elevate with swallow so does seem consistent with a thyroid nodule, however it is difficult to assess given body habitus.  -Check TSH -Thyroid ultrasound

## 2020-11-09 NOTE — Assessment & Plan Note (Signed)
Patient on spironolactone 25 mg daily, amlodipine 10 mg daily, Hyzaar 100-25 mg daily. Reports taking it every day, hasn't missed any medications over the past 3 weeks Has been having some headaches, after he is resting after a long day, throbbing headaches, posterior and on temporal area, occurring about every other day. Lasts for a few minutes. Has not taken anything for headaches. Denies vision changes, chest pain, SOB, nausea, vomiting, abdominal discomfort, or other symptoms. Blood pressure today is 179/103, repeat was 155/99.  Discussed that patient is still above goal and I would recommend increasing spironolactone today.  He is in agreement with this plan.  He had Aldo/renin ratio 8 in 2019.  And a renal artery ultrasound in 2019 that showed no evidence of renal artery stenosis.  He has symptoms of OSA however he has never had a sleep study because he did not want to use a CPAP in the past, he is open to having this done today. Patient is still above goal however he is doing much better in regards to medication adherence and blood pressure control. -Increase spironolactone to 50 mg daily - Continue amlodipine 10 mg daily - Continue Hyzaar 100-25 mg daily - Check BMP today

## 2020-11-09 NOTE — Assessment & Plan Note (Signed)
Patient continues to endorse some right upper back pain, he states that he blacked out about 2.5 weeks ago and he had a fall, he has been using ibuprofen as needed which she states does not help much.  He also has been trying to do some back stretches.  On exam he is some tenderness to palpation over right trapezius area, over the muscular area, he also has some mild tenderness to palpation over the paraspinal T-spine, he has some mild neck pain when he looks to his left, range of motion is full. Overall given the tenderness to palpation and location of the pain seems to be consistent with a musculoskeletal etiology.  Discussed that we can do a short course of Flexeril to see if this helps, advised to continue using his ibuprofen as needed in continue stretching the area.

## 2020-11-09 NOTE — Patient Instructions (Signed)
[name],  It was a pleasure to see you today. Thank you for coming in.   Today we discussed your blood pressure. This is doing better however it is still elevated. Please increase your spironolactone to 50 mg daily (2 pills). Continue taking your other medications.   I have ordered a sleep study to evaluate you for sleep apnea. Someone will call you to have the scheduled.    We also discussed your back pain. Please start using flexeril daily for the next week. Continue using the ibuprofen as needed and doing the back exercises.    I am checking some labs today and will contact you with the results. I have also ordered a thyroid ultrasound.   Please return to clinic in 1 month or sooner if needed.   Thank you again for coming in.   Claudean Severance.D.

## 2020-11-10 LAB — BMP8+ANION GAP
Anion Gap: 13 mmol/L (ref 10.0–18.0)
BUN/Creatinine Ratio: 16 (ref 9–20)
BUN: 18 mg/dL (ref 6–20)
CO2: 24 mmol/L (ref 20–29)
Calcium: 9.7 mg/dL (ref 8.7–10.2)
Chloride: 100 mmol/L (ref 96–106)
Creatinine, Ser: 1.13 mg/dL (ref 0.76–1.27)
Glucose: 87 mg/dL (ref 65–99)
Potassium: 4.3 mmol/L (ref 3.5–5.2)
Sodium: 137 mmol/L (ref 134–144)
eGFR: 85 mL/min/{1.73_m2} (ref >59)

## 2020-11-10 LAB — TSH: TSH: 0.989 u[IU]/mL (ref 0.450–4.500)

## 2020-11-13 NOTE — Progress Notes (Signed)
Internal Medicine Clinic Attending ° °Case discussed with Dr. Krienke  At the time of the visit.  We reviewed the resident’s history and exam and pertinent patient test results.  I agree with the assessment, diagnosis, and plan of care documented in the resident’s note.  °

## 2020-11-17 ENCOUNTER — Other Ambulatory Visit: Payer: Self-pay | Admitting: Student

## 2020-11-29 ENCOUNTER — Other Ambulatory Visit: Payer: Self-pay

## 2020-11-29 ENCOUNTER — Ambulatory Visit: Payer: Medicare HMO | Admitting: Behavioral Health

## 2020-11-29 DIAGNOSIS — F419 Anxiety disorder, unspecified: Secondary | ICD-10-CM

## 2020-11-29 DIAGNOSIS — F331 Major depressive disorder, recurrent, moderate: Secondary | ICD-10-CM

## 2020-11-29 NOTE — BH Specialist Note (Signed)
Integrated Behavioral Health via Telemedicine Visit  11/29/2020 WERNER LABELLA [id number]  Number of Integrated Behavioral Health visits: 2/6 Session Start time: 11:00am  Session End time: 11:30am Total time: 30  Referring Provider: Dr. 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 Discussed confidentiality: Yes   I discussed the limitations of telemedicine and the availability of in person appointments.  Discussed there is a possibility of technology failure and discussed alternative modes of communication if that failure occurs.  I discussed that engaging in this telemedicine visit, they consent to the provision of behavioral healthcare and the services will be billed under their insurance.  Patient and/or legal guardian expressed understanding and consented to Telemedicine visit: Yes   Presenting Concerns: Patient and/or family reports the following symptoms/concerns: Pt is, "doing amazing!" since our first appt. His BP has improved to 144/89 @ his recent reading, he is getting his new glasses next week, & he has reconciled some things about his divorce situation Duration of problem: months-some issues since childhood; Severity of problem: mild to moderate  Patient and/or Family's Strengths/Protective Factors: Social connections, Social and Emotional competence, Concrete supports in place (healthy food, safe environments, etc.), Sense of purpose and Physical Health (exercise, healthy diet, medication compliance, etc.)  Goals Addressed: Patient will: 1.  Reduce symptoms of:  anxiety, depression and stress  2.  Increase knowledge and/or ability of: coping skills, healthy habits and stress reduction  3.  Demonstrate ability to: Increase healthy adjustment to current life circumstances  Progress towards Goals: Ongoing  Interventions: Interventions utilized:  Solution-Focused Strategies, Behavioral Activation and Supportive Counseling Standardized Assessments completed: screeners prn  Patient and/or Family Response: Pt is receptive to call today & requests further appts  Assessment: Patient currently experiencing an active shift in how he treats himself. He is creating boundaries that are healthy & recognizing when others do not, "give in return." This shift has been, "All about me taking charge!" Pt is making new friends & getting to know someone well he takes an interest in as significant. Pt has realized he can make the dream he has held for the Center he wants to create, '[facility]', a reality.   Patient may benefit from cont'd support via telehealth services to support Pt through this important transition to his best, true self. Pt sts, "I am finally in my Light".  Plan: 1. Follow up with behavioral health clinician on : 2-3 wks for 30 min telehealth check-in 2. Behavioral recommendations: cont on your present trajectory, live in your confidence & trust your intentions. 3. Referral(s): Integrated [employer] (In Clinic)  I discussed the assessment and treatment plan with the patient and/or parent/guardian. They were provided an opportunity to ask questions and all were answered. They agreed with the plan and demonstrated an understanding of the instructions.   They were advised to call back or seek an in-person evaluation if the symptoms worsen or if the condition fails to improve as anticipated.  Deneise Lever, LMFT

## 2020-11-30 ENCOUNTER — Encounter: Payer: Self-pay | Admitting: *Deleted

## 2020-12-12 ENCOUNTER — Encounter: Payer: Self-pay | Admitting: Internal Medicine

## 2020-12-12 ENCOUNTER — Ambulatory Visit (INDEPENDENT_AMBULATORY_CARE_PROVIDER_SITE_OTHER): Payer: Medicare HMO | Admitting: Internal Medicine

## 2020-12-12 VITALS — BP 148/85 | HR 86 | Temp 98.4°F | Ht 74.0 in | Wt 312.0 lb

## 2020-12-12 DIAGNOSIS — I10 Essential (primary) hypertension: Secondary | ICD-10-CM

## 2020-12-12 DIAGNOSIS — W19XXXA Unspecified fall, initial encounter: Secondary | ICD-10-CM | POA: Insufficient documentation

## 2020-12-12 DIAGNOSIS — R519 Headache, unspecified: Secondary | ICD-10-CM | POA: Insufficient documentation

## 2020-12-12 DIAGNOSIS — G44319 Acute post-traumatic headache, not intractable: Secondary | ICD-10-CM | POA: Diagnosis not present

## 2020-12-12 DIAGNOSIS — K5900 Constipation, unspecified: Secondary | ICD-10-CM | POA: Diagnosis not present

## 2020-12-12 DIAGNOSIS — W19XXXD Unspecified fall, subsequent encounter: Secondary | ICD-10-CM | POA: Diagnosis not present

## 2020-12-12 MED ORDER — POLYETHYLENE GLYCOL 3350 17 G PO PACK: 17.0000 g | PACK | Freq: Every day | ORAL | 0 refills | Status: DC

## 2020-12-12 NOTE — Assessment & Plan Note (Signed)
Patient with complaints of constipation with severe straining with BMs. Currently taking Senna. Discussed increased water with Miralax and to up titrate Miralax until having daily soft stool. - Increase fluid intake - Continue Senna - 17 g packet Miralax daily ordered

## 2020-12-12 NOTE — Progress Notes (Signed)
   CC: 4 week follow up  HPI:  [name] is a 40 y.o. M/F, with a PMH noted below, who presents to the clinic for evaluation of a 4 week follow up. To see the management of their acute and chronic conditions, please see the A&P note under the Encounters tab.   Past Medical History:  Diagnosis Date   Asthma    Childhood, not active   CHEST PAIN UNSPECIFIED 05/03/2010   Qualifier: Diagnosis of  By: Huntley Dec, Scott     ELECTROCARDIOGRAM, ABNORMAL 05/03/2010   Qualifier: Diagnosis of  By: Huntley Dec, Scott     Gout    Most recent flare 01/2018   HYPERLIPIDEMIA 05/03/2010   Qualifier: Diagnosis of  By: Huntley Dec, Scott     Hypertension    Keratoconus of both eyes    Legally blind    Review of Systems:   Review of Systems  Constitutional:  Negative for chills, fever, malaise/fatigue and weight loss.  Respiratory:  Negative for cough.   Cardiovascular:  Negative for chest pain.  Gastrointestinal:  Negative for abdominal pain, constipation, diarrhea, nausea and vomiting.    Physical Exam:  Vitals:   12/12/20 0856  BP: (!) 148/85  Pulse: 86  Temp: 98.4 F (36.9 C)  TempSrc: Oral  SpO2: 100%  Weight: (!) 312 lb (141.5 kg)  Height: 6\' 2"  (1.88 m)   Physical Exam Vitals and nursing note reviewed.  Constitutional:      General: He is not in acute distress.    Appearance: He is well-developed. He is obese. He is not ill-appearing or toxic-appearing.  Cardiovascular:     Heart sounds: Normal heart sounds. No murmur heard.   No friction rub. No gallop.  Pulmonary:     Effort: Pulmonary effort is normal.     Breath sounds: Normal breath sounds. No wheezing, rhonchi or rales.  Abdominal:     General: Bowel sounds are normal.     Palpations: Abdomen is soft.     Tenderness: There is no abdominal tenderness.  Psychiatric:        Mood and Affect: Mood normal.        Behavior: Behavior normal.     Assessment & Plan:   See Encounters Tab for problem based  charting.  Patient discussed with Dr. 
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
## 2020-12-12 NOTE — Assessment & Plan Note (Signed)
Vitals with BMI 12/12/2020 11/09/2020 11/09/2020  Height 6\' 2"  - -  Weight 312 lbs - -  BMI 40.04 - -  Systolic 148 155  Diastolic 85 99 87  Pulse 86 77 81    Patient with blood better controlled from last encounter. He is currently taking amlodipine 10 mg daily, Hyzaar 100-25 mg daily, and spironolactone 50 mg daily. He has been taking his spironolactone 25 mg in the AM and PM instead of combined dosing. Tolerating medications well with no side effects. Instructed to take aldactone 2 pills at once daily. Patient voiced understanding. Has not completed sleep study at this time.  - Take Aldactone 50 mg (2 pills) once daily  - Continue current regimen.  - Obtain sleep study - FU in 2 weeks for BP recheck.

## 2020-12-12 NOTE — Patient Instructions (Addendum)
To [name],   It was a pleasure meeting you today!   Today we discussed your constipation, headaches, and blood pressure.   For your constipation, I would continue taking your laxative. I will also put in an order for Miralax, please take it in the morning and afternoon, and drink plenty of fluids. Continue using it until you are having soft bowel movements.   For your headaches, we will order brain imaging (CT scan) and will check your neck for fractures after your fall.   For your blood pressure. I will reach out for your sleep study. Please take your medications as prescribed. Please take two tablets once a day. We will have you follow up with [place] in 2 weeks for a blood pressure check.   Have a good day!  Dolan Amen, MD

## 2020-12-19 ENCOUNTER — Other Ambulatory Visit: Payer: Self-pay

## 2020-12-19 ENCOUNTER — Ambulatory Visit: Payer: Medicare HMO | Admitting: Behavioral Health

## 2020-12-19 DIAGNOSIS — G44319 Acute post-traumatic headache, not intractable: Secondary | ICD-10-CM

## 2020-12-19 DIAGNOSIS — F331 Major depressive disorder, recurrent, moderate: Secondary | ICD-10-CM

## 2020-12-19 NOTE — BH Specialist Note (Signed)
Integrated Behavioral Health via Telemedicine Visit  12/19/2020 Samuel Decker [id number]  Number of Integrated Behavioral Health visits: 3/6 Session Start time: 9:30am  Session End time: 10:00am Total time: 30  Referring Provider: Dr. Sharrell Ku, MD Patient/Family location: Pt is in car taking Son home [facility] Provider location: [facility] Office All persons participating in visit: Pt & Clinician Types of Service: Individual psychotherapy  I connected with Samuel Decker and/or Samuel Decker's  self  via  Telephone or Video Enabled Telemedicine Application  (Video is Caregility application) and verified that I am speaking with the correct person using two identifiers. Discussed confidentiality: Yes   I discussed the limitations of telemedicine and the availability of in person appointments.  Discussed there is a possibility of technology failure and discussed alternative modes of communication if that failure occurs.  I discussed that engaging in this telemedicine visit, they consent to the provision of behavioral healthcare and the services will be billed under their insurance.  Patient and/or legal guardian expressed understanding and consented to Telemedicine visit: Yes   Presenting Concerns: Patient and/or family reports the following symptoms/concerns: sense of calm due to recent family trip Duration of problem: months; Severity of problem: mild  Patient and/or Family's Strengths/Protective Factors: Social connections, Social and Emotional competence, Concrete supports in place (healthy food, safe environments, etc.), Sense of purpose, and Physical Health (exercise, healthy diet, medication compliance, etc.)  Goals Addressed: Patient will:  Reduce symptoms of: anxiety and depression   Increase knowledge and/or ability of: coping skills   Demonstrate ability to: Increase healthy adjustment to current life circumstances  Progress towards  Goals: Ongoing  Interventions: Interventions utilized:  Supportive Counseling Standardized Assessments completed: Not Needed  Patient and/or Family Response: Pt receptive to call but w/his 11yo Son in the car taking him home from wknd trip to [place] to visit family. Pt & Clinician kept call short & non-confidential.  Assessment: Patient currently experiencing reduced anx/dep due to trip visiting family. Pt & Son are in a good place & conversations in [place] were productive & validating. Pt missed his Fr's presence on Sun, but dealt better w/it this year. Son had cousins to play w/that made trip really special.  Patient may benefit from cont'd ck-ins for a few times to maintain support.  Plan: Follow up with behavioral health clinician on : 2-3 wks on telehealth for 30 min Behavioral recommendations: cont efforts to reduce BP readings. Pt is due for a CT scan to check if any residual damage due to his black out. Referral(s): Integrated [employer] (In Clinic)  I discussed the assessment and treatment plan with the patient and/or parent/guardian. They were provided an opportunity to ask questions and all were answered. They agreed with the plan and demonstrated an understanding of the instructions.   They were advised to call back or seek an in-person evaluation if the symptoms worsen or if the condition fails to improve as anticipated.  Samuel Lever, LMFT

## 2020-12-26 ENCOUNTER — Encounter: Payer: Self-pay | Admitting: Internal Medicine

## 2020-12-26 ENCOUNTER — Ambulatory Visit (INDEPENDENT_AMBULATORY_CARE_PROVIDER_SITE_OTHER): Payer: Medicare HMO | Admitting: Internal Medicine

## 2020-12-26 ENCOUNTER — Other Ambulatory Visit: Payer: Self-pay

## 2020-12-26 ENCOUNTER — Ambulatory Visit (HOSPITAL_COMMUNITY)
Admission: RE | Admit: 2020-12-26 | Discharge: 2020-12-26 | Disposition: A | Discharge status: Home / Self Care | Payer: Medicare HMO | Source: Ambulatory Visit | Attending: Internal Medicine | Admitting: Internal Medicine

## 2020-12-26 VITALS — BP 164/92 | HR 79 | Temp 98.3°F | Ht 74.0 in | Wt 312.3 lb

## 2020-12-26 DIAGNOSIS — W19XXXD Unspecified fall, subsequent encounter: Secondary | ICD-10-CM | POA: Diagnosis not present

## 2020-12-26 DIAGNOSIS — M542 Cervicalgia: Secondary | ICD-10-CM | POA: Insufficient documentation

## 2020-12-26 DIAGNOSIS — Z7689 Persons encountering health services in other specified circumstances: Secondary | ICD-10-CM

## 2020-12-26 DIAGNOSIS — M5010 Cervical disc disorder with radiculopathy, unspecified cervical region: Secondary | ICD-10-CM | POA: Diagnosis not present

## 2020-12-26 DIAGNOSIS — I10 Essential (primary) hypertension: Secondary | ICD-10-CM

## 2020-12-26 MED ORDER — LOSARTAN POTASSIUM 100 MG PO TABS: 100.0000 mg | ORAL_TABLET | Freq: Every day | ORAL | 1 refills | Status: DC

## 2020-12-26 MED ORDER — LORATADINE 10 MG PO TABS: 10.0000 mg | ORAL_TABLET | Freq: Every day | ORAL | 2 refills | Status: DC | PRN

## 2020-12-26 MED ORDER — SPIRONOLACTONE 100 MG PO TABS: 100.0000 mg | ORAL_TABLET | Freq: Every day | ORAL | 11 refills | Status: DC

## 2020-12-26 NOTE — Progress Notes (Signed)
   CC: Blood pressure check  HPI:  [name] is a 40 y.o. person, with a PMH noted below, who presents to the clinic for a blood pressure check. To see the management of their acute and chronic conditions, please see the A&P note under the Encounters tab.   Past Medical History:  Diagnosis Date   Asthma    Childhood, not active   CHEST PAIN UNSPECIFIED 05/03/2010   Qualifier: Diagnosis of  By: Huntley Dec, Scott     ELECTROCARDIOGRAM, ABNORMAL 05/03/2010   Qualifier: Diagnosis of  By: Huntley Dec, Scott     Gout    Most recent flare 01/2018   HYPERLIPIDEMIA 05/03/2010   Qualifier: Diagnosis of  By: Huntley Dec, Scott     Hypertension    Keratoconus of both eyes    Legally blind    Review of Systems:   Review of Systems  Constitutional:  Negative for chills, fever and weight loss.  Cardiovascular:  Negative for chest pain and palpitations.  Gastrointestinal:  Negative for abdominal pain, constipation, diarrhea, nausea and vomiting.  Neurological:  Negative for dizziness and headaches.    Physical Exam:  Vitals:   12/26/20 0846 12/26/20 0853  BP: (!) 164/99 (!) 164/92  Pulse: 92 79  Temp: 98.3 F (36.8 C)   TempSrc: Oral   SpO2: 100%   Weight: (!) 312 lb 4.8 oz (141.7 kg)   Height: 6\' 2"  (1.88 m)    Physical Exam Constitutional:      Appearance: Normal appearance. He is obese.  Cardiovascular:     Rate and Rhythm: Normal rate and regular rhythm.     Pulses: Normal pulses.     Heart sounds: Normal heart sounds. No murmur heard.   No friction rub. No gallop.  Pulmonary:     Effort: Pulmonary effort is normal.     Breath sounds: Normal breath sounds. No wheezing, rhonchi or rales.  Neurological:     Mental Status: He is oriented to person, place, and time.  Psychiatric:        Mood and Affect: Mood normal.        Behavior: Behavior normal.     Assessment & Plan:   See Encounters Tab for problem based charting.  Patient discussed with Dr. 

## 2020-12-26 NOTE — Assessment & Plan Note (Addendum)
Vitals with BMI 12/26/2020 12/26/2020 12/12/2020  Height - 6\' 2"  6\' 2"   Weight - 312 lbs 5 oz 312 lbs  BMI - 40.08 40.04  Systolic 164 164  Diastolic 92 99 85  Pulse 79 92 86  Patient presents with uncontrolled blood pressure.  He is currently taking amlodipine 10 mg daily, Aldactone milligrams daily, and Hyzaar 100-25 mg daily.  He states that he is compliant with his medications, but has been experiencing episodes of dizziness especially when he is working out and stretching.  He states that the symptoms have been ongoing since he started his blood pressure medications.  His aunt is a and had similar issues with blood pressure medications as well.  He notes that he needs to be on his medications but the side effects are interfering with his weight loss plan, he would like to see if his current regimen can be altered.  A/P: Patient presents with uncontrolled hypertension on 4 medications.  States that the 1 day he did not take his medications he did not experieincing episodes of dizziness.  He is currently on amlodipine, losartan, hydrochlorothiazide, and Aldactone.  Given his episode seem to occur while he is working out and experiencing dehydration likely exacerbated by hydrochlorothiazide 25 mg.  At this time we will discontinue hydrochlorothiazide and continue losartan and increase his Aldactone to 100 mg from 20 mg daily.  We will need to reassess symptoms in 4 weeks time, as well as check a BMP to assess his potassium. May need to add back hydrochlorothiazide at reduced dose.  Patient states he has continued with weight loss and exercising his blood pressure.  He is also going to obtain a sleep study.  - Continue Cozaar 100 mg daily - Increase Aldactone to 100 mg daily - Continue Norvasc 10 mg daily - Discontinue hydrochlorothiazide 25 mg daily - Repeat blood pressure in 4 weeks time - BMP at next clinic visit.

## 2020-12-26 NOTE — Patient Instructions (Addendum)
Samuel Decker,  It was a pleasure seeing you again. Today we discussed your dizziness with your blood pressure medications. Today we will discontinue your Hydrochlorothiazide and increase your aldactone. The current medications you should take for your blood pressure medication will be amlodipine, losartan, and aldactone. We will have you come back in one month to assess your blood pressure. Have a good day! Dolan Amen, MD

## 2020-12-27 ENCOUNTER — Telehealth: Payer: Self-pay

## 2020-12-27 NOTE — Addendum Note (Signed)
Addended by: Dolan Amen C on: 12/27/2020 09:55 AM   Modules accepted: Orders

## 2020-12-27 NOTE — Assessment & Plan Note (Addendum)
ADDENDUM:  Patient presented two weeks prior with neck pain and headaches after recent fall, where his head went through a wall. Went to Radiology on 12/26/2020 with new new soft tissue thickening of C6 measuring 3.2 cm. Discussed results with patient and will follow up with neck CT.

## 2020-12-27 NOTE — Telephone Encounter (Signed)
Received TC from Erskine Squibb at [facility] Radiology R/T imaging results.  Please see impression on Cervical Spine Complete imaging from yesterday, further assessment w/ cervical spine CT is recommended.  Will forward to Dr. Sande Brothers (Pt saw him in clinic yesterday and attending pool). SChaplin, RN,BSN

## 2020-12-28 NOTE — Addendum Note (Signed)
Addended by: Neomia Dear on: 12/28/2020 06:01 PM   Modules accepted: Orders

## 2020-12-29 NOTE — Progress Notes (Signed)
Internal Medicine Clinic Attending ? ?Case discussed with Dr. Winters  At the time of the visit.  We reviewed the resident?s history and exam and pertinent patient test results.  I agree with the assessment, diagnosis, and plan of care documented in the resident?s note.  ?

## 2021-01-05 ENCOUNTER — Ambulatory Visit (HOSPITAL_COMMUNITY)
Admission: RE | Admit: 2021-01-05 | Discharge: 2021-01-05 | Disposition: A | Discharge status: Home / Self Care | Payer: Medicare HMO | Source: Ambulatory Visit | Attending: Internal Medicine | Admitting: Internal Medicine

## 2021-01-05 ENCOUNTER — Other Ambulatory Visit: Payer: Self-pay

## 2021-01-05 DIAGNOSIS — Y939 Activity, unspecified: Secondary | ICD-10-CM | POA: Insufficient documentation

## 2021-01-05 DIAGNOSIS — J3489 Other specified disorders of nose and nasal sinuses: Secondary | ICD-10-CM | POA: Diagnosis not present

## 2021-01-05 DIAGNOSIS — Y929 Unspecified place or not applicable: Secondary | ICD-10-CM | POA: Insufficient documentation

## 2021-01-05 DIAGNOSIS — W19XXXD Unspecified fall, subsequent encounter: Secondary | ICD-10-CM | POA: Insufficient documentation

## 2021-01-05 DIAGNOSIS — R42 Dizziness and giddiness: Secondary | ICD-10-CM | POA: Diagnosis not present

## 2021-01-05 DIAGNOSIS — R519 Headache, unspecified: Secondary | ICD-10-CM | POA: Diagnosis not present

## 2021-01-12 ENCOUNTER — Ambulatory Visit: Payer: Medicare HMO | Admitting: Behavioral Health

## 2021-01-12 DIAGNOSIS — F331 Major depressive disorder, recurrent, moderate: Secondary | ICD-10-CM

## 2021-01-12 DIAGNOSIS — F419 Anxiety disorder, unspecified: Secondary | ICD-10-CM
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
## 2021-01-15 ENCOUNTER — Other Ambulatory Visit: Payer: Self-pay | Admitting: Student

## 2021-01-15 DIAGNOSIS — I10 Essential (primary) hypertension: Secondary | ICD-10-CM

## 2021-01-15 DIAGNOSIS — E782 Mixed hyperlipidemia: Secondary | ICD-10-CM

## 2021-01-23 ENCOUNTER — Other Ambulatory Visit: Payer: Self-pay

## 2021-01-23 ENCOUNTER — Ambulatory Visit (INDEPENDENT_AMBULATORY_CARE_PROVIDER_SITE_OTHER): Payer: Medicare HMO | Admitting: Student

## 2021-01-23 VITALS — BP 162/106 | HR 93 | Temp 98.4°F | Ht 74.0 in | Wt 315.0 lb

## 2021-01-23 DIAGNOSIS — M25511 Pain in right shoulder: Secondary | ICD-10-CM | POA: Insufficient documentation

## 2021-01-23 DIAGNOSIS — M7551 Bursitis of right shoulder: Secondary | ICD-10-CM | POA: Diagnosis not present

## 2021-01-23 DIAGNOSIS — E782 Mixed hyperlipidemia: Secondary | ICD-10-CM

## 2021-01-23 DIAGNOSIS — I10 Essential (primary) hypertension: Secondary | ICD-10-CM

## 2021-01-23 MED ORDER — ATORVASTATIN CALCIUM 40 MG PO TABS: 40.0000 mg | ORAL_TABLET | Freq: Every day | ORAL | 2 refills | Status: DC

## 2021-01-23 MED ORDER — METOPROLOL TARTRATE 25 MG PO TABS: 25.0000 mg | ORAL_TABLET | Freq: Two times a day (BID) | ORAL | 0 refills | Status: DC

## 2021-01-23 MED ORDER — CYCLOBENZAPRINE HCL 5 MG PO TABS: 5.0000 mg | ORAL_TABLET | Freq: Three times a day (TID) | ORAL | 0 refills | Status: DC | PRN

## 2021-01-23 MED ORDER — IBUPROFEN 600 MG PO TABS: 600.0000 mg | ORAL_TABLET | Freq: Four times a day (QID) | ORAL | 0 refills | Status: DC | PRN

## 2021-01-23 NOTE — Progress Notes (Signed)
   CC: right shoulder pain  HPI:  Mr.Cortland D Cork is a 40 y.o. with medical history as below presenting to [facility] for right shoulder pain.  Please see problem-based list for further details, assessments, and plans.  Past Medical History:  Diagnosis Date   Asthma    Childhood, not active   CHEST PAIN UNSPECIFIED 05/03/2010   Qualifier: Diagnosis of  By: Huntley Dec, Scott     ELECTROCARDIOGRAM, ABNORMAL 05/03/2010   Qualifier: Diagnosis of  By: Huntley Dec, Scott     Gout    Most recent flare 01/2018   HYPERLIPIDEMIA 05/03/2010   Qualifier: Diagnosis of  By: Huntley Dec, Scott     Hypertension    Keratoconus of both eyes    Legally blind    Review of Systems:  As per HPI  Physical Exam:  Vitals:   01/23/21 0953  BP: (!) 162/106  Pulse: 93  Temp: 98.4 F (36.9 C)  TempSrc: Oral  SpO2: 100%  Weight: (!) 315 lb (142.9 kg)  Height: 6\' 2"  (1.88 m)   General: Well-developed, well-nourished. No acute distress CV: Regular rate, rhythm. No murmurs, rubs, gallops appreciated. Warm extremities. Pulm: Normal work of breathing on room air. Clear to auscultation bilaterally. MSK: Normal bulk, tone. Mild swelling of right shoulder, arm when compared to left. Generalized tenderness to palpation on lateral right arm. Pain with abduction of right arm, otherwise normal. Range of motion of left arm normal. Cervical spine with mild bony tenderness. Right trapezius muscle tight to palpation.  Skin: Warm, dry. No rashes or lesions appreciated. Neuro: Awake, alert, conversing appropriately. Psych: Normal mood, affect, speech.  Assessment & Plan:   See Encounters Tab for problem based charting.  Patient discussed with Dr.  

## 2021-01-23 NOTE — Assessment & Plan Note (Signed)
BP Readings from Last 3 Encounters:  01/23/21 (!) 162/106  12/26/20 (!) 164/92  12/12/20 (!) 148/85   Blood pressure continues to be elevated similar to previous visits. He states he is currently not taking spironolactone. Mentions that he has had issues with hydrochlorothiazide and spironolactone in the past, as they both have made him dizzy. Denies chest pain or dyspnea.  Discussed with Mr. Detjen that we could trial beta blocker given this is a different class of medication. With HR 93 he appears to have sufficient room to titrate as needed. Will have him continue with amlodipine and losartan.  - Start metoprolol tartrate 25mg  twice daily - Continue amlodipine 10mg  daily, losartan 100mg  daily - Return in one month for BP check

## 2021-01-23 NOTE — Patient Instructions (Signed)
[name], it was a pleasure seeing you today!  Today we discussed: - Shoulder: I have prescribed an anti-inflammatory and muscle relaxer for your pain. We will also get a shoulder x-ray. If your symptoms do not improve, please return to clinic for a shoulder injection  - Blood pressure: I would like to start you on a new medication called metoprolol. We will have you return in one month to re-check this.  Tests ordered today:  - Shoulder X-ray  I have ordered the following medication/changed the following medications:   Start the following medications: Meds ordered this encounter  Medications   atorvastatin (LIPITOR) 40 MG tablet    Sig: Take 1 tablet (40 mg total) by mouth daily.    Dispense:  30 tablet    Refill:  2   ibuprofen (ADVIL) 600 MG tablet    Sig: Take 1 tablet (600 mg total) by mouth every 6 (six) hours as needed.    Dispense:  30 tablet    Refill:  0   cyclobenzaprine (FLEXERIL) 5 MG tablet    Sig: Take 1 tablet (5 mg total) by mouth 3 (three) times daily as needed for muscle spasms.    Dispense:  21 tablet    Refill:  0   metoprolol tartrate (LOPRESSOR) 25 MG tablet    Sig: Take 1 tablet (25 mg total) by mouth 2 (two) times daily.    Dispense:  60 tablet    Refill:  0     Follow-up:  1 month    Please make sure to arrive 15 minutes prior to your next appointment. If you arrive late, you may be asked to reschedule.   We look forward to seeing you next time. Please call our clinic at [phone] if you have any questions or concerns. The best time to call is Monday-Friday from 9am-4pm, but there is someone available 24/7. If after hours or the weekend, call the main hospital number and ask for the Internal Medicine Resident On-Call. If you need medication refills, please notify your pharmacy one week in advance and they will send [place] a request.  Thank you for letting us take part in your care. Wishing you the best!  Thank you, Evlyn Kanner, MD

## 2021-01-23 NOTE — Assessment & Plan Note (Signed)
Mr. Seago is presenting today to discuss right shoulder pain for the last few weeks. Describes sharp and achy pain that is localized to his lateral right shoulder and sometimes radiates down his arm. He mentions this initially started after he hit his head through a wall. He has been taking some Tylenol with minimal relief. The pain has been limiting his ability to do his work in addition to exercising regularly.   Imaging has previously been done of cervical spine, CT head. On examination, pain most consistent with sub-acromial bursitis given location of pain. Cannot rule out bone spur, will obtain imaging of shoulder. Appears the musculature on his back has been over-compensating for this, which has worsened his neck pain. I have instructed Mr. Skowron to continue with conservative measures along with short-course of muscle relaxer. If pain continues over the next week or so, can return to clinic for steroid injection.  - Right shoulder x-ray - Ibuprofen 600mg  every 6 hours as needed - Cyclobenzaprine 5mg  three times daily as needed x6 days - Return to clinic if no improvement of symptoms

## 2021-01-29 NOTE — Progress Notes (Signed)
Internal Medicine Clinic Attending  I saw and evaluated the patient.  I personally confirmed the key portions of the history and exam documented by Dr. Braswell and I reviewed pertinent patient test results.  The assessment, diagnosis, and plan were formulated together and I agree with the documentation in the resident's note.  

## 2021-02-06 ENCOUNTER — Ambulatory Visit: Payer: Medicare HMO | Admitting: Behavioral Health

## 2021-02-06 ENCOUNTER — Other Ambulatory Visit: Payer: Self-pay | Admitting: Internal Medicine

## 2021-02-06 DIAGNOSIS — M25511 Pain in right shoulder: Secondary | ICD-10-CM

## 2021-02-06 DIAGNOSIS — F419 Anxiety disorder, unspecified: Secondary | ICD-10-CM

## 2021-02-06 DIAGNOSIS — F331 Major depressive disorder, recurrent, moderate: Secondary | ICD-10-CM

## 2021-02-06 NOTE — BH Specialist Note (Signed)
Integrated Behavioral Health via Telemedicine Visit  02/06/2021 Samuel Decker [id number]  Number of Integrated Behavioral Health visits: 5/6 Session Start time: 9:00am  Session End time: 9:30am Total time: 30  Referring Provider: Dr. Corinna Gab, MD Patient/Family location: Pt @ home in private [facility] Provider location: [facility] Office All persons participating in visit: Pt & Clinician Types of Service: Individual psychotherapy  I connected with Samuel Decker and/or Samuel Decker's  self  via  Telephone or Video Enabled Telemedicine Application  (Video is Caregility application) and verified that I am speaking with the correct person using two identifiers. Discussed confidentiality:  5th visit  I discussed the limitations of telemedicine and the availability of in person appointments.  Discussed there is a possibility of technology failure and discussed alternative modes of communication if that failure occurs.  I discussed that engaging in this telemedicine visit, they consent to the provision of behavioral healthcare and the services will be billed under their insurance.  Patient and/or legal guardian expressed understanding and consented to Telemedicine visit:  5th visit  Presenting Concerns: Patient and/or family reports the following symptoms/concerns: elevated pain due to shoulder & health issues Duration of problem: months; Severity of problem: mild  Patient and/or Family's Strengths/Protective Factors: Social connections, Social and Emotional competence, Concrete supports in place (healthy food, safe environments, etc.), and Sense of purpose  Goals Addressed: Patient will:  Reduce symptoms of: anxiety, depression, and stress   Increase knowledge and/or ability of: coping skills, healthy habits, and stress reduction   Demonstrate ability to: Increase healthy adjustment to current life circumstances  Progress towards Goals: Ongoing  Interventions: Interventions  utilized:  Solution-Focused Strategies and Supportive Counseling Standardized Assessments completed:  screeners prn  Patient and/or Family Response: Pt receptive to call today & requests future appt  Assessment: Patient currently experiencing pain due to shoulder & health issues. Pt is exploring his spiritual gifts & holding services for the Community.  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  Plan: Follow up with behavioral health clinician on : 2-3 wks on telehealth for 30 min Behavioral recommendations: Be positive w/your GM while she is convalescing in the Rehab Facility; she is tired & you will bring her the gift of energy! Best expectations for your next Service on the 27th of Aug! Referral(s): Integrated [employer] (In Clinic)  I discussed the assessment and treatment plan with the patient and/or parent/guardian. They were provided an opportunity to ask questions and all were answered. They agreed with the plan and demonstrated an understanding of the instructions.   They were advised to call back or seek an in-person evaluation if the symptoms worsen or if the condition fails to improve as anticipated.  Deneise Lever, LMFT

## 2021-02-14 ENCOUNTER — Other Ambulatory Visit: Payer: Self-pay | Admitting: Student

## 2021-02-14 DIAGNOSIS — I10 Essential (primary) hypertension: Secondary | ICD-10-CM

## 2021-02-20 ENCOUNTER — Other Ambulatory Visit (HOSPITAL_COMMUNITY)
Admission: RE | Admit: 2021-02-20 | Discharge: 2021-02-20 | Disposition: A | Discharge status: Home / Self Care | Payer: Medicare HMO | Source: Ambulatory Visit | Attending: Internal Medicine | Admitting: Internal Medicine

## 2021-02-20 ENCOUNTER — Other Ambulatory Visit: Payer: Self-pay

## 2021-02-20 ENCOUNTER — Ambulatory Visit (INDEPENDENT_AMBULATORY_CARE_PROVIDER_SITE_OTHER): Payer: Medicare HMO | Admitting: Internal Medicine

## 2021-02-20 VITALS — BP 153/98 | HR 83 | Temp 98.5°F | Resp 28 | Ht 74.0 in | Wt 321.4 lb

## 2021-02-20 DIAGNOSIS — M25511 Pain in right shoulder: Secondary | ICD-10-CM | POA: Diagnosis not present

## 2021-02-20 DIAGNOSIS — I10 Essential (primary) hypertension: Secondary | ICD-10-CM | POA: Diagnosis not present

## 2021-02-20 DIAGNOSIS — Z7252 High risk homosexual behavior: Secondary | ICD-10-CM | POA: Insufficient documentation

## 2021-02-20 DIAGNOSIS — R69 Illness, unspecified: Secondary | ICD-10-CM | POA: Diagnosis not present

## 2021-02-20 NOTE — Progress Notes (Signed)
CC: HTN  HPI:  [name] is a 40 y.o. male with a past medical history stated below and presents today for HTN. Please see problem based assessment and plan for additional details.  Past Medical History:  Diagnosis Date   Asthma    Childhood, not active   CHEST PAIN UNSPECIFIED 05/03/2010   Qualifier: Diagnosis of  By: Huntley Dec, Scott     ELECTROCARDIOGRAM, ABNORMAL 05/03/2010   Qualifier: Diagnosis of  By: Huntley Dec, Scott     Gout    Most recent flare 01/2018   HYPERLIPIDEMIA 05/03/2010   Qualifier: Diagnosis of  By: Huntley Dec, Scott     Hypertension    Keratoconus of both eyes    Legally blind     Current Outpatient Medications on File Prior to Visit  Medication Sig Dispense Refill   amLODipine (NORVASC) 10 MG tablet Take 1 tablet (10 mg total) by mouth daily. 60 tablet 2   aspirin 81 MG chewable tablet Chew 1 tablet (81 mg total) by mouth daily. 90 tablet 1   atorvastatin (LIPITOR) 40 MG tablet Take 1 tablet (40 mg total) by mouth daily. 30 tablet 2   cyclobenzaprine (FLEXERIL) 5 MG tablet Take 1 tablet (5 mg total) by mouth 3 (three) times daily as needed for muscle spasms. 21 tablet 0   ibuprofen (ADVIL) 600 MG tablet Take 1 tablet (600 mg total) by mouth every 6 (six) hours as needed. 30 tablet 0   loratadine (CLARITIN) 10 MG tablet Take 1 tablet (10 mg total) by mouth daily as needed for allergies. 30 tablet 2   losartan (COZAAR) 100 MG tablet TAKE 1 TABLET BY MOUTH EVERY DAY 30 tablet 1   metoprolol tartrate (LOPRESSOR) 25 MG tablet TAKE 1 TABLET BY MOUTH TWICE A DAY 60 tablet 0   polyethylene glycol (MIRALAX) 17 g packet Take 17 g by mouth daily. 14 each 0   No current facility-administered medications on file prior to visit.    Family History  Problem Relation Age of Onset   Hypertension Mother    Hypertension Father    Diabetes Father    Heart disease Father    Hypertension Paternal Grandmother    Diabetes Paternal Grandmother     Social  History   Socioeconomic History   Marital status: Married    Spouse name: Not on file   Number of children: Not on file   Years of education: Not on file   Highest education level: Not on file  Occupational History   Occupation: diability  Tobacco Use   Smoking status: Every Day    Packs/day: 0.50    Years: 3.00    Pack years: 1.50    Types: Cigarettes   Smokeless tobacco: Never   Tobacco comments:    5-6 cigs per day .  Stopped 9 days ago  Vaping Use   Vaping Use: Every day  Substance and Sexual Activity   Alcohol use: Yes    Alcohol/week: 6.0 standard drinks    Types: 6 Shots of liquor per week    Comment: occ   Drug use: No   Sexual activity: Not on file  Other Topics Concern   Not on file  Social History Narrative   Not on file   Social Determinants of Health   Financial Resource Strain: Not on file  Food Insecurity: Not on file  Transportation Needs: Not on file  Physical Activity: Not on file  Stress: Not on file  Social  Connections: Not on file  Intimate Partner Violence: Not on file    Review of Systems: ROS negative except for what is noted on the assessment and plan.  Vitals:   02/20/21 1015  BP: (!) 153/98  Pulse: 83  Resp: (!) 28  Temp: 98.5 F (36.9 C)  TempSrc: Oral  SpO2: 100%  Weight: (!) 321 lb 6.4 oz (145.8 kg)  Height: 6\' 2"  (1.88 m)    Physical Exam: Gen: A&O x3 and in no apparent distress, well appearing and nourished. HEENT: Head - normocephalic, atraumatic. Eye -  visual acuity grossly intact, conjunctiva clear, sclera non-icteric, EOM intact. Mouth - No obvious caries or periodontal disease. Neck: no obvious masses or nodules, AROM intact. CV: RRR, no murmurs, rubs, or gallops. S1/S2 presents  Resp: Clear to ascultation bilaterally  Abd: BS (+) x4, soft, non-tender, without obvious hepatosplenomegaly or masses MSK: Grossly normal AROM and strength x4 extremities. Positive Roos test and positive Wright test. Positive neer's  test. TTP at the [facility] joint right shoulder.  Skin: good skin turgor, no rashes, unusual bruising, or prominent lesions.  Neuro: No focal deficits, grossly normal sensation and coordination.  Psych: Oriented x3 and responding appropriately. Intact recent and remote memory, normal mood, judgement, affect , and insight.    Assessment & Plan:   See Encounters Tab for problem based charting.  Patient discussed with Dr. [facility], D.O. [facility] Internal Medicine, PGY-3 Pager: [phone], Phone: [phone] Date 02/21/2021 Time 6:26 AM

## 2021-02-20 NOTE — Patient Instructions (Signed)
Thank you, [name] for allowing [place] to provide your care today. Today we discussed blood pressure, anxiety, STI testing.    Labs Ordered: Lab Orders         RPR w/reflex to TrepSure         HIV antibody (with reflex)      Tests Ordered: Orders Placed This Encounter  Procedures   RPR w/reflex to TrepSure   HIV antibody (with reflex)      Referrals Ordered:  Referral Orders  No referral(s) requested today     Medication Changes:   Losartan 50 mg daily    Health Maintenance Screening: There are no preventive care reminders to display for this patient.   Instructions:  - please make a follow up appointment with Dr. Monna Fam.  Follow up:  2-4 weeks    Remember: If you have any questions or concerns, call our clinic at [phone] or after hours call [phone] and ask for the internal medicine resident on call.  Dellia Cloud, D.O. [facility]

## 2021-02-21 ENCOUNTER — Encounter: Payer: Self-pay | Admitting: Internal Medicine

## 2021-02-21 LAB — URINE CYTOLOGY ANCILLARY ONLY
Chlamydia: NEGATIVE
Comment: NEGATIVE
Comment: NORMAL
Neisseria Gonorrhea: NEGATIVE

## 2021-02-21 LAB — RPR, QUANT: RPR, Quant: 1:8 {titer} — ABNORMAL HIGH

## 2021-02-21 LAB — RPR W/REFLEX TO TREPSURE: RPR: REACTIVE — AB

## 2021-02-21 LAB — HIV ANTIBODY (ROUTINE TESTING W REFLEX): HIV Screen 4th Generation wRfx: NONREACTIVE

## 2021-02-21 NOTE — Assessment & Plan Note (Addendum)
Patient states that he is engaging in sexual intercourse without barrier protection and is concerned regarding STIs.  We agreed to routinely test patient for STIs.  Looking through the patient's history, the patient has sexual history including sexual intercourse with men. Will need additional counseling on appropriate barrier contraceptive, current risk of sexually transmitted infections, and possible need for monkey pox vaccination if available.  Additionally, I believe he would benefit from starting HIV PrEP at his follow-up appointment.  Important note, the patient will need more further sexual history at his follow-up appointment.  Plan: -STI screening -Discussed barrier protection for sexual intercourse at next appointment -Counseled regarding monkey pox vaccination and next appointment -Will need to counsel patient regarding HIV PrEP at next appointment  **ADDENDUM**  RPR: reactive Quant: 1:8   No treatment indicated. Patient aware.   Component Ref Range & Units 1 yr ago  (11/23/19) 1 yr ago  (08/24/19) 1 yr ago  (04/07/19) 2 yr ago  (01/15/19)  Rapid Plasma Reagin, Quant NonRea<1:1 1:16 High   1:16 High   1:64 High   1:128 High    T Pallidum Abs Non Reactive Reactive Abnormal   Reactive Abnormal   Reactive Abnormal   Reactive Abnormal

## 2021-02-21 NOTE — Assessment & Plan Note (Signed)
Briefly counseled the patient regarding his obesity.  Patient states that he is fully employed.  We discussed his ability to get commercial insurance.  I counseled him on the importance of starting medication such as GLP-1a for weight loss and how this would be easier to access with commercial insurance.  This I will reach out to our clinical pharmacist to see what medication assistance he would be able to receive.

## 2021-02-21 NOTE — Assessment & Plan Note (Signed)
Assessment: Patient presents for further evaluation management of his right shoulder pain.  Patient states that that this pain going on for the past 2 months.  He believes that his workout routine may have precipitated his symptoms.  He states that he is recently started working out more with lifting weights, but over the past 2 months he has not been able to work out due to his symptoms. He expresses paresthesias and pain that radiates from his shoulder down his arm particularly with movement of his arm and shoulder. He denies any high risk features such as night sweats, weight loss, weakness, or numbness.   On physical exam, patient has full active and passive range of motion of his right shoulder, elbow, and wrist.  Motion of his shoulder precipitates paresthesias and pain.  He also has tenderness to palpation at the [facility] joint anterior/superiorly.  There is no obvious swelling, erythema, warmth or rash over the skin.  Patient has 5/5 strength in all directions. Roos and Wright's test positive. Additionally patient has a positive Neer's test. Negative empty. There was mild TTP to the [facility] joint.   Patient likely has right shoulder impingement versus thoracic outlet syndrome.  Plan: - XR of right shoulder -Counseled patient on activity modification -Showed patient gentle stretching to perform at home. -Counseled patient regarding Tylenol and NSAID therapy for his pain.

## 2021-02-21 NOTE — Assessment & Plan Note (Signed)
Assessment: Patient presents for further evaluation management of his hypertension.  Patient's blood pressure today is 153/98 on amlodipine 10 mg, metoprolol 25 mg twice daily and losartan 100 mg daily.    Patient states that he still having a difficult time with orthostatic symptoms.  He states that this is since improves since running out of his losartan.  Due to patient was running late for his appointment we are unable to perform orthostatic vitals at this visit.  He will likely need additional evaluation for his orthostatic symptoms including titrating of his beta-blocker or amlodipine down further and supplementing his medications with other antihypertensive medications including hydrochlorothiazide or clonidine as tolerated.   Due to his symptoms we agreed to change his losartan to 50 mg  And follow up in 2 weeks.  I would recommend getting orthostatic vitals switching patient to metoprolol succinate 25 mg once daily. This may improve his orthostatic symptoms. Additionally, I would try starting clonidine.   Plan: -Decrease losartan to 50 mg daily -Follow-up in 2 weeks -Orthostatic vitals at next appointment -Switch to metoprolol succinate 25 mg daily at next appointment -Consider adding clonidine versus hydrochlorothiazide at next appointment

## 2021-02-22 ENCOUNTER — Telehealth: Payer: Self-pay | Admitting: Internal Medicine

## 2021-02-22 NOTE — Telephone Encounter (Signed)
Patient was informed of his test results.  Patient states that he is having some sinus pressure.  Advised him to use intranasal steroid and antihistamine for atopic type symptoms.  Patient states that he would like to discuss this with the doctor at his next appointment.  I informed him that he will be seeing Dr. Sande Brothers at that time.  Chari Manning, D.O.  Internal Medicine Resident, PGY-3 [facility]  Pager: [phone] 3:53 PM, 02/22/2021   **Please contact the on call pager after 5 pm and on weekends at [phone].**

## 2021-02-23 NOTE — Progress Notes (Signed)
Internal Medicine Clinic Attending  Case discussed with Dr. Coe  At the time of the visit.  We reviewed the resident's history and exam and pertinent patient test results.  I agree with the assessment, diagnosis, and plan of care documented in the resident's note.  

## 2021-03-07 ENCOUNTER — Encounter: Payer: Medicare HMO | Admitting: Internal Medicine

## 2021-03-07 ENCOUNTER — Ambulatory Visit: Payer: Medicare HMO | Admitting: Behavioral Health

## 2021-03-09 ENCOUNTER — Other Ambulatory Visit: Payer: Self-pay

## 2021-03-09 ENCOUNTER — Ambulatory Visit (HOSPITAL_COMMUNITY)
Admission: RE | Admit: 2021-03-09 | Discharge: 2021-03-09 | Disposition: A | Discharge status: Home / Self Care | Payer: Medicare HMO | Source: Ambulatory Visit | Attending: Internal Medicine | Admitting: Internal Medicine

## 2021-03-09 DIAGNOSIS — E042 Nontoxic multinodular goiter: Secondary | ICD-10-CM | POA: Diagnosis not present

## 2021-03-09 DIAGNOSIS — E041 Nontoxic single thyroid nodule: Secondary | ICD-10-CM | POA: Insufficient documentation

## 2021-03-10 ENCOUNTER — Other Ambulatory Visit: Payer: Self-pay

## 2021-03-10 ENCOUNTER — Encounter: Payer: Self-pay | Admitting: Internal Medicine

## 2021-03-10 ENCOUNTER — Ambulatory Visit (INDEPENDENT_AMBULATORY_CARE_PROVIDER_SITE_OTHER): Payer: Medicare HMO | Admitting: Internal Medicine

## 2021-03-10 ENCOUNTER — Other Ambulatory Visit: Payer: Self-pay | Admitting: Internal Medicine

## 2021-03-10 VITALS — BP 165/96 | HR 74 | Temp 98.4°F | Resp 28 | Ht 74.0 in | Wt 317.6 lb

## 2021-03-10 DIAGNOSIS — I10 Essential (primary) hypertension: Secondary | ICD-10-CM

## 2021-03-10 DIAGNOSIS — E041 Nontoxic single thyroid nodule: Secondary | ICD-10-CM

## 2021-03-10 DIAGNOSIS — R69 Illness, unspecified: Secondary | ICD-10-CM | POA: Diagnosis not present

## 2021-03-10 DIAGNOSIS — E782 Mixed hyperlipidemia: Secondary | ICD-10-CM

## 2021-03-10 DIAGNOSIS — Z7252 High risk homosexual behavior: Secondary | ICD-10-CM | POA: Diagnosis not present

## 2021-03-10 MED ORDER — METOPROLOL TARTRATE 50 MG PO TABS: 50.0000 mg | ORAL_TABLET | Freq: Two times a day (BID) | ORAL | 3 refills | Status: DC

## 2021-03-10 MED ORDER — FLUTICASONE PROPIONATE 50 MCG/ACT NA SUSP: 1.0000 | Freq: Every day | NASAL | 0 refills | Status: DC

## 2021-03-10 MED ORDER — ATORVASTATIN CALCIUM 40 MG PO TABS: 40.0000 mg | ORAL_TABLET | Freq: Every day | ORAL | 3 refills | Status: DC

## 2021-03-10 MED ORDER — AMLODIPINE BESYLATE 10 MG PO TABS: 10.0000 mg | ORAL_TABLET | Freq: Every day | ORAL | 3 refills | Status: DC

## 2021-03-10 MED ORDER — ASPIRIN 81 MG PO CHEW: 81.0000 mg | CHEWABLE_TABLET | Freq: Every day | ORAL | 3 refills | Status: AC

## 2021-03-10 NOTE — Assessment & Plan Note (Signed)
Worked up during his last appointment on 02/23/2021.  Due to his concern about his thyroid nodules, this was not addressed today but will be brought up at his next visit in 2 weeks.  His initial STI screening was negative we will further discuss barrier protection for sexual intercourse, and starting HIV PrEP at next visit.

## 2021-03-10 NOTE — Patient Instructions (Signed)
Samuel Decker,   It was a pleasure seeing you today. Today we discussed your blood pressure, thyroid nodules, and weight loss.   For your blood pressure I have discontinued the Losartan, and I'm glad that you are no longer having the light headedness. We have increased your metoprolol to 50 mg daily. Continue working on losing weight, you are doing a good job on diet control.   For your weight, I will reach out to see if you can qualify for a medication to assist with your weight loss journey.   For your thyroid nodule, I will put in a referral to have a needle sample those nodules.   I will see you in 2 weeks to recheck your blood pressure. Have a good day!  Dolan Amen, MD

## 2021-03-10 NOTE — Assessment & Plan Note (Addendum)
Patient presents to the clinic today with concerns of anxiety secondary to the results of his thyroid ultrasound.  We discussed his results, and we will further evaluate his nodules with a fine-needle aspiration ultrasound. Discussed his last TSH in May being within normal limits.  Discussed etiologies of thyroid nodules and patient in agreement for further workup.  - Thyroid US with FNA

## 2021-03-10 NOTE — Progress Notes (Signed)
   CC: 4-week follow-up, BP check, thyroid nodules  HPI:  Samuel Decker is a 40 y.o. person, with a PMH noted below, who presents to the clinic blood pressure check and concern for thyroid nodules. To see the management of their acute and chronic conditions, please see the A&P note under the Encounters tab.   Past Medical History:  Diagnosis Date  . Asthma    Childhood, not active  . CHEST PAIN UNSPECIFIED 05/03/2010   Qualifier: Diagnosis of  By: Huntley Dec, Scott    . ELECTROCARDIOGRAM, ABNORMAL 05/03/2010   Qualifier: Diagnosis of  By: Huntley Dec, Scott    . Gout    Most recent flare 01/2018  . HYPERLIPIDEMIA 05/03/2010   Qualifier: Diagnosis of  By: Huntley Dec, Scott    . Hypertension   . Keratoconus of both eyes   . Legally blind    Review of Systems:   Review of Systems  Constitutional:  Positive for weight loss. Negative for chills, fever and malaise/fatigue.       Intensional weight loss  Gastrointestinal:  Negative for abdominal pain, constipation, diarrhea, nausea and vomiting.  Neurological:  Negative for dizziness and headaches.    Physical Exam:  Vitals:   03/10/21 0918 03/10/21 0927  BP: (!) 162/101 (!) 165/96  Pulse: 81 74  Resp: (!) 28   Temp: 98.4 F (36.9 C)   TempSrc: Oral   SpO2: 100%   Weight: (!) 317 lb 9.6 oz (144.1 kg)   Height: 6\' 2"  (1.88 m)    Physical Exam Vitals reviewed.  Constitutional:      General: He is not in acute distress.    Appearance: Normal appearance. He is not ill-appearing or toxic-appearing.  HENT:     Head: Normocephalic and atraumatic.  Eyes:     General:        Right eye: No discharge.        Left eye: No discharge.     Conjunctiva/sclera: Conjunctivae normal.  Cardiovascular:     Rate and Rhythm: Normal rate and regular rhythm.     Pulses: Normal pulses.     Heart sounds: Normal heart sounds. No murmur heard.   No friction rub. No gallop.  Pulmonary:     Effort: Pulmonary effort is normal.     Breath  sounds: Normal breath sounds. No wheezing, rhonchi or rales.  Abdominal:     General: Bowel sounds are normal.     Palpations: Abdomen is soft.     Tenderness: There is no abdominal tenderness. There is no guarding.  Neurological:     Mental Status: He is alert and oriented to person, place, and time.  Psychiatric:        Mood and Affect: Mood normal.        Behavior: Behavior normal.     Assessment & Plan:   See Encounters Tab for problem based charting.  Patient discussed with Dr. 

## 2021-03-10 NOTE — Assessment & Plan Note (Signed)
Patient presents for follow-up on his hypertension.  Since his last visit with the [facility] he has self discontinued the losartan, which has alleviated his lightheadedness/dizziness.  He is currently taking amlodipine 10 mg daily and metoprolol 25 mg twice daily.  His blood pressure today is uncontrolled at 165/96 with a heart rate of 74.  He states that he would prefer not to be on many medications for his blood pressure, and is opting to work on weight loss.  He is already made changes diet is weight is down 4 pound since his last visit.  We discussed uptitrating his metoprolol today while he continues to work on diet control.  He is experiencing any side effects of his amlodipine or beta-blocker at this time. - Increase metoprolol to 50 mg twice daily - 2-week follow-up, if blood pressure remains uncontrolled will consider clonidine versus HCTZ

## 2021-03-22 ENCOUNTER — Ambulatory Visit: Payer: Medicare HMO | Admitting: Behavioral Health

## 2021-03-24 ENCOUNTER — Encounter: Payer: Self-pay | Admitting: Internal Medicine

## 2021-03-24 ENCOUNTER — Other Ambulatory Visit: Payer: Self-pay

## 2021-03-24 ENCOUNTER — Ambulatory Visit (INDEPENDENT_AMBULATORY_CARE_PROVIDER_SITE_OTHER): Payer: Medicare HMO | Admitting: Internal Medicine

## 2021-03-24 VITALS — BP 156/91 | HR 68 | Temp 98.1°F | Resp 32 | Ht 74.0 in | Wt 322.7 lb

## 2021-03-24 DIAGNOSIS — H6692 Otitis media, unspecified, left ear: Secondary | ICD-10-CM | POA: Diagnosis not present

## 2021-03-24 DIAGNOSIS — H6690 Otitis media, unspecified, unspecified ear: Secondary | ICD-10-CM | POA: Insufficient documentation

## 2021-03-24 DIAGNOSIS — I10 Essential (primary) hypertension: Secondary | ICD-10-CM

## 2021-03-24 DIAGNOSIS — R69 Illness, unspecified: Secondary | ICD-10-CM | POA: Diagnosis not present

## 2021-03-24 DIAGNOSIS — Z7252 High risk homosexual behavior: Secondary | ICD-10-CM

## 2021-03-24 DIAGNOSIS — Z23 Encounter for immunization: Secondary | ICD-10-CM

## 2021-03-24 MED ORDER — SEMAGLUTIDE(0.25 OR 0.5MG/DOS) 2 MG/1.5ML ~~LOC~~ SOPN: 0.2500 mg | PEN_INJECTOR | SUBCUTANEOUS | 0 refills | Status: DC

## 2021-03-24 MED ORDER — AMOXICILLIN 500 MG PO CAPS: 500.0000 mg | ORAL_CAPSULE | Freq: Two times a day (BID) | ORAL | 0 refills | Status: AC

## 2021-03-24 NOTE — Assessment & Plan Note (Addendum)
Patient voices frustration, as he has gained weight from his last visit.  He states that he is still working on his diet and is continuing to exercise at this time.  He has been currently trying to work on diet and exercise, but is having difficulty losing the weight.  Given his morbid obesity, prediabetes, and hypertension, we will start him on subcu semaglutide. - Semaglutide 0.25 mg subcutaneous injection once weekly - Reevaluate after 4 weeks

## 2021-03-24 NOTE — Assessment & Plan Note (Addendum)
Vitals with BMI 03/24/2021 03/24/2021 03/10/2021  Height - 6\' 2"  -  Weight - 322 lbs 11 oz -  BMI - 41.41 -  Systolic 156 157  Diastolic 91 98 96  Pulse 68 76 74   Patient presents for follow-up on his blood pressure.  He states that he is continue to work on dieting and exercise and is compliant with his current medication regimen.  He does express that he does have some anxiety given his recent thyroid nodules, and helping manage with his family.  He would like to continue his current regimen and continue working on weight loss to get better control of his high blood pressures. - Continue amlodipine 10 mg daily - Continue metoprolol 50 mg twice daily

## 2021-03-24 NOTE — Progress Notes (Signed)
   CC: BP Check    HPI:  Samuel Decker is a 40 y.o. person, with a PMH noted below, who presents to the clinic BP Check. To see the management of their acute and chronic conditions, please see the A&P note under the Encounters tab.   Past Medical History:  Diagnosis Date   Asthma    Childhood, not active   CHEST PAIN UNSPECIFIED 05/03/2010   Qualifier: Diagnosis of  By: Huntley Dec, Scott     ELECTROCARDIOGRAM, ABNORMAL 05/03/2010   Qualifier: Diagnosis of  By: Huntley Dec, Scott     Gout    Most recent flare 01/2018   HYPERLIPIDEMIA 05/03/2010   Qualifier: Diagnosis of  By: Huntley Dec, Scott     Hypertension    Keratoconus of both eyes    Legally blind    Review of Systems:   Review of Systems  Constitutional:  Negative for chills, fever, malaise/fatigue and weight loss.  HENT:  Positive for ear pain. Negative for congestion, ear discharge, nosebleeds and sinus pain.   Respiratory:  Negative for stridor.   Cardiovascular:  Negative for chest pain and palpitations.  Gastrointestinal:  Negative for abdominal pain, constipation, diarrhea, nausea and vomiting.  Neurological:  Negative for dizziness and headaches.    Physical Exam:  Vitals:   03/24/21 0946  BP: (!) 157/98  Pulse: 76  Resp: (!) 32  Temp: 98.1 F (36.7 C)  TempSrc: Oral  SpO2: 100%  Weight: (!) 322 lb 11.2 oz (146.4 kg)  Height: 6\' 2"  (1.88 m)   Physical Exam Constitutional:      General: He is not in acute distress.    Appearance: Normal appearance. He is obese. He is not ill-appearing, toxic-appearing or diaphoretic.     Comments: Obese male, sitting comfortably in chair, answers questions appropriately, no acute distress  HENT:     Right Ear: Tympanic membrane, ear canal and external ear normal. There is no impacted cerumen.     Ears:     Comments: Erythema noted in the left ear canal, TM intact and erythematous. Cardiovascular:     Rate and Rhythm: Normal rate and regular rhythm.   Neurological:     Mental Status: He is alert and oriented to person, place, and time.  Psychiatric:        Mood and Affect: Mood normal.        Behavior: Behavior normal.     Assessment & Plan:   See Encounters Tab for problem based charting.  Patient discussed with Dr. 

## 2021-03-24 NOTE — Patient Instructions (Addendum)
Samuel Decker,   It was a pleasure seeing you again! Today we discussed  your blood pressure, HIV prophylaxis, ear pain, and weight loss.   For your blood pressure and weight loss, please continue working on your diet and exercise. I am going to work towards getting you a once a week injectable medication called semaglutide that is an add on medication to help weight loss for people who are actively dieting and exercising.   For your ear pain, you do have an ear infection, we will prescribe you a course of antibiotics, I will have you scheduled for a 2 week follow up to see how you are doing after.   After our discussion about HIV prophylaxis, if you are interested in initiating this medication please feel free to talk about starting at future appointments.   We will see you in two week!  Dolan Amen, MD

## 2021-03-24 NOTE — Assessment & Plan Note (Signed)
Patient seen at follow-up today for blood pressure reading.  We did discuss considering starting daily prep for high risk encounters.  Patient states that at this time, he has enough medications to take care of, he has not had sexual intercourse "in a while" we will like to hold off on starting any other medication at this time.  Discussed with the patient that if he would ever like to start, to please bring it up in the clinic for further discussion.

## 2021-03-24 NOTE — Assessment & Plan Note (Signed)
Patient presents with several weeks of ear pain.  He denies fevers, nausea, vomiting, or signs of infection.  On physical examination his left ear canal is erythematous, TM is intact but also erythematous with no effusion. - Start amoxicillin 500 mg twice daily for 7 days - Follow-up in 2 weeks

## 2021-03-24 NOTE — Progress Notes (Signed)
Internal Medicine Clinic Attending ? ?Case discussed with Dr. Winters  At the time of the visit.  We reviewed the resident?s history and exam and pertinent patient test results.  I agree with the assessment, diagnosis, and plan of care documented in the resident?s note.  ?

## 2021-03-27 ENCOUNTER — Other Ambulatory Visit: Payer: Self-pay | Admitting: Student

## 2021-03-27 DIAGNOSIS — I10 Essential (primary) hypertension: Secondary | ICD-10-CM

## 2021-04-03 ENCOUNTER — Encounter: Payer: Self-pay | Admitting: *Deleted

## 2021-04-03 NOTE — Progress Notes (Unsigned)

## 2021-04-05 ENCOUNTER — Other Ambulatory Visit (HOSPITAL_COMMUNITY)
Admission: RE | Admit: 2021-04-05 | Discharge: 2021-04-05 | Disposition: A | Discharge status: Home / Self Care | Payer: Medicare HMO | Source: Ambulatory Visit | Attending: Internal Medicine | Admitting: Internal Medicine

## 2021-04-05 ENCOUNTER — Ambulatory Visit
Admission: RE | Admit: 2021-04-05 | Discharge: 2021-04-05 | Disposition: A | Discharge status: Home / Self Care | Payer: Medicare HMO | Source: Ambulatory Visit | Attending: Internal Medicine | Admitting: Internal Medicine

## 2021-04-05 DIAGNOSIS — E042 Nontoxic multinodular goiter: Secondary | ICD-10-CM | POA: Diagnosis not present

## 2021-04-05 DIAGNOSIS — E041 Nontoxic single thyroid nodule: Secondary | ICD-10-CM

## 2021-04-05 DIAGNOSIS — E0789 Other specified disorders of thyroid: Secondary | ICD-10-CM | POA: Diagnosis not present

## 2021-04-06 ENCOUNTER — Encounter: Payer: Self-pay | Admitting: Student

## 2021-04-06 NOTE — Progress Notes (Unsigned)
Things That May Be Affecting Your Health:  Alcohol  Hearing loss x Pain    Depression  Home Safety x Sexual Health   Diabetes  Lack of physical activity  Stress   Difficulty with daily activities  Loneliness  Tiredness   Drug use  Medicines  Tobacco use   Falls  Motor Vehicle Safety x Weight  x Food choices  Oral Health  Other    YOUR PERSONALIZED HEALTH PLAN : 1. Schedule your next subsequent Medicare Wellness visit in one year 2. Attend all of your regular appointments to address your medical issues 3. Complete the preventative screenings and services   Annual Wellness Visit   Medicare Covered Preventative Screenings and Services  Services & Screenings Men and Women Who How Often Need? Date of Last Service Action  Abdominal Aortic Aneurysm Adults with AAA risk factors Once      Alcohol Misuse and Counseling All Adults Screening once a year if no alcohol misuse. Counseling up to 4 face to face sessions. x Unknown   Bone Density Measurement  Adults at risk for osteoporosis Once every 2 yrs      Lipid Panel Z13.6 All adults without CV disease Once every 5 yrs       Colorectal Cancer  Stool sample or Colonoscopy All adults 50 and older  Once every year Every 10 years        Depression All Adults Once a year x Today   Diabetes Screening Blood glucose, post glucose load, or GTT Z13.1 All adults at risk Pre-diabetics Once per year Twice per year      Diabetes  Self-Management Training All adults Diabetics 10 hrs first year; 2 hours subsequent years. Requires Copay     Glaucoma Diabetics Family history of glaucoma African Americans 50 yrs + Hispanic Americans 65 yrs + Annually - requires coppay      Hepatitis C Z72.89 or F19.20 High Risk for HCV Born between 1945 and 1965 Annually Once      HIV Z11.4 All adults based on risk Annually btw ages 8 & 70 regardless of risk Annually > 65 yrs if at increased risk      Lung Cancer Screening Asymptomatic adults aged 50-77  with 30 pack yr history and current smoker OR quit within the last 15 yrs Annually Must have counseling and shared decision making documentation before first screen      Medical Nutrition Therapy Adults with  Diabetes Renal disease Kidney transplant within past 3 yrs 3 hours first year; 2 hours subsequent years     Obesity and Counseling All adults Screening once a year Counseling if BMI 30 or higher x Today   Tobacco Use Counseling Adults who use tobacco  Up to 8 visits in one year x    Vaccines Z23 Hepatitis B Influenza  Pneumonia  Adults  Once Once every flu season Two different vaccines separated by one year     Next Annual Wellness Visit People with Medicare Every year  Today     Services & Screenings Women Who How Often Need  Date of Last Service Action  Mammogram  Z12.31 Women over 40 One baseline ages 45-39. Annually ager 40 yrs+      Pap tests All women Annually if high risk. Every 2 yrs for normal risk women      Screening for cervical cancer with  Pap (Z01.419 nl or Z01.411abnl) & HPV Z11.51 Women aged 70 to 23 Once every 5 yrs  Screening pelvic and breast exams All women Annually if high risk. Every 2 yrs for normal risk women     Sexually Transmitted Diseases Chlamydia Gonorrhea Syphilis All at risk adults Annually for non pregnant females at increased risk         Services & Screenings Men Who How Ofter Need  Date of Last Service Action  Prostate Cancer - DRE & PSA Men over 50 Annually.  DRE might require a copay.        Sexually Transmitted Diseases Syphilis All at risk adults Annually for men at increased risk      Health Maintenance List Health Maintenance  Topic Date Due   COVID-19 Vaccine (2 - Pfizer risk series) 11/04/2019   TETANUS/TDAP  03/28/2028   INFLUENZA VACCINE  Completed   Hepatitis C Screening  Completed   HIV Screening  Completed   HPV VACCINES  Aged Out

## 2021-04-07 ENCOUNTER — Ambulatory Visit (INDEPENDENT_AMBULATORY_CARE_PROVIDER_SITE_OTHER): Payer: Medicare HMO | Admitting: Student

## 2021-04-07 ENCOUNTER — Encounter: Payer: Self-pay | Admitting: Student

## 2021-04-07 ENCOUNTER — Other Ambulatory Visit: Payer: Self-pay

## 2021-04-07 DIAGNOSIS — E041 Nontoxic single thyroid nodule: Secondary | ICD-10-CM

## 2021-04-07 DIAGNOSIS — I10 Essential (primary) hypertension: Secondary | ICD-10-CM | POA: Diagnosis not present

## 2021-04-07 DIAGNOSIS — H6692 Otitis media, unspecified, left ear: Secondary | ICD-10-CM

## 2021-04-07 NOTE — Assessment & Plan Note (Signed)
Patient here for follow-up on his blood pressure. BP of 158/94 still not at goal but has improved significantly over the last few months. Patient just loss grandmother 2 days ago states he has been little bit more stressed and anxious.  He denies any headaches, dizziness or blurry vision. States he feels much better on this current blood pressure medications. Patient not interested in taking too many medications. Will consider combo pill with a diuretic and next office visit if BP not at goal.  Kidney function remained normal.  Plan: -- Continue amlodipine 10 mg daily -- Continue metoprolol 50 mg twice a day -- Follow-up in 2 to 3 weeks for BP check and BMP -- Continue lifestyle modifications with exercise and dietary changes

## 2021-04-07 NOTE — Assessment & Plan Note (Signed)
BMI of 42.35 today. Patient states he continues to workout and has been trying to eat better. He was recently approved for semaglutide but has been apprehensive about using it because her sister who is a nurse informed him that it is a diabetic medication and can drop his blood sugar. Patient was reassured and counseled that semaglutide was studies on some patients without diabetes and did not hypoglycemic episodes.  Plan: --Start taking semaglutide 0.25 mg subcu once weekly --2-3-week follow-up to evaluate for side effects and effectiveness. --Continue lifestyle modifications with weight loss and exercise.

## 2021-04-07 NOTE — Progress Notes (Signed)
   CC: Follow-up  HPI:  [name] is a 40 y.o. male with PMH as below who presents to clinic for follow-up on his blood pressure and left ear infection. Please see problem based charting for evaluation, assessment and plan.  Past Medical History:  Diagnosis Date   Asthma    Childhood, not active   CHEST PAIN UNSPECIFIED 05/03/2010   Qualifier: Diagnosis of  By: Huntley Dec, Scott     ELECTROCARDIOGRAM, ABNORMAL 05/03/2010   Qualifier: Diagnosis of  By: Huntley Dec, Scott     Gout    Most recent flare 01/2018   HYPERLIPIDEMIA 05/03/2010   Qualifier: Diagnosis of  By: Huntley Dec, Scott     Hypertension    Keratoconus of both eyes    Legally blind     Review of Systems:  Constitutional: Negative for fever or fatigue Ears: Negative for ear pain or loss of hearing Respiratory: Negative for shortness of breath Cardiac: Negative for chest pain MSK: Negative for back pain Abdomen: Negative for abdominal pain, constipation or diarrhea Neuro: Negative for headache or weakness  Physical Exam: General: Pleasant, obese middle-age male. No acute distress. Ear: L ear: Tympanic membrane with mild erythema but no effusion. R ear: No TM abnormalities Cardiac: RRR. No murmurs, rubs or gallops. No LE edema Respiratory: Lungs CTAB. No wheezing or crackles. Abdominal: Soft, symmetric and non tender. Normal BS. Skin: Warm, dry and intact without rashes or lesions Extremities: Atraumatic. Full ROM. Palpable radial and DP pulses. Neuro: A&O x 3. Moves all extremities Psych: Appropriate mood and affect.  Vitals:   04/07/21 0933 04/07/21 1028  BP: (!) 158/94 (!) 158/105  Pulse: 64 68  Temp: 98.2 F (36.8 C)   TempSrc: Oral   SpO2: 98%   Weight: (!) 321 lb (145.6 kg)   Height: 6\' 1"  (1.854 m)       Assessment & Plan:   See Encounters Tab for problem based charting.  Patient discussed with Dr. , MD, MPH

## 2021-04-07 NOTE — Assessment & Plan Note (Signed)
Patient states he is anxious about the results of his thyroid aspiration. FNA done on 10/05. -- Pending surgical pathology results of FNA

## 2021-04-07 NOTE — Assessment & Plan Note (Signed)
Patient presents for follow-up after treatment for otitis media. Patient completed 7-day course of amoxicillin 500 mg twice daily. Patient reports that his ear pain has resolved and he can hear from his left ear. On exam, the tympanic membrane of the left ear is with mild erythema but no effusion and no auricular tenderness.  -- Patient advised to avoid putting Q-tips in ear

## 2021-04-07 NOTE — Patient Instructions (Addendum)
Thank you, [name] for allowing [place] to provide your care today. Today we discussed your blood pressure and ear infection. Your ears looks better.  I encourage you to continue taking your medications as prescribed.  We will consider another agent to help improve your blood pressure at your next follow-up appointment   My Chart Access: [email or web address]  Please follow-up in 3 weeks to see me for blood pressure follow-up  Please make sure to arrive 15 minutes prior to your next appointment. If you arrive late, you may be asked to reschedule.    We look forward to seeing you next time. Please call our clinic at [phone] if you have any questions or concerns. The best time to call is Monday-Friday from 9am-4pm, but there is someone available 24/7. If after hours or the weekend, call the main hospital number and ask for the Internal Medicine Resident On-Call. If you need medication refills, please notify your pharmacy one week in advance and they will send [place] a request.   Thank you for letting us take part in your care. Wishing you the best!  Steffanie Rainwater, MD 04/07/2021, 9:58 AM IM Resident, PGY-2 [name] 41:10

## 2021-04-10 LAB — CYTOLOGY - NON PAP

## 2021-04-10 NOTE — Progress Notes (Signed)
Internal Medicine Clinic Attending  Case discussed with Dr. Amponsah  At the time of the visit.  We reviewed the resident's history and exam and pertinent patient test results.  I agree with the assessment, diagnosis, and plan of care documented in the resident's note.  

## 2021-04-12 ENCOUNTER — Other Ambulatory Visit: Payer: Self-pay | Admitting: Student

## 2021-04-12 ENCOUNTER — Telehealth: Payer: Self-pay

## 2021-04-12 MED ORDER — LORATADINE 10 MG PO TABS: 10.0000 mg | ORAL_TABLET | Freq: Every day | ORAL | 2 refills | Status: DC | PRN

## 2021-04-12 NOTE — Progress Notes (Signed)
Discussed the results of recent thyroid biopsy with patient. Patient reported a history of thyroid cancer in uncle and aunt.  He will need close monitoring of his thyroid with serial ultrasounds and likely FNAs.  Patient asked for refills on his loratadine and they are being sent to his pharmacy.

## 2021-04-12 NOTE — Telephone Encounter (Signed)
Requesting lab results, please call pt back.  

## 2021-04-13 ENCOUNTER — Ambulatory Visit: Payer: Medicare HMO | Admitting: Behavioral Health

## 2021-04-13 DIAGNOSIS — F331 Major depressive disorder, recurrent, moderate: Secondary | ICD-10-CM

## 2021-04-13 DIAGNOSIS — E041 Nontoxic single thyroid nodule: Secondary | ICD-10-CM | POA: Diagnosis not present

## 2021-04-13 DIAGNOSIS — F419 Anxiety disorder, unspecified: Secondary | ICD-10-CM

## 2021-04-13 NOTE — BH Specialist Note (Signed)
Integrated Behavioral Health via Telemedicine Visit  04/13/2021 BERNADETTE ARMIJO [id number]  Number of Integrated Behavioral Health visits: 7 Session Start time: 2:00pm  Session End time: 2:30pm Total time: 30  Referring Provider: Dr. Corinna Gab, MD Patient/Family location: Pt is in private [facility] Provider location: [facility] Office All persons participating in visit: Pt & Clinician Types of Service: Individual psychotherapy  I connected with [name] and/or [name] Spradley's  self  via  Telephone or Video Enabled Telemedicine Application  (Video is Caregility application) and verified that I am speaking with the correct person using two identifiers. Discussed confidentiality:  6th visit  I discussed the limitations of telemedicine and the availability of in person appointments.  Discussed there is a possibility of technology failure and discussed alternative modes of communication if that failure occurs.  I discussed that engaging in this telemedicine visit, they consent to the provision of behavioral healthcare and the services will be billed under their insurance.  Patient and/or legal guardian expressed understanding and consented to Telemedicine visit:  6th visit  Presenting Concerns: Patient and/or family reports the following symptoms/concerns: cont'd reduction in anx/dep Duration of problem: months; Severity of problem: mild  Patient and/or Family's Strengths/Protective Factors: Social connections, Social and Emotional competence, Concrete supports in place (healthy food, safe environments, etc.), and Sense of purpose  Goals Addressed: Patient will:  Reduce symptoms of: anxiety and depression   Increase knowledge and/or ability of: coping skills and healthy habits   Demonstrate ability to: Increase healthy adjustment to current life circumstances, begin healthy grieving over loss of Pat. GM  Progress towards Goals: Ongoing  Interventions: Interventions utilized:   Supportive Counseling, Supportive Reflection, and [facility] approach Standardized Assessments completed:  screeners prn  Patient and/or Family Response: Pt receptive to visit today & requests future appts  Assessment: Patient currently experiencing cont'd improvement in mental health wellness. He has stopped smoking now for 4 days. The death of his paternal GM has not thrown him intensely. He has handled death differently this time-improved functioning.  Pt sts he is feeling much improved; no anger or agitation. He sees these qualities in others & recognizes he is not burdened by these earthly issues.  Patient may benefit from cont'd 30 min ck-ins for his mental health wellness.  Plan: Follow up with behavioral health clinician on : 2-3 wks for 30 min on telehealth Behavioral recommendations: Keep your positivity going Referral(s): Integrated [employer] (In Clinic)  I discussed the assessment and treatment plan with the patient and/or parent/guardian. They were provided an opportunity to ask questions and all were answered. They agreed with the plan and demonstrated an understanding of the instructions.   They were advised to call back or seek an in-person evaluation if the symptoms worsen or if the condition fails to improve as anticipated.  Deneise Lever, LMFT

## 2021-04-17 ENCOUNTER — Other Ambulatory Visit: Payer: Self-pay | Admitting: Student

## 2021-04-27 ENCOUNTER — Other Ambulatory Visit: Payer: Self-pay

## 2021-04-27 ENCOUNTER — Ambulatory Visit (INDEPENDENT_AMBULATORY_CARE_PROVIDER_SITE_OTHER): Payer: Medicare HMO | Admitting: Student

## 2021-04-27 ENCOUNTER — Encounter: Payer: Self-pay | Admitting: Student

## 2021-04-27 DIAGNOSIS — H6692 Otitis media, unspecified, left ear: Secondary | ICD-10-CM | POA: Diagnosis not present

## 2021-04-27 DIAGNOSIS — I10 Essential (primary) hypertension: Secondary | ICD-10-CM

## 2021-04-27 NOTE — Patient Instructions (Signed)
Thank you, [name] for allowing [place] to provide your care today. Today we discussed your blood pressure, diabetes, ear infection and weight.  Continue taking all your medications as prescribed.   My Chart Access: [email or web address]  Please follow-up in 4 weeks for BP and diabetes follow-up  Please make sure to arrive 15 minutes prior to your next appointment. If you arrive late, you may be asked to reschedule.    We look forward to seeing you next time. Please call our clinic at [phone] if you have any questions or concerns. The best time to call is Monday-Friday from 9am-4pm, but there is someone available 24/7. If after hours or the weekend, call the main hospital number and ask for the Internal Medicine Resident On-Call. If you need medication refills, please notify your pharmacy one week in advance and they will send [place] a request.   Thank you for letting us take part in your care. Wishing you the best!  Steffanie Rainwater, MD 04/27/2021, 10:41 AM IM Resident, PGY-2 [name] 41:10

## 2021-04-27 NOTE — Progress Notes (Signed)
   CC: BP follow-up  HPI:  Samuel Decker is a 40 y.o. male with PMH as below who presents to the clinic for follow-up on his chronic medical problems. Please see problem based charting for evaluation, assessment and plan.  Past Medical History:  Diagnosis Date   Asthma    Childhood, not active   CHEST PAIN UNSPECIFIED 05/03/2010   Qualifier: Diagnosis of  By: Huntley Dec, Scott     ELECTROCARDIOGRAM, ABNORMAL 05/03/2010   Qualifier: Diagnosis of  By: Huntley Dec, Scott     Gout    Most recent flare 01/2018   HYPERLIPIDEMIA 05/03/2010   Qualifier: Diagnosis of  By: Huntley Dec, Scott     Hypertension    Keratoconus of both eyes    Legally blind     Review of Systems:  Constitutional: Negative for fever or fatigue Eyes: Negative for visual changes Respiratory: Negative for shortness of breath Cardiac: Negative for chest pain MSK: Positive for chronic back pain Abdomen: Negative for abdominal pain, constipation or diarrhea Neuro: Negative for headache or weakness  Physical Exam: General: Pleasant, well-appearing middle-age man. No acute distress. Ear: L ear with mild clear drainage. No erythema or effusion. Cardiac: RRR. No murmurs, rubs or gallops. No LE edema Respiratory: Lungs CTAB. No wheezing or crackles. Abdominal: Soft, symmetric and non tender. Normal BS. Skin: Warm, dry and intact without rashes or lesions Extremities: Atraumatic. Full ROM. Palpable radial and DP pulses. Neuro: A&O x 3. Moves all extremities Psych: Appropriate mood and affect.  Vitals:   04/27/21 0953  BP: (!) 141/80  Pulse: 69  Temp: 98.2 F (36.8 C)  TempSrc: Oral  SpO2: 100%  Weight: (!) 321 lb (145.6 kg)  Height: 6\' 1"  (1.854 m)     Assessment & Plan:   See Encounters Tab for problem based charting.  Patient discussed with Dr. , MD, MPH

## 2021-04-28 ENCOUNTER — Encounter: Payer: Self-pay | Admitting: Student

## 2021-04-28 NOTE — Assessment & Plan Note (Signed)
Patient here for follow-up on his blood pressure.  BP was elevated with systolic in the 150s last visit due to his grandmother passing away few days prior to the visit.  BP today is improved to 141/80. Patient reports that he is less stressed this week. He denies any headaches, dizziness or blurry vision. Patient reports that he does not do well with combo blood pressure pills and has not tolerated losartan in the past. He also does not want to take more than 2 blood pressure medications.  In the setting of these restrictions, we will continue patient on the current regimen and monitor for improvement as she loses weight while on Ozempic.  Plan: -- Continue amlodipine 10 mg daily -- Continue metoprolol 50 mg twice daily -- Follow-up in 4 weeks for BP check and BMP

## 2021-04-28 NOTE — Progress Notes (Signed)
Internal Medicine Clinic Attending  Case discussed with Dr. Amponsah  At the time of the visit.  We reviewed the resident's history and exam and pertinent patient test results.  I agree with the assessment, diagnosis, and plan of care documented in the resident's note.  

## 2021-04-28 NOTE — Assessment & Plan Note (Signed)
Patient reports that he did not start injecting his Ozempic until this week because he was afraid of the side effects. Patient was educated on the effectiveness of this medication for weight loss and the side effects to expect that will improve over time. Patient agreeable to plan to continue Ozempic at the current dose and escalate dose in 4 weeks.  Patient's weight has not changed from last visit.  Plan: -- Continue Ozempic 0.25 mg subcu once weekly -- Encouraged to continue dietary changes and exercise -- Follow-up in 4 weeks for monitoring and dose escalation of Ozempic.

## 2021-04-28 NOTE — Assessment & Plan Note (Signed)
Symptoms have improved per patient.  No ear pain, fullness or fever.  He does report occasional clear drainage from left ear. On exam he does have minimal clear drainage in the left ear.  No recent pool exposure. Patient advised to avoid moisture in his ear as much as possible. -- Continue to monitor for complete resolution

## 2021-05-04 ENCOUNTER — Ambulatory Visit: Payer: Medicare HMO | Admitting: Behavioral Health

## 2021-05-04 DIAGNOSIS — F419 Anxiety disorder, unspecified: Secondary | ICD-10-CM

## 2021-05-04 DIAGNOSIS — F331 Major depressive disorder, recurrent, moderate: Secondary | ICD-10-CM

## 2021-05-04 NOTE — BH Specialist Note (Addendum)
Integrated Behavioral Health via Telemedicine Visit  05/04/2021 Samuel Decker [id number]  Number of Integrated Behavioral Health visits: 8 Session Start time: 1:30pm  Session End time: 2:00pm Total time: 30  Referring Provider: Dr. Corinna Gab, MD Patient/Family location: Pt is in his car alone [facility] Provider location: [facility] Office All persons participating in visit: Pt & Clinician Types of Service: Individual psychotherapy  I connected with [name] and/or [name] Auth's  self  via  Telephone or Video Enabled Telemedicine Application  (Video is Caregility application) and verified that I am speaking with the correct person using two identifiers. Discussed confidentiality:  8th visit  I discussed the limitations of telemedicine and the availability of in person appointments.  Discussed there is a possibility of technology failure and discussed alternative modes of communication if that failure occurs.  I discussed that engaging in this telemedicine visit, they consent to the provision of behavioral healthcare and the services will be billed under their insurance.  Patient and/or legal guardian expressed understanding and consented to Telemedicine visit:  8th visit  Presenting Concerns: Patient and/or family reports the following symptoms/concerns: cont'd reduction in anx/dep Sx; last noticed dep Sx when GM died on 04-06-2021 Duration of problem: months; Severity of problem: mild  Patient and/or Family's Strengths/Protective Factors: Social and Emotional competence, Concrete supports in place (healthy food, safe environments, etc.), Sense of purpose, and Physical Health (exercise, healthy diet, medication compliance, etc.)  Pt has upcoming [employer] through his [place].  Goals Addressed: Patient will:  Reduce symptoms of: anxiety, depression, and grief rxn    Increase knowledge and/or ability of: coping skills and healthy habits   Demonstrate  ability to: Increase healthy adjustment to current life circumstances and Increase motivation to adhere to plan of care  Progress towards Goals: Ongoing  Interventions: Interventions utilized:  Solution-Focused Strategies, Behavioral Activation, and Supportive Counseling Standardized Assessments completed:  screeners prn  Patient and/or Family Response: Pt receptive to call today & requests future ck-in appts  Assessment: Patient currently experiencing dec in anx/dep & sadness. Pt is more accepting of his GM's death as time progresses.  Patient may benefit from cont'd attn to his Sx & his coping & the progress he is making.  Plan: Follow up with behavioral health clinician on : 2-3 wks on telehealth for 30 min ck-in. Behavioral recommendations: Cont to monitor your spirituality & attn to your progress w/cigarettes, THC & diet Referral(s): Integrated [employer] (In Clinic)  I discussed the assessment and treatment plan with the patient and/or parent/guardian. They were provided an opportunity to ask questions and all were answered. They agreed with the plan and demonstrated an understanding of the instructions.   They were advised to call back or seek an in-person evaluation if the symptoms worsen or if the condition fails to improve as anticipated.  Deneise Lever, LMFT

## 2021-05-12 ENCOUNTER — Other Ambulatory Visit: Payer: Self-pay | Admitting: Student

## 2021-05-18 ENCOUNTER — Telehealth: Payer: Self-pay

## 2021-05-28 ENCOUNTER — Other Ambulatory Visit: Payer: Self-pay | Admitting: Student

## 2021-05-29 ENCOUNTER — Telehealth: Payer: Self-pay | Admitting: Behavioral Health

## 2021-05-29 ENCOUNTER — Ambulatory Visit: Payer: Medicare HMO | Admitting: Behavioral Health

## 2021-05-29 NOTE — Telephone Encounter (Signed)
Unable to lv msg for Pt re: 10:30am telehealth visit. VM Box is full. Tried Pt's phone twice.  Dr. Monna Fam

## 2021-06-01 ENCOUNTER — Telehealth: Payer: Self-pay

## 2021-06-01 NOTE — Telephone Encounter (Signed)
Return pt's call - asked about the needle with Ozempic. Informed needles are included with the pens. Informed pt he should inject 0.25 mg. And for any other questions to ask his pharmacist.

## 2021-06-01 NOTE — Telephone Encounter (Signed)
Questions about medication. Please call pt back.  

## 2021-06-07 ENCOUNTER — Encounter: Payer: Medicare HMO | Admitting: Student

## 2021-06-08 ENCOUNTER — Other Ambulatory Visit: Payer: Self-pay

## 2021-06-08 ENCOUNTER — Encounter: Payer: Self-pay | Admitting: Internal Medicine

## 2021-06-08 ENCOUNTER — Ambulatory Visit (INDEPENDENT_AMBULATORY_CARE_PROVIDER_SITE_OTHER): Payer: Medicare HMO | Admitting: Internal Medicine

## 2021-06-08 VITALS — BP 143/86 | HR 80 | Temp 98.3°F | Ht 73.0 in | Wt 315.5 lb

## 2021-06-08 DIAGNOSIS — M7632 Iliotibial band syndrome, left leg: Secondary | ICD-10-CM | POA: Diagnosis not present

## 2021-06-08 DIAGNOSIS — I10 Essential (primary) hypertension: Secondary | ICD-10-CM

## 2021-06-08 DIAGNOSIS — H6090 Unspecified otitis externa, unspecified ear: Secondary | ICD-10-CM | POA: Insufficient documentation

## 2021-06-08 DIAGNOSIS — H60502 Unspecified acute noninfective otitis externa, left ear: Secondary | ICD-10-CM | POA: Diagnosis not present

## 2021-06-08 DIAGNOSIS — Z87898 Personal history of other specified conditions: Secondary | ICD-10-CM | POA: Diagnosis not present

## 2021-06-08 DIAGNOSIS — H66005 Acute suppurative otitis media without spontaneous rupture of ear drum, recurrent, left ear: Secondary | ICD-10-CM

## 2021-06-08 DIAGNOSIS — E782 Mixed hyperlipidemia: Secondary | ICD-10-CM | POA: Diagnosis not present

## 2021-06-08 MED ORDER — LEVOFLOXACIN 500 MG PO TABS: 500.0000 mg | ORAL_TABLET | Freq: Every day | ORAL | 0 refills | Status: AC

## 2021-06-08 MED ORDER — AMLODIPINE-ATORVASTATIN 10-80 MG PO TABS: 1.0000 | ORAL_TABLET | Freq: Every day | ORAL | 2 refills | Status: DC

## 2021-06-08 MED ORDER — SEMAGLUTIDE(0.25 OR 0.5MG/DOS) 2 MG/1.5ML ~~LOC~~ SOPN: 0.2500 mg | PEN_INJECTOR | SUBCUTANEOUS | 0 refills | Status: DC

## 2021-06-08 MED ORDER — BENAZEPRIL HCL 10 MG PO TABS: 10.0000 mg | ORAL_TABLET | Freq: Every day | ORAL | 11 refills | Status: DC

## 2021-06-08 MED ORDER — SEMAGLUTIDE(0.25 OR 0.5MG/DOS) 2 MG/1.5ML ~~LOC~~ SOPN: 0.5000 mg | PEN_INJECTOR | SUBCUTANEOUS | 2 refills | Status: DC

## 2021-06-08 MED ORDER — LOSARTAN POTASSIUM 25 MG PO TABS: 25.0000 mg | ORAL_TABLET | Freq: Every day | ORAL | 2 refills | Status: DC

## 2021-06-08 NOTE — Progress Notes (Signed)
Subjective:  CC: HTN  HPI:  [name] is a 40 y.o. male with a past medical history stated below and presents today for HTN. Please see problem based assessment and plan for additional details.  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     Current Outpatient Medications on File Prior to Visit  Medication Sig Dispense Refill   aspirin 81 MG chewable tablet Chew 1 tablet (81 mg total) by mouth daily. 90 tablet 3   fluticasone (FLONASE) 50 MCG/ACT nasal spray Place 1 spray into both nostrils daily. 1 g 0   ibuprofen (ADVIL) 600 MG tablet Take 1 tablet (600 mg total) by mouth every 6 (six) hours as needed. 30 tablet 0   loratadine (CLARITIN) 10 MG tablet Take 1 tablet (10 mg total) by mouth daily as needed for allergies. 30 tablet 2   metoprolol tartrate (LOPRESSOR) 50 MG tablet Take 1 tablet (50 mg total) by mouth 2 (two) times daily. 180 tablet 3   polyethylene glycol (MIRALAX) 17 g packet Take 17 g by mouth daily. 14 each 0   No current facility-administered medications on file prior to visit.    Family History  Problem Relation Age of Onset   Hypertension Mother    Hypertension Father    Diabetes Father    Heart disease Father    Hypertension Paternal Grandmother    Diabetes Paternal Grandmother     Social History   Socioeconomic History   Marital status: Married    Spouse name: Not on file   Number of children: Not on file   Years of education: Not on file   Highest education level: Not on file  Occupational  History   Occupation: diability  Tobacco Use   Smoking status: Every Day    Packs/day: 0.50    Years: 3.00    Pack years: 1.50    Types: Cigarettes   Smokeless tobacco: Never   Tobacco comments:    No smoking in 1.5 days   Vaping Use   Vaping Use: Every day  Substance and Sexual Activity   Alcohol use: Yes    Alcohol/week: 6.0 standard drinks    Types: 6 Shots of liquor per week    Comment: occ   Drug use: No   Sexual activity: Not on file  Other Topics Concern   Not on file  Social History Narrative   Not on file   Social Determinants of Health   Financial Resource Strain: Not on file  Food Insecurity: Not on file  Transportation Needs: Not on file  Physical Activity: Not on file  Stress: Not on file  Social Connections: Not on file  Intimate Partner Violence: Not on file    Review of Systems: ROS negative except for what is noted on the assessment and plan.  Objective:   Vitals:   06/08/21 1039 06/08/21 1046  BP: (!) 160/82 (!) 143/86  Pulse: 72 80  Temp: 98.3 F (36.8 C)   TempSrc: Oral   SpO2: 100%   Weight: (!) 315 lb 8 oz (143.1 kg)   Height: 6\' 1"  (1.854 m)     Physical Exam: Gen: A&O x3 and in no apparent distress, well appearing and nourished. HEENT:    Head - normocephalic, atraumatic.    Eye - visual acuity grossly intact, conjunctiva clear, sclera non-icteric, EOM intact.    Mouth - No obvious caries or periodontal disease.   Ear - left purulent drainage with intact tympanic membrane  Neck: no masses or nodules, AROM intact. CV: RRR, no murmurs, S1/S2 presents  Resp: Clear to ascultation bilaterally  Abd: BS (+) x4, soft, non-tender abdomen, without hepatosplenomegaly or masses MSK: Grossly normal AROM and strength x4 extremities. Lateral knee pain and lateral hip pain over the tensor fascia lata. Skin: good skin turgor, no rashes, unusual bruising, or prominent lesions.  Neuro: No focal deficits, grossly normal sensation and coordination.     Assessment & Plan:  See Encounters Tab for problem based charting.  Patient discussed with Dr. , D.O. [facility]  [id number] Pager: [phone]  Phone: [phone] Date 06/09/2021  Time 8:08 AM

## 2021-06-08 NOTE — Patient Instructions (Signed)
Thank you, Mr.Arty D Kilman for allowing [place] to provide your care today. Today we discussed Blood pressure, cholesterol, obesity.    Labs/Tests Ordered: Lab Orders         BMP8+Anion Gap      Referrals Ordered:  Referral Orders  No referral(s) requested today     Medication Changes:  Medications Discontinued During This Encounter  Medication Reason   amLODipine (NORVASC) 10 MG tablet    atorvastatin (LIPITOR) 40 MG tablet    Semaglutide,0.25 or 0.5MG /DOS, 2 MG/1.5ML SOPN Reorder     Meds ordered this encounter  Medications   amLODipine-atorvastatin (CADUET) 10-80 MG tablet    Sig: Take 1 tablet by mouth daily.    Dispense:  30 tablet    Refill:  2   losartan (COZAAR) 25 MG tablet    Sig: Take 1 tablet (25 mg total) by mouth daily.    Dispense:  30 tablet    Refill:  2   levofloxacin (LEVAQUIN) 500 MG tablet    Sig: Take 1 tablet (500 mg total) by mouth daily for 10 days.    Dispense:  10 tablet    Refill:  0   Semaglutide,0.25 or 0.5MG /DOS, 2 MG/1.5ML SOPN    Sig: Inject 0.25 mg into the skin once a week.    Dispense:  1.5 mL    Refill:  0     Health Maintenance Screening: There are no preventive care reminders to display for this patient.   Instructions:   Follow up:  1 month    Remember: If you have any questions or concerns, call our clinic at [phone] or after hours call [phone] and ask for the internal medicine resident on call.  Dellia Cloud, D.O. [facility]

## 2021-06-09 ENCOUNTER — Encounter: Payer: Self-pay | Admitting: Internal Medicine

## 2021-06-09 DIAGNOSIS — M7630 Iliotibial band syndrome, unspecified leg: Secondary | ICD-10-CM | POA: Insufficient documentation

## 2021-06-09 DIAGNOSIS — H6690 Otitis media, unspecified, unspecified ear: Secondary | ICD-10-CM | POA: Insufficient documentation

## 2021-06-09 DIAGNOSIS — H6092 Unspecified otitis externa, left ear: Secondary | ICD-10-CM | POA: Insufficient documentation

## 2021-06-09 LAB — BMP8+ANION GAP
Anion Gap: 15 mmol/L (ref 10.0–18.0)
BUN/Creatinine Ratio: 8 — ABNORMAL LOW (ref 9–20)
BUN: 10 mg/dL (ref 6–20)
CO2: 21 mmol/L (ref 20–29)
Calcium: 9.1 mg/dL (ref 8.7–10.2)
Chloride: 104 mmol/L (ref 96–106)
Creatinine, Ser: 1.21 mg/dL (ref 0.76–1.27)
Glucose: 99 mg/dL (ref 70–99)
Potassium: 4.4 mmol/L (ref 3.5–5.2)
Sodium: 140 mmol/L (ref 134–144)
eGFR: 79 mL/min/{1.73_m2} (ref >59)

## 2021-06-09 NOTE — Assessment & Plan Note (Signed)
Increased dose of semaglutide to 0.5 mg daily. He denies any side effects. Endorses 7 lb weight loss (321>315lbs)

## 2021-06-09 NOTE — Assessment & Plan Note (Addendum)
Patient presents with likely recurrent suppurative otitis media. He has ear pain and fullness with purulent drainage. He was previously treated with amoxicillin without improvement of his symptoms.   Plan: - Course of Levofloxicin  - ENT if symptoms do not improve.

## 2021-06-09 NOTE — Assessment & Plan Note (Signed)
Patient presents with pain on the lateral aspect of his knee for 1 month. He denies any trauma to the area. He endorses tight hip flexors with pain on his lateral thigh particularly with hip flexion.   On exam he has tight/sore illiopsoas muscles and TTP of the proximal and distal tensor fascia lata. He does have some mild discomfort with Varus pressure on his knee but all other knee PE findings were negative.   Assessment/Plan: ITB Syndrome: - Counseled regarding stretches and exercises - Recommended OTC pain medicine - Offered physical therapy.

## 2021-06-09 NOTE — Assessment & Plan Note (Signed)
Assessment: Patient presents today for further evaluation and management of his HTN.   His blood pressure is uncontrolled on the following medications:  1. Metoprolol 50 mg BID 2. Amlodipine 10  He admits to being adherent with his antihypertensive medications.   He identifies the following barriers: none.   He admits to no side effects noted.   He is currently taking the following medications, which could be contributing to his HTN: none  He has the following underlying comorbidities that could affect his HTN treatment: none. dyslipidemia and family history of premature cardiovascular disease  Current exercise regimen: unknown  Diet habits: unknown  Checking BP at home: No , unsure if he has a BP cuff  Recent blood pressure are listed below:  Blood Pressure 06/08/2021 04/27/2021 04/07/2021 03/24/2021 03/10/2021  BP 143/86 141/80 158/105 156/91 165/96  Some recent data might be hidden    Current metabolic panels:  BMP Latest Ref Rng & Units 11/09/2020 10/19/2020 11/23/2019  Glucose 65 - 99 mg/dL 87 94 695(Q)  BUN 6 - 20 mg/dL 18 10 12   Creatinine 0.76 - 1.27 mg/dL 7.22 5.75  BUN/Creat Ratio 9 - 20 16 9 10   Sodium 134 - 144 mmol/L 137 141 139  Potassium 3.5 - 5.2 mmol/L 4.3 4.5 4.4  Chloride 96 - 106 mmol/L 100 103 104  CO2 20 - 29 mmol/L 24 17(L) 20  Calcium 8.7 - 10.2 mg/dL 9.7 9.5 9.2     Plan: 1. Start benzapril 10 mg daily, Cont Metoprolol 50 mg BID and amlodipine 10 mg daily 2. Goal BP: <130/80 3.  Will need a BP cuff in the future. 4. Recommended DASH diet, maintaining < 2.3 g/day Salt intake, and limiting ETOH use (< 2 drinks/day for women and < 3 drinks/day for Men) 5. Recommended Weight loss & aerobic exercise  (at least 150 minutes of moderate-intensity - 30 minutes for 5+ days/week)

## 2021-06-09 NOTE — Assessment & Plan Note (Signed)
Increased atorvastatin to 80 mg today to achieve a goal LDL of <70 do to high risk for CV disease at a young age and significant family history.    Lipid Panel     Component Value Date/Time   CHOL 217 (H) 10/19/2020 1103   TRIG 175 (H) 10/19/2020 1103   HDL 37 (L) 10/19/2020 1103   CHOLHDL 5.9 (H) 10/19/2020 1103   CHOLHDL 6.3 02/12/2018 1826   VLDL 38 02/12/2018 1826   LDLCALC 148 (H) 10/19/2020 1103   LABVLDL 32 10/19/2020 1103

## 2021-06-10 ENCOUNTER — Other Ambulatory Visit: Payer: Self-pay | Admitting: Internal Medicine

## 2021-06-12 NOTE — Progress Notes (Signed)
Internal Medicine Clinic Attending  Case discussed with Dr. Steen  At the time of the visit.  We reviewed the resident's history and exam and pertinent patient test results.  I agree with the assessment, diagnosis, and plan of care documented in the resident's note.  

## 2021-06-28 ENCOUNTER — Telehealth: Payer: Self-pay

## 2021-06-28 NOTE — Telephone Encounter (Signed)
Agree with plan for evaluation

## 2021-06-28 NOTE — Telephone Encounter (Signed)
Pt states he has ear infection for 3 months, and now it's bleeding. Requesting the nurse to call him back.

## 2021-06-28 NOTE — Telephone Encounter (Signed)
Return pt's call who stated he has 2 problems. First, he thinks his ear is still infected even after 2 rounds of abx's; there's white pus draining from his ear. Second, he thinks bell's palsy is returning' he's having some facial numbness. No available appts until next week. Advised pt to go to UC - stated "ok". Thanks

## 2021-06-29 ENCOUNTER — Emergency Department (HOSPITAL_BASED_OUTPATIENT_CLINIC_OR_DEPARTMENT_OTHER)
Admission: EM | Admit: 2021-06-29 | Discharge: 2021-06-29 | Disposition: A | Discharge status: Home / Self Care | Payer: Medicare HMO | Attending: Emergency Medicine | Admitting: Emergency Medicine

## 2021-06-29 ENCOUNTER — Encounter (HOSPITAL_BASED_OUTPATIENT_CLINIC_OR_DEPARTMENT_OTHER): Payer: Self-pay

## 2021-06-29 ENCOUNTER — Other Ambulatory Visit: Payer: Self-pay

## 2021-06-29 ENCOUNTER — Emergency Department (HOSPITAL_BASED_OUTPATIENT_CLINIC_OR_DEPARTMENT_OTHER): Payer: Medicare HMO

## 2021-06-29 DIAGNOSIS — R29810 Facial weakness: Secondary | ICD-10-CM | POA: Diagnosis not present

## 2021-06-29 DIAGNOSIS — Z79899 Other long term (current) drug therapy: Secondary | ICD-10-CM | POA: Insufficient documentation

## 2021-06-29 DIAGNOSIS — H9202 Otalgia, left ear: Secondary | ICD-10-CM | POA: Diagnosis present

## 2021-06-29 DIAGNOSIS — R69 Illness, unspecified: Secondary | ICD-10-CM | POA: Diagnosis not present

## 2021-06-29 DIAGNOSIS — H7092 Unspecified mastoiditis, left ear: Secondary | ICD-10-CM | POA: Diagnosis not present

## 2021-06-29 DIAGNOSIS — J45909 Unspecified asthma, uncomplicated: Secondary | ICD-10-CM | POA: Insufficient documentation

## 2021-06-29 DIAGNOSIS — G510 Bell's palsy: Secondary | ICD-10-CM | POA: Insufficient documentation

## 2021-06-29 DIAGNOSIS — H6092 Unspecified otitis externa, left ear: Secondary | ICD-10-CM | POA: Diagnosis not present

## 2021-06-29 DIAGNOSIS — H60502 Unspecified acute noninfective otitis externa, left ear: Secondary | ICD-10-CM | POA: Diagnosis not present

## 2021-06-29 DIAGNOSIS — I10 Essential (primary) hypertension: Secondary | ICD-10-CM | POA: Diagnosis not present

## 2021-06-29 DIAGNOSIS — F17210 Nicotine dependence, cigarettes, uncomplicated: Secondary | ICD-10-CM | POA: Diagnosis not present

## 2021-06-29 DIAGNOSIS — J3489 Other specified disorders of nose and nasal sinuses: Secondary | ICD-10-CM | POA: Diagnosis not present

## 2021-06-29 LAB — CBC WITH DIFFERENTIAL/PLATELET
Abs Immature Granulocytes: 0.03 K/uL (ref 0.00–0.07)
Basophils Absolute: 0 K/uL (ref 0.0–0.1)
Basophils Relative: 0 %
Eosinophils Absolute: 0.3 K/uL (ref 0.0–0.5)
Eosinophils Relative: 3 %
HCT: 47.1 % (ref 39.0–52.0)
Hemoglobin: 15.7 g/dL (ref 13.0–17.0)
Immature Granulocytes: 0 %
Lymphocytes Relative: 32 %
Lymphs Abs: 3.3 K/uL (ref 0.7–4.0)
MCH: 28.3 pg (ref 26.0–34.0)
MCHC: 33.3 g/dL (ref 30.0–36.0)
MCV: 84.9 fL (ref 80.0–100.0)
Monocytes Absolute: 0.7 K/uL (ref 0.1–1.0)
Monocytes Relative: 7 %
Neutro Abs: 5.9 K/uL (ref 1.7–7.7)
Neutrophils Relative %: 58 %
Platelets: 342 K/uL (ref 150–400)
RBC: 5.55 MIL/uL (ref 4.22–5.81)
RDW: 14 % (ref 11.5–15.5)
WBC: 10.3 K/uL (ref 4.0–10.5)
nRBC: 0 % (ref 0.0–0.2)

## 2021-06-29 LAB — COMPREHENSIVE METABOLIC PANEL WITH GFR
ALT: 48 U/L — ABNORMAL HIGH (ref 0–44)
AST: 29 U/L (ref 15–41)
Albumin: 4.2 g/dL (ref 3.5–5.0)
Alkaline Phosphatase: 104 U/L (ref 38–126)
Anion gap: 8 (ref 5–15)
BUN: 12 mg/dL (ref 6–20)
CO2: 24 mmol/L (ref 22–32)
Calcium: 8.9 mg/dL (ref 8.9–10.3)
Chloride: 102 mmol/L (ref 98–111)
Creatinine, Ser: 1.25 mg/dL — ABNORMAL HIGH (ref 0.61–1.24)
GFR, Estimated: >60 mL/min
Glucose, Bld: 88 mg/dL (ref 70–99)
Potassium: 4.5 mmol/L (ref 3.5–5.1)
Sodium: 134 mmol/L — ABNORMAL LOW (ref 135–145)
Total Bilirubin: 0.4 mg/dL (ref 0.3–1.2)
Total Protein: 7.9 g/dL (ref 6.5–8.1)

## 2021-06-29 MED ORDER — OFLOXACIN 0.3 % OP SOLN
5.0000 [drp] | Freq: Every day | OPHTHALMIC | Status: DC
  Administered 2021-06-29: 18:00:00 5 [drp] via OTIC
  Filled 2021-06-29: qty 5

## 2021-06-29 MED ORDER — IOHEXOL 300 MG/ML  SOLN
75.0000 mL | Freq: Once | INTRAMUSCULAR | Status: AC | PRN
  Administered 2021-06-29: 17:00:00 75 mL via INTRAVENOUS

## 2021-06-29 MED ORDER — PREDNISONE 10 MG PO TABS: 60.0000 mg | ORAL_TABLET | Freq: Every day | ORAL | 0 refills | Status: AC

## 2021-06-29 MED ORDER — VALACYCLOVIR HCL 1 G PO TABS: 1000.0000 mg | ORAL_TABLET | Freq: Three times a day (TID) | ORAL | 0 refills | Status: AC

## 2021-06-29 MED ORDER — PREDNISONE 50 MG PO TABS
60.0000 mg | ORAL_TABLET | Freq: Once | ORAL | Status: AC
  Administered 2021-06-29: 18:00:00 60 mg via ORAL
  Filled 2021-06-29: qty 1

## 2021-06-29 MED ORDER — OFLOXACIN 0.3 % OT SOLN: 5.0000 [drp] | Freq: Two times a day (BID) | OTIC | 0 refills | Status: AC

## 2021-06-29 NOTE — ED Notes (Signed)
Pt. In CT, will update vitals when he returns

## 2021-06-29 NOTE — ED Provider Notes (Signed)
[facility] EMERGENCY DEPARTMENT Provider Note   CSN: [id number] Arrival date & time: 06/29/21  1252     History Chief Complaint  Patient presents with   Facial Droop   Otalgia    Samuel Decker is a 40 y.o. male.  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  Patient states he has been dealing with a left-sided ear infection for the past 2 to 3 months.  States he was treated with a course of amoxicillin as well as Levaquin with no improvement in his symptoms.  Reports pain and white discharge from the left ear.  No URI symptoms.  He states that he has seasonal allergies and blew his nose yesterday and notes bloody mucus.  Denies any recurrence of this.  States that yesterday he also began experiencing left-sided facial droop.  States that he has a history of Bell's palsy 2 years ago and feels that his current symptoms are similar.  Reports chronic blurry vision in the left eye which he states has not acutely worsened.  Denies any numbness, weakness.    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   Recurrent otitis media 06/09/2021   Lactose intolerance 10/20/2020   Impingement syndrome of left shoulder 11/24/2019   Healthcare maintenance 03/29/2018    Cervical disc disorder with radiculopathy of cervical region    Morbid obesity (HCC)    Anxiety disorder    History of prediabetes 02/12/2018   Thyroid nodule 02/12/2018   Sleep concern 10/28/2015   Hyperlipidemia 05/03/2010   GOUT 05/02/2010   Hypertension 05/02/2010    Past Surgical History:  Procedure Laterality Date   "groin surgery"     EYE SURGERY     Penetrating Keratoplasty       Family History  Problem Relation Age of Onset   Hypertension Mother    Hypertension Father    Diabetes Father    Heart disease Father    Hypertension Paternal Grandmother    Diabetes Paternal Grandmother     Social History   Tobacco Use   Smoking status: Every Day    Packs/day: 0.50    Years: 3.00    Pack years: 1.50    Types: Cigarettes   Smokeless tobacco: Never   Tobacco comments:    No smoking in 1.5 days   Vaping Use   Vaping Use: Never used  Substance Use Topics   Alcohol use: Yes    Alcohol/week: 6.0 standard drinks    Types: 6 Shots of liquor per week    Comment: occ   Drug use: Yes    Types: Marijuana    Home Medications Prior to Admission medications  Medication Sig Start Date End Date Taking? Authorizing Provider  ofloxacin (FLOXIN) 0.3 % OTIC solution Place 5 drops into the left ear 2 (two) times daily for 7 days. 06/29/21 07/06/21 Yes Placido Sou, PA-C  predniSONE (DELTASONE) 10 MG tablet Take 6 tablets (60 mg total) by mouth daily with breakfast for 6 days. 06/29/21 07/05/21 Yes Placido Sou, PA-C  valACYclovir (VALTREX) 1000 MG tablet Take 1 tablet (1,000 mg total) by mouth 3 (three) times daily for 7 days. 06/29/21 07/06/21 Yes Placido Sou, PA-C  amLODipine-atorvastatin (CADUET) 10-80 MG tablet Take 1 tablet by mouth daily. 06/08/21 09/06/21  Dellia Cloud, MD  aspirin 81 MG chewable tablet Chew 1 tablet (81 mg total) by mouth daily. 03/10/21   Dolan Amen, MD  benazepril (LOTENSIN) 10 MG tablet Take 1 tablet (10 mg total) by mouth daily. 06/08/21  06/08/22  Dellia Cloud, MD  fluticasone (FLONASE) 50 MCG/ACT nasal spray SPRAY 1 SPRAY INTO BOTH NOSTRILS DAILY. 06/16/21   Steffanie Rainwater, MD  ibuprofen (ADVIL) 600 MG tablet Take 1 tablet (600 mg total) by mouth every 6 (six) hours as needed. 01/23/21   Evlyn Kanner, MD  loratadine (CLARITIN) 10 MG tablet Take 1 tablet (10 mg total) by mouth daily as needed for allergies. 04/12/21 04/12/22  Steffanie Rainwater, MD  metoprolol tartrate (LOPRESSOR) 50 MG tablet Take 1 tablet (50 mg total) by mouth 2 (two) times daily. 03/10/21 03/10/22  Dolan Amen, MD  polyethylene glycol (MIRALAX) 17 g packet Take 17 g by mouth daily. 12/12/20   Dolan Amen, MD  Semaglutide,0.25 or 0.5MG /DOS, 2 MG/1.5ML SOPN Inject 0.5 mg into the skin once a week. 06/08/21 09/06/21  Dellia Cloud, MD    Allergies    Buprenorphine hcl and Morphine and related  Review of Systems   Review of Systems  All other systems reviewed and are negative. Ten systems reviewed and are negative for acute change, except as noted in the HPI.   Physical Exam Updated Vital Signs BP (!) 151/90 (BP Location: Left Arm)    Pulse (!) 58    Temp 98 F (36.7 C) (Oral)    Resp 16    Ht  (1.854 m)    Wt (!) 139.7 kg    SpO2 97%    BMI 40.64 kg/m   Physical Exam Vitals and nursing note reviewed.  Constitutional:      General: He is not in acute distress.    Appearance: Normal appearance. He is not ill-appearing, toxic-appearing or diaphoretic.  HENT:     Head: Normocephalic and atraumatic.     Right Ear: Tympanic membrane, ear canal and external ear normal. There is no impacted cerumen.     Left Ear: External ear normal.     Ears:     Comments: Left ear appears normal.  Left EAC is erythematous.  White thick discharge noted in the left EAC.  Unable to see left TM due to the discharge.  Mild tenderness overlying the left mastoid.  No overlying skin changes or soft tissue swelling.    Nose: Nose normal.     Mouth/Throat:      Mouth: Mucous membranes are moist.     Pharynx: Oropharynx is clear. No oropharyngeal exudate or posterior oropharyngeal erythema.  Eyes:     General: No scleral icterus.       Right eye: No discharge.        Left eye: No discharge.     Extraocular Movements: Extraocular movements intact.  Conjunctiva/sclera: Conjunctivae normal.  Cardiovascular:     Rate and Rhythm: Normal rate and regular rhythm.     Pulses: Normal pulses.     Heart sounds: Normal heart sounds. No murmur heard.   No friction rub. No gallop.  Pulmonary:     Effort: Pulmonary effort is normal. No respiratory distress.     Breath sounds: Normal breath sounds. No stridor. No wheezing, rhonchi or rales.  Abdominal:     General: Abdomen is flat.     Palpations: Abdomen is soft.     Tenderness: There is no abdominal tenderness.  Musculoskeletal:        General: Normal range of motion.     Cervical back: Normal range of motion and neck supple. No tenderness.  Skin:    General: Skin is warm and dry.  Neurological:     General: No focal deficit present.     Mental Status: He is alert and oriented to person, place, and time.     Comments: Speaking clearly, coherently, and in complete sentences.  Moving all 4 extremities with ease.  No gross deficits.  Strength is 5/5 in all 4 extremities.  Distal sensation intact.  Facial droop noted on the left side of the face.  Patient unable to raise left eyebrow, fully close left eye, and raise the left corner of the mouth when smiling.  No droop or weakness noted on the right side of the face.  Psychiatric:        Mood and Affect: Mood normal.        Behavior: Behavior normal.   ED Results / Procedures / Treatments   Labs (all labs ordered are listed, but only abnormal results are displayed) Labs Reviewed  COMPREHENSIVE METABOLIC PANEL - Abnormal; Notable for the following components:      Result Value   Sodium 134 (*)    Creatinine, Ser 1.25 (*)    ALT 48 (*)    All other  components within normal limits  CBC WITH DIFFERENTIAL/PLATELET   EKG None  Radiology CT Temporal Bones W Contrast  Result Date: 06/29/2021 CLINICAL DATA:  Mastoiditis, left mastoid tenderness, left-sided facial droop EXAM: CT TEMPORAL BONES WITH CONTRAST TECHNIQUE: Axial and coronal plane CT imaging of the petrous temporal bones was performed with thin-collimation image reconstruction following intravenous contrast administration. Multiplanar CT image reconstructions were also generated. CONTRAST:  75mL OMNIPAQUE IOHEXOL 300 MG/ML  SOLN COMPARISON:  01/05/2021 CT head FINDINGS: RIGHT TEMPORAL BONE External auditory canal: Cerumen in the right external auditory canal. Otherwise normal. Middle ear cavity: Normally aerated. The scutum and ossicles are normal. The tegmen tympani is intact. Inner ear structures: The cochlea, vestibule and semicircular canals are normal. The vestibular aqueduct is not enlarged. Internal auditory and facial nerve canals:  Normal Mastoid air cells: Normally aerated. No osseous erosion. LEFT TEMPORAL BONE External auditory canal: Thickening and hyperenhancement of the soft tissues of the external auditory canal, which narrows and occludes it (series 3, image 19). Unchanged osseous contours of the external auditory canal, when compared to 01/05/2021. Middle ear cavity: Normally aerated. The scutum and ossicles are normal. The tegmen tympani is intact. Inner ear structures: The cochlea, vestibule and semicircular canals are normal. The vestibular aqueduct is not enlarged. Internal auditory and facial nerve canals:  Normal. Mastoid air cells: Normally aerated. No osseous erosion. Vascular: Normal appearance of the carotid canals, jugular bulbs and sigmoid plates. Limited intracranial:  No acute or significant finding. Visible orbits/paranasal sinuses: Orbits are unremarkable. Mild  mucosal thickening in the ethmoid air cells and frontal sinuses. Soft tissues: Prominent left  preauricular lymph nodes (series 3, image 24), measuring up to 9 x 8 x 9 mm (series 3, image 24 and series 9, image 67). IMPRESSION: Left otitis externa, with thickening and hyperenhancement of the soft tissues of the external auditory canal, which appears occluded due to soft tissue edema, with enlarged pre-auricular lymph nodes, likely reactive. No evidence of osseous erosion or involvement of the mastoid air cells. Electronically Signed   By: Wiliam Ke M.D.   On: 06/29/2021 17:30    Procedures Procedures   Medications Ordered in ED Medications  predniSONE (DELTASONE) tablet 60 mg (has no administration in time range)  ofloxacin (OCUFLOX) 0.3 % ophthalmic solution 5 drop (has no administration in time range)  iohexol (OMNIPAQUE) 300 MG/ML solution 75 mL (75 mLs Intravenous Contrast Given 06/29/21 1641)    ED Course  I have reviewed the triage vital signs and the nursing notes.  Pertinent labs & imaging results that were available during my care of the patient were reviewed by me and considered in my medical decision making (see chart for details).    MDM Rules/Calculators/A&P                          Pt is a 40 y.o. male who presents to the emergency department with left-sided facial droop as well as left ear pain.  Labs: CBC without abnormalities. CMP with a sodium of 134, creatinine 1.25, ALT of 48.  Imaging: CT scan of the temporal bones show a left otitis externa, with thickening and hyperenhancement of the soft tissues of the external auditory canal, which appears occluded due to soft tissue edema, with enlargement of the preauricular lymph nodes, likely reactive.  No evidence of osseous erosion or involvement of the mastoid air cells.  I, Placido Sou, PA-C, personally reviewed and evaluated these images and lab results as part of my medical decision-making.  CT scan concerning for left-sided otitis externa.  Patient has been treated with 2 rounds of oral antibiotics  without improvement.  Will discharge on a course of ofloxacin drops.  First dose given in the emergency department.  Patient also presents today with left-sided facial droop.  Symptoms started yesterday.  Patient with a history of Bell's palsy in the left side of his face previously.  Feels that his symptoms are consistent.  He notes chronic blurry vision in the left eye which he feels is not acutely worsened.  He has a history of keratoconus.  Patient has obvious droop on the left side of his face with involvement of the left side of the mouth, left eyebrow, and left forehead.  No other neurological deficits noted.  Appears consistent with Bell's palsy.  Will treat with 1 week of prednisone as well as 1 week of Valtrex.  First dose of prednisone given in the emergency department.  Discussed continuing to use artificial tears hourly during the day while awake.  Taping the eye shut at night.  Patient given a referral to ENT and recommended that he schedule an appointment as soon as possible for reevaluation.  Feel the patient is stable for discharge at this time and he is agreeable.  We discussed return precautions.  His questions were answered and he was amicable at the time of discharge.  Note: Portions of this report may have been transcribed using voice recognition software. Every effort was made to ensure accuracy;  however, inadvertent computerized transcription errors may be present.   Final Clinical Impression(s) / ED Diagnoses Final diagnoses:  Acute otitis externa of left ear, unspecified type  Bell's palsy   Rx / DC Orders ED Discharge Orders          Ordered    ofloxacin (FLOXIN) 0.3 % OTIC solution  2 times daily        06/29/21 1754    predniSONE (DELTASONE) 10 MG tablet  Daily with breakfast        06/29/21 1754    valACYclovir (VALTREX) 1000 MG tablet  3 times daily        06/29/21 1754             Placido Sou, PA-C 06/29/21 1802    Terrilee Files, MD 06/29/21  [phone]

## 2021-06-29 NOTE — ED Triage Notes (Signed)
Pt c/o left side facial droop first noted ~4pm yesterday-states feels same as when he had bell's palsy ~4 yrs ago-also c/o left ear infection x 2 months with 2 rounds abx-NAD-steady gait

## 2021-06-29 NOTE — Discharge Instructions (Addendum)
I prescribed you 3 medications.  First medication is a drop I want you to place in the left ear.  This is called ofloxacin.  Please place 5 drops in the ear twice per day for 7 days.  I am also prescribing you a steroid called prednisone.  Please take 60 mg once per day for the next 6 days.  This medication can be stimulating to try to take it earlier in the day with a small amount of food.  Lastly, I am prescribing you an antiviral called valacyclovir.  Please take 1000 mg 3 times per day for the next 7 days.  Below is the contact information for local ear, nose, and throat physician.  Please give him a call and schedule an appointment for reevaluation.    Please apply artificial tears to the eye every hour while awake.  At night please make sure the eyes taped shut.  If you develop any new or worsening symptoms please come back to the emergency department immediately.

## 2021-07-10 ENCOUNTER — Encounter: Payer: Medicare HMO | Admitting: Internal Medicine

## 2021-07-11 ENCOUNTER — Ambulatory Visit (INDEPENDENT_AMBULATORY_CARE_PROVIDER_SITE_OTHER): Payer: Medicare HMO | Admitting: Internal Medicine

## 2021-07-11 ENCOUNTER — Encounter: Payer: Self-pay | Admitting: Internal Medicine

## 2021-07-11 VITALS — BP 146/89 | HR 79 | Temp 98.2°F | Ht 73.0 in | Wt 304.9 lb

## 2021-07-11 DIAGNOSIS — H60502 Unspecified acute noninfective otitis externa, left ear: Secondary | ICD-10-CM | POA: Diagnosis not present

## 2021-07-11 DIAGNOSIS — H538 Other visual disturbances: Secondary | ICD-10-CM

## 2021-07-11 DIAGNOSIS — G510 Bell's palsy: Secondary | ICD-10-CM | POA: Diagnosis not present

## 2021-07-11 DIAGNOSIS — Z947 Corneal transplant status: Secondary | ICD-10-CM | POA: Diagnosis not present

## 2021-07-11 DIAGNOSIS — H18613 Keratoconus, stable, bilateral: Secondary | ICD-10-CM | POA: Diagnosis not present

## 2021-07-11 DIAGNOSIS — H6092 Unspecified otitis externa, left ear: Secondary | ICD-10-CM

## 2021-07-11 MED ORDER — OFLOXACIN 0.3 % OT SOLN: 5.0000 [drp] | Freq: Two times a day (BID) | OTIC | 0 refills | Status: AC

## 2021-07-11 NOTE — Progress Notes (Signed)
CC: f/u ear infection, bell's palsy  HPI:  Samuel Decker is a 41 y.o. male with a past medical history stated below and presents today for f/u ear infection, bell's palsy. Please see problem based assessment and plan for additional details.  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     Current Outpatient Medications on File Prior to Visit  Medication Sig Dispense Refill   amLODipine-atorvastatin (CADUET) 10-80 MG tablet Take 1 tablet by mouth daily. 30 tablet 2   aspirin 81 MG chewable tablet Chew 1 tablet (81 mg total) by mouth daily. 90 tablet 3   benazepril (LOTENSIN) 10 MG tablet Take 1 tablet (10 mg total) by mouth daily. 30 tablet 11   fluticasone (FLONASE) 50 MCG/ACT nasal spray SPRAY 1 SPRAY INTO BOTH NOSTRILS DAILY. 16 mL 2   ibuprofen (ADVIL) 600 MG tablet Take 1 tablet (600 mg total) by mouth every 6 (six) hours as needed. 30 tablet 0   loratadine (CLARITIN) 10 MG tablet Take 1 tablet (10 mg total) by mouth daily as needed for allergies. 30 tablet 2   metoprolol tartrate (LOPRESSOR) 50 MG tablet Take 1 tablet (50 mg total) by mouth 2 (two) times daily. 180 tablet 3   polyethylene glycol (MIRALAX) 17 g packet Take 17 g by mouth daily. 14 each 0   Semaglutide,0.25 or 0.5MG /DOS, 2 MG/1.5ML SOPN Inject 0.5 mg into the skin once a week. 1.5 mL 2   No current facility-administered medications on file prior to visit.    Family History  Problem Relation Age of Onset   Hypertension Mother    Hypertension Father    Diabetes Father     Heart disease Father    Hypertension Paternal Grandmother    Diabetes Paternal Grandmother     Social History: currently on disability  Review of Systems: ROS negative except for what is noted on the assessment and plan.  Vitals:   07/11/21 0847  Weight: (!) 304 lb 14.4 oz (138.3 kg)     Physical Exam: General: Well appearing african [place] male, NAD HENT: normocephalic, atraumatic, MMM, left ear with crusted white purulence in your canal, erythema and edema of ear canal, unable to fully visualize tympanic membrane, translucent membrane, left mastoid tenderness, left preauricular tenderness EYES: Left eye injected with slight clouding of cornea.  No scleral icterus. CV: regular rate, normal rhythm, no murmurs, rubs, gallops. Pulmonary: normal work of breathing on RA, lungs clear to auscultation, no rales, wheezes, rhonchi Abdominal: non-distended, soft, non-tender to palpation, normal BS Skin: Warm and dry, no rashes or lesions Neurological: MS: awake, alert and oriented x3, normal speech and fund of knowledge Cns: Left facial droop, perceived numbness left face with light touch Motor: moves all extremities antigravity Psych: normal affect    Assessment & Plan:   See Encounters Tab for problem based charting.  Patient seen with Dr.  Trinda Pascal, M.D. [facility] Internal Medicine, PGY-1  Pager: [phone] Date 07/11/2021 Time 8:53 AM

## 2021-07-11 NOTE — Assessment & Plan Note (Addendum)
Patient presents for persistent symptoms of otitis externa.  Has previously been treated with a course of p.o. antibiotics.  Reported to emergency department 12/29 with purulent drainage of ear and pain.  CT temporal bones performed and negative for osseous erosion or mastoid involvement.  Was prescribed ofloxacin eardrops at discharge.  Today patient reports improvement in symptoms since ED visit though with persistence of pain in jaw and ear.  He has completed initial course of eardrops.  Continues to have clogged/closed feeling of ear especially when he sleeps at night.  He denies fevers chills malaise.  On exam, left ear with crusted white purulence in ear canal, erythema and edema of your canal, unable to fully visualize tympanic membrane, translucent membrane, left mastoid tenderness, left preauricular tenderness.   On assessment, patient has persistence of otitis externa signs and symptoms of the left ear.  He has improved on ofloxacin eardrops.  We will prescribe prolonged course of antibiotic eardrops for total 12-day course.  Given persistence of symptoms, concurrent cranial nerve involvement (Bell's palsy), mastoid tenderness, will refer patient to ENT.  Plan: -Ofloxacin eardrops, 5 drops BID for total course 12 days, refill ordered -Ambulatory referral to ENT, messaged Ms. Lissa Hoard to assist with urgent appt

## 2021-07-11 NOTE — Patient Instructions (Addendum)
Thank you, Samuel Decker for allowing [place] to provide your care today. Today we discussed:  Eye dryness and pain: We have set up an appointment for you with Earley Brooke here in [place].   Appointment at 2 PM [address] [place], [place] [address] [phone]  Ear infection: I have placed a referral for you to be seen by ENT.  I will let our scheduler know to get this set up within the next 1 to 2 weeks.  Please continue to use your eardrops for total of 12 days.   My Chart Access: [email or web address]  Please follow-up in 1 month or call to set up an appt sooner if needed.  Please make sure to arrive 15 minutes prior to your next appointment. If you arrive late, you may be asked to reschedule.    We look forward to seeing you next time. Please call our clinic at [phone] if you have any questions or concerns. The best time to call is Monday-Friday from 9am-4pm, but there is someone available 24/7. If after hours or the weekend, call the main hospital number and ask for the Internal Medicine Resident On-Call. If you need medication refills, please notify your pharmacy one week in advance and they will send [place] a request.   Thank you for letting us take part in your care. Wishing you the best!  Ellison Carwin, MD 07/11/2021, 9:33 AM IM Resident, PGY-1

## 2021-07-12 NOTE — Progress Notes (Signed)
Internal Medicine Clinic Attending  I saw and evaluated the patient.  I personally confirmed the key portions of the history and exam documented by Dr. Sharene Butters and I reviewed pertinent patient test results.  The assessment, diagnosis, and plan were formulated together and I agree with the documentation in the residents note. Same day eye appointment made for eye symptoms and complex ophthalmologic history. No eye pain, but blurry vision and dry eye symptoms. Patient is able to close his eyelid spontaneously. Although patient does have persistent ear and surrounding pain, patient reports it has been improving and we do not feel repeat imaging is needed at this time.

## 2021-07-24 ENCOUNTER — Encounter: Payer: Self-pay | Admitting: *Deleted

## 2021-07-26 ENCOUNTER — Ambulatory Visit: Payer: Medicare HMO | Admitting: Behavioral Health

## 2021-07-26 DIAGNOSIS — F419 Anxiety disorder, unspecified: Secondary | ICD-10-CM

## 2021-07-26 DIAGNOSIS — F331 Major depressive disorder, recurrent, moderate: Secondary | ICD-10-CM

## 2021-07-26 DIAGNOSIS — H6092 Unspecified otitis externa, left ear: Secondary | ICD-10-CM

## 2021-07-26 DIAGNOSIS — G510 Bell's palsy: Secondary | ICD-10-CM

## 2021-07-26 NOTE — BH Specialist Note (Signed)
Integrated Behavioral Health via Telemedicine Visit  07/26/2021 Samuel Decker [id number]  Number of [place] visits: 9 Session Start time: 2:30pm  Session End time: 3:00pm Total time: 30  Referring Provider: Dr. 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 Discussed confidentiality:  9th visit  I discussed the limitations of telemedicine and the availability of in person appointments.  Discussed there is a possibility of technology failure and discussed alternative modes of communication if that failure occurs.  I discussed that engaging in this telemedicine visit, they consent to the provision of behavioral healthcare and the services will be billed under their insurance.  Patient and/or legal guardian expressed understanding and consented to Telemedicine visit:  9th visit  Presenting Concerns: Patient and/or family reports the following symptoms/concerns: reduction in anx/dep due to his decision to move to [place] one year from now. Pt has also been dealing w/health status issues concerning his Bells Palsy, L cornea transplant, & recent bilateral OM Duration of problem: a few months ; Severity of problem: mild  Patient and/or Family's Strengths/Protective Factors: Social and Emotional competence, Concrete supports in place (healthy food, safe environments, etc.), and Sense of purpose  Goals Addressed: Patient will:  Reduce symptoms of: anxiety, depression, and mood  instability   Increase knowledge and/or ability of: coping skills and stress reduction   Demonstrate ability to: Increase healthy adjustment to current life circumstances  Progress towards Goals: Ongoing  Interventions: Interventions utilized:  Solution-Focused Strategies and Behavioral Activation: Ask for Referrals to Physicians & Providers for your move to [facility] to smoothly manage your healthcare transition. Standardized Assessments completed:  Screeners prn  Patient and/or Family Response: Pt is receptive to call today & requests future appt ck-ins  Assessment: Patient currently experiencing health status changes he is trying to manage. 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 He has not used meth, crack, cocaine, & ectasy.  Patient may benefit from cont'd ck-ins through his move in 2024.  Plan: Follow up with behavioral health clinician on : 2-3 wks on telehealth for 30 min @ 10:00am if possible Behavioral recommendations: Act on the Referral situation now moving forward. Referral(s): [facility] (In Clinic)  I discussed the assessment and treatment plan with the patient and/or parent/guardian. They were provided an opportunity to ask questions and all were answered. They agreed with the plan and demonstrated an understanding of the instructions.   They were advised to call back or seek an in-person evaluation if the symptoms worsen or if the condition fails to improve as anticipated.  Donnetta Hutching, LMFT

## 2021-07-29 ENCOUNTER — Other Ambulatory Visit: Payer: Self-pay | Admitting: Student

## 2021-08-10 DIAGNOSIS — H93292 Other abnormal auditory perceptions, left ear: Secondary | ICD-10-CM | POA: Diagnosis not present

## 2021-08-11 ENCOUNTER — Ambulatory Visit (INDEPENDENT_AMBULATORY_CARE_PROVIDER_SITE_OTHER): Payer: Medicare HMO | Admitting: Student

## 2021-08-11 ENCOUNTER — Other Ambulatory Visit: Payer: Self-pay | Admitting: Student

## 2021-08-11 ENCOUNTER — Other Ambulatory Visit: Payer: Self-pay

## 2021-08-11 ENCOUNTER — Encounter: Payer: Self-pay | Admitting: Student

## 2021-08-11 DIAGNOSIS — I10 Essential (primary) hypertension: Secondary | ICD-10-CM

## 2021-08-11 DIAGNOSIS — G510 Bell's palsy: Secondary | ICD-10-CM

## 2021-08-11 DIAGNOSIS — Z87898 Personal history of other specified conditions: Secondary | ICD-10-CM

## 2021-08-11 DIAGNOSIS — H6092 Unspecified otitis externa, left ear: Secondary | ICD-10-CM | POA: Diagnosis not present

## 2021-08-11 MED ORDER — SEMAGLUTIDE(0.25 OR 0.5MG/DOS) 2 MG/1.5ML ~~LOC~~ SOPN: 1.0000 mg | PEN_INJECTOR | SUBCUTANEOUS | 3 refills | Status: DC

## 2021-08-11 MED ORDER — BENAZEPRIL HCL 20 MG PO TABS: 20.0000 mg | ORAL_TABLET | Freq: Every day | ORAL | 1 refills | Status: DC

## 2021-08-11 NOTE — Progress Notes (Signed)
° °  CC: Follow-up/medication refill  HPI:  [name] is a 41 y.o. male with PMH as below who presents for follow-up on his ear infection, Bell's palsy and hypertension. Please see problem based charting for evaluation, assessment and plan.  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     Review of Systems:  Constitutional: Negative for fever, chills or fatigue HEENT: Negative for ear pain, ear discharge or facial droop.  Positive for blurry vision. MSK: Positive for chronic back pain Neuro: Negative for headache or weakness  Physical Exam: General: Pleasant, obese middle-age man. No acute distress. Eyes: Chronic clouding of the left cornea. No scleral icterus. Ears: L ear with mild erythema, whitish discharge in canal. Normal tympanic membrane. R ear partially filled w/ dry earwax Cardiac: RRR. No murmurs, rubs or gallops. No LE edema Respiratory: Lungs CTAB. No wheezing or crackles. Skin: Warm, dry and intact without rashes or lesions Extremities: Atraumatic. Full ROM. 2+ radial pulses. Neuro: A&O x 3. Moves all extremities. No facial droop. Normal sensation to gross touch. Psych: Appropriate mood and affect.  Vitals:   08/11/21 0919 08/11/21 0927 08/11/21 1006  BP: (!) 158/96 (!) 154/102 (!) 144/90  Pulse: 77 76 73  Temp: 98.2 F (36.8 C)    TempSrc: Oral    SpO2: 100%    Weight: (!) 307 lb 1.6 oz (139.3 kg)    Height: 6\' 1"  (1.854 m)      Assessment & Plan:   See Encounters Tab for problem based charting.  Patient  discussed with Dr. , MD, MPH

## 2021-08-11 NOTE — Assessment & Plan Note (Addendum)
Patient's BP remains uncontrolled. He reports being adherent to his medication regimen but has not eating a healthy diet for the last few months due to persistent ear infection.  He endorses some blurry vision that is improving but denies any headaches or dizziness.  BP still not at goal we will increase benazepril to 20 mg and recheck in 3 months.  Patient counseled on importance of weight loss in improving some of his chronic medical problems such as high blood pressure and diabetes. Vitals:   08/11/21 0919 08/11/21 0927 08/11/21 1006  BP: (!) 158/96 (!) 154/102 (!) 144/90   Plan: --Increase benazepril from 10 to 20 mg daily --Continue metoprolol 50 mg twice a day --Continue amlodipine-atorvastatin 10-80 mg daily --Encouraged to continue lifestyle modifications with dietary changes and exercise --Follow-up in 3 months for BP check and lab work

## 2021-08-11 NOTE — Patient Instructions (Addendum)
Thank you, Mr.Omar D Brickell for allowing [place] to provide your care today. Today we discussed your bells palsy, ear infection, blood pressure and weight loss. Continue to work on your weight loss and follow up in 3 months for lab work.     I have ordered the following medication/changed the following medications:  Increase Benzepril to 20 mg daily Increase Ozempic (semaglutide) to 1 mg, inject into skin weekly  My Chart Access: [email or web address]  Please follow-up with me in 3 months or as needed.   Please make sure to arrive 15 minutes prior to your next appointment. If you arrive late, you may be asked to reschedule.    We look forward to seeing you next time. Please call our clinic at [phone] if you have any questions or concerns. The best time to call is Monday-Friday from 9am-4pm, but there is someone available 24/7. If after hours or the weekend, call the main hospital number and ask for the Internal Medicine Resident On-Call. If you need medication refills, please notify your pharmacy one week in advance and they will send [place] a request.   Thank you for letting us take part in your care. Wishing you the best!  Lacinda Axon, MD 08/11/2021, 10:26 AM IM Resident, PGY-2 Oswaldo Milian 41:10

## 2021-08-11 NOTE — Assessment & Plan Note (Signed)
Current BMI of 40.5. Weight is slightly up 3 lbs from 1 month ago but overall down 15 pounds since starting Ozempic late last year. Patient states he has been doing well on Ozempic but admits to not eating well or working out in the past few months due to his persistent ear infection that has now resolved.  Patient plans to get back on track with exercising and eating healthier diet.  Plan: -- Increase Ozempic from 0.5 mg to 1 mg, inject weekly -- Continue exercising and decreasing caloric intake -- Follow-up in 3 months

## 2021-08-11 NOTE — Assessment & Plan Note (Signed)
Patient reports that his Bell's palsy has resolved. He was evaluated by the ophthalmologist 2 weeks ago in which 2 loose sutures were removed from left eye corneal implant. He was started on steroid eyedrops (Pred Forte) QID and a short course of erythromycin ointment. Patient states his vision has improved since then. No facial droop or injected left eye on exam.  Corneal clouding is chronic.  Plan: -- Follow-up with ophthalmologist on 2/15 as scheduled

## 2021-08-11 NOTE — Assessment & Plan Note (Signed)
BP Per patient, left ear infection has resolved. He denies any pain or further drainage from the left ear. No fevers, chills, mastoid pain or facial drooping. He was evaluated by audiologist yesterday and found to have excessive cerumen in the right ear and a vascular protrusion in the left ear. He is scheduled to see the ENT doctor on 09/14/2021. On exam patient still has some erythema and discharge in the left ear but normal tympanic membrane.  Plan: --Follow-up with ENT on 3/16 as scheduled

## 2021-08-14 ENCOUNTER — Other Ambulatory Visit: Payer: Self-pay | Admitting: Student

## 2021-08-14 DIAGNOSIS — Z87898 Personal history of other specified conditions: Secondary | ICD-10-CM

## 2021-08-14 MED ORDER — OZEMPIC (1 MG/DOSE) 4 MG/3ML ~~LOC~~ SOPN: 1.0000 mg | PEN_INJECTOR | SUBCUTANEOUS | 2 refills | Status: DC

## 2021-08-14 NOTE — Addendum Note (Signed)
Addended bySharrell Ku on: 08/14/2021 04:57 PM   Modules accepted: Orders

## 2021-08-14 NOTE — Progress Notes (Signed)
Internal Medicine Clinic Attending  Case discussed with Dr. Amponsah  At the time of the visit.  We reviewed the resident's history and exam and pertinent patient test results.  I agree with the assessment, diagnosis, and plan of care documented in the resident's note.  

## 2021-08-16 DIAGNOSIS — H18601 Keratoconus, unspecified, right eye: Secondary | ICD-10-CM | POA: Insufficient documentation

## 2021-08-16 DIAGNOSIS — Z9889 Other specified postprocedural states: Secondary | ICD-10-CM | POA: Diagnosis not present

## 2021-08-23 ENCOUNTER — Ambulatory Visit: Payer: Medicare HMO | Admitting: Behavioral Health

## 2021-08-23 DIAGNOSIS — F331 Major depressive disorder, recurrent, moderate: Secondary | ICD-10-CM

## 2021-08-23 DIAGNOSIS — F419 Anxiety disorder, unspecified: Secondary | ICD-10-CM

## 2021-08-23 NOTE — BH Specialist Note (Signed)
Integrated Behavioral Health via Telemedicine Visit  08/23/2021 BRENEN BEIGEL [id number]  Number of Integrated Behavioral Health Clinician visits: 10 Session Start time: 10:00am Session End time: 10:30am Total time in minutes: 30  Referring Provider: Dr. 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 Discussed confidentiality: Yes   I discussed the limitations of telemedicine and the availability of in person appointments.  Discussed there is a possibility of technology failure and discussed alternative modes of communication if that failure occurs.  I discussed that engaging in this telemedicine visit, they consent to the provision of behavioral healthcare and the services will be billed under their insurance.  Patient and/or legal guardian expressed understanding and consented to Telemedicine visit: Yes   Presenting Concerns: Patient and/or family reports the following symptoms/concerns: Pt exp'g a few days of dep Sx as reported today. He is always a giver towards everyone else in his life, but can never secure help for his own "down days".   Pt is focused on his current plans to move to [place], [place] next March 2024. 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 His Aunt, whom he plans to live with wants to open a Grp Home.  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  Pt is maintaining his health status issues w/regular visits to the Surgeon who performed his transplant & w/the Tx & care needed to preserve his vision  Patient and/or Family's Strengths/Protective Factors: Social and Emotional competence, Sense of purpose, and Pt resilience factors to incl: determination & specific goals to promote his future  Goals Addressed: Patient will:  Reduce symptoms of: anxiety, depression, and stress related to his tendency to care for others better than himself; explore Dr. Judithann Sauger, MD & her website on Empaths to inc your self-awareness & coping strategies for self-care  Increase knowledge and/or ability of: coping skills, self-management skills, and stress reduction   Demonstrate ability to: Increase healthy adjustment to current life circumstances and Increase adequate support systems for patient/family  Progress towards Goals: Ongoing  Interventions: Interventions utilized:  Motivational Interviewing, Mindfulness or Management consultant, Behavioral Activation, and Supportive Counseling Standardized Assessments completed:  screeners prn  Patient and/or Family Response: Pt receptive & appreciative of call today & requests future appts to maintain his enthusiasm & resolve for care of self  Assessment: Patient currently experiencing some levels of resentment towards others he is always supporting through his actions. Pt having Sx of dep that cause him to isolate from others in order to prevent him from giving all he has to others.  Patient may benefit from exploration of Empaths, their tendencies & how to protect yourself from feelings of exploitation & resentment towards others through saying No! & having healthy-boundary-setting skills.  Plan: Follow up with behavioral health clinician on : 3 wks for 30 min ck-in Behavioral recommendations: Explore Empaths & Dr. Judithann Sauger, MD for resources Referral(s):  [facility]  [employer] (In Clinic)  I discussed the assessment and treatment plan with the patient and/or parent/guardian. They were provided an opportunity to ask questions and all were answered. They agreed with the plan and demonstrated an understanding of the instructions.   They were advised to call back or seek an in-person evaluation if the symptoms worsen or if the condition fails to improve as anticipated.  Deneise Lever, LMFT

## 2021-08-25 NOTE — Telephone Encounter (Signed)
Refilled 06/08/21

## 2021-09-06 ENCOUNTER — Other Ambulatory Visit: Payer: Self-pay | Admitting: Internal Medicine

## 2021-09-06 DIAGNOSIS — I10 Essential (primary) hypertension: Secondary | ICD-10-CM

## 2021-09-06 DIAGNOSIS — E782 Mixed hyperlipidemia: Secondary | ICD-10-CM

## 2021-09-13 DIAGNOSIS — Z9889 Other specified postprocedural states: Secondary | ICD-10-CM | POA: Diagnosis not present

## 2021-09-13 DIAGNOSIS — H18601 Keratoconus, unspecified, right eye: Secondary | ICD-10-CM | POA: Diagnosis not present

## 2021-09-14 DIAGNOSIS — H6121 Impacted cerumen, right ear: Secondary | ICD-10-CM | POA: Diagnosis not present

## 2021-09-14 DIAGNOSIS — H6692 Otitis media, unspecified, left ear: Secondary | ICD-10-CM | POA: Diagnosis not present

## 2021-09-14 DIAGNOSIS — G510 Bell's palsy: Secondary | ICD-10-CM | POA: Diagnosis not present

## 2021-09-18 ENCOUNTER — Ambulatory Visit: Payer: Medicare HMO | Admitting: Behavioral Health

## 2021-09-18 DIAGNOSIS — F331 Major depressive disorder, recurrent, moderate: Secondary | ICD-10-CM

## 2021-09-18 DIAGNOSIS — F419 Anxiety disorder, unspecified: Secondary | ICD-10-CM

## 2021-09-18 NOTE — BH Specialist Note (Signed)
Integrated Behavioral Health via Telemedicine Visit ? ?09/18/2021 ?Samuel Decker ?[id number] ? ?Number of Integrated Behavioral Health Clinician visits: 11 ?Session Start time: 1000 am ?Session End time: 1045 am ?Total time in minutes: 45 min ? ?Referring Provider: Dr. Corinna Gab, MD ?Patient/Family location: Pt is home in private ?[facility] Provider location: [facility] ?All persons participating in visit: Pt & Clinician ?Types of Service: Individual psychotherapy ? ?I connected with Samuel Decker and/or [name] Harland's  self  via  Telephone or Video Enabled Telemedicine Application  (Video is Caregility application) and verified that I am speaking with the correct person using two identifiers. Discussed confidentiality: Yes  ? ?I discussed the limitations of telemedicine and the availability of in person appointments.  Discussed there is a possibility of technology failure and discussed alternative modes of communication if that failure occurs. ? ?I discussed that engaging in this telemedicine visit, they consent to the provision of behavioral healthcare and the services will be billed under their insurance. ? ?Patient and/or legal guardian expressed understanding and consented to Telemedicine visit: Yes  ? ?Presenting Concerns: ?Patient and/or family reports the following symptoms/concerns: elevated anx/dep due to his examination of his sexual preferences & sharing this w/friends who judge his own choices in significant others ?Duration of problem: yrs; Severity of problem: moderate ? ?Patient and/or Family's Strengths/Protective Factors: ?Social connections, Social and Emotional competence, Concrete supports in place (healthy food, safe environments, etc.), and Sense of purpose ? ?Goals Addressed: ?Patient will: ? Reduce symptoms of: anxiety, depression, stress, and relational concerns & his upcoming planned move to [place]   ? Increase knowledge and/or ability of: coping skills, healthy habits, self-management  skills, and stress reduction  ? Demonstrate ability to: Increase healthy adjustment to current life circumstances ? ?Progress towards Goals: ?Ongoing ? ?Interventions: ?Interventions utilized:  Solution-Focused Strategies and Supportive Counseling ?Standardized Assessments completed:  screeners prn ? ?Patient and/or Family Response: Pt receptive to changing his Wed appt this week until today stating he needs it. Pt requests future appt. ? ?Assessment: ?Patient currently experiencing elevated anx/dep with recent interactions over the wknd w/his friends. Pt is aware his friends do not recognize he is dep'd. He has always been strong & supportive of his friend grp. Today, Pt is wondering if his friendships are as deep as he has always thought.  ? ?Patient may benefit from cont'd sessions of psychotherapy to address his issues prior to his move to [place]. ? ?Plan: ?Follow up with behavioral health clinician on : 3 wks for 30 min on telehealth ?Behavioral recommendations: Inc self-care practices ?Referral(s): [facility] [employer] (In Clinic) ? ?I discussed the assessment and treatment plan with the patient and/or parent/guardian. They were provided an opportunity to ask questions and all were answered. They agreed with the plan and demonstrated an understanding of the instructions. ?  ?They were advised to call back or seek an in-person evaluation if the symptoms worsen or if the condition fails to improve as anticipated. ? ?Deneise Lever, LMFT ?

## 2021-09-20 ENCOUNTER — Institutional Professional Consult (permissible substitution): Payer: Medicare HMO | Admitting: Behavioral Health

## 2021-10-09 ENCOUNTER — Other Ambulatory Visit: Payer: Self-pay | Admitting: Student

## 2021-10-09 DIAGNOSIS — E782 Mixed hyperlipidemia: Secondary | ICD-10-CM

## 2021-10-09 DIAGNOSIS — I10 Essential (primary) hypertension: Secondary | ICD-10-CM

## 2021-10-12 MED ORDER — AMLODIPINE-ATORVASTATIN 10-80 MG PO TABS: 1.0000 | ORAL_TABLET | Freq: Every day | ORAL | 1 refills | Status: DC

## 2021-10-17 ENCOUNTER — Ambulatory Visit: Payer: Medicare HMO | Admitting: Behavioral Health

## 2021-10-17 DIAGNOSIS — F331 Major depressive disorder, recurrent, moderate: Secondary | ICD-10-CM

## 2021-10-17 DIAGNOSIS — F419 Anxiety disorder, unspecified: Secondary | ICD-10-CM

## 2021-10-17 NOTE — BH Specialist Note (Signed)
Integrated Behavioral Health via Telemedicine Visit ? ?10/17/2021 ?Samuel Decker ?[id number] ? ?Number of Integrated Behavioral Health Clinician visits: 12 ?Session Start time: 1400 ?Session End time: 1430 ?Total time in minutes: 30 min ? ?Referring Provider: Dr. Corinna Gab, MD ?Patient/Family location: Pt is in his car, alone & pulled over ?[facility] Provider location: [facility] Office ?All persons participating in visit: Pt & Clinician ?Types of Service: Individual psychotherapy ? ?I connected with Samuel Decker and/or Samuel Decker's  self  via  Telephone or Video Enabled Telemedicine Application  (Video is Caregility application) and verified that I am speaking with the correct person using two identifiers. Discussed confidentiality: Yes  ? ?I discussed the limitations of telemedicine and the availability of in person appointments.  Discussed there is a possibility of technology failure and discussed alternative modes of communication if that failure occurs. ? ?I discussed that engaging in this telemedicine visit, they consent to the provision of behavioral healthcare and the services will be billed under their insurance. ? ?Patient and/or legal guardian expressed understanding and consented to Telemedicine visit: Yes  ? ?Presenting Concerns: ?Patient and/or family reports the following symptoms/concerns: reduction in anx/dep due to accomplishing securing his eye glasses ? ?Pt had intense dep episode on Easter after a dream invlg 3 ppl who are dead ?Duration of problem: months; Severity of problem: moderate ? ?Patient and/or Family's Strengths/Protective Factors: ?Social connections, Social and Emotional competence, Concrete supports in place (healthy food, safe environments, etc.), and Sense of purpose ? ?Goals Addressed: ?Patient will: ? Reduce symptoms of: anxiety and depression  ? Increase knowledge and/or ability of: coping skills and healthy habits  ? Demonstrate ability to: Increase healthy adjustment to  current life circumstances ? ?Progress towards Goals: ?Ongoing ? ?Interventions: ?Interventions utilized:  Solution-Focused Strategies and Supportive Counseling ?Standardized Assessments completed: Not Needed ? ?Patient and/or Family Response: Pt is receptive to call today & requests future visits for mental health wellness ? ?Assessment: ?Patient currently experiencing reduction in anx/dep due to upcoming Case Petition he filed w/the Sport and exercise psychologist.  ? ?Patient may benefit from cont'd Cslg. ? ?Plan: ?Follow up with behavioral health clinician on : 3-4 wks on telehealth for 30 min ?Behavioral recommendations: Focus on your presentation ?Referral(s): [facility] [employer] (In Clinic) ? ?I discussed the assessment and treatment plan with the patient and/or parent/guardian. They were provided an opportunity to ask questions and all were answered. They agreed with the plan and demonstrated an understanding of the instructions. ?  ?They were advised to call back or seek an in-person evaluation if the symptoms worsen or if the condition fails to improve as anticipated. ? ?Deneise Lever, LMFT ?

## 2021-11-01 DIAGNOSIS — Z9889 Other specified postprocedural states: Secondary | ICD-10-CM | POA: Diagnosis not present

## 2021-11-01 DIAGNOSIS — H18601 Keratoconus, unspecified, right eye: Secondary | ICD-10-CM | POA: Diagnosis not present

## 2021-11-09 ENCOUNTER — Ambulatory Visit: Payer: Medicare HMO | Admitting: Behavioral Health

## 2021-11-29 ENCOUNTER — Ambulatory Visit: Payer: Medicare HMO

## 2021-11-30 ENCOUNTER — Other Ambulatory Visit: Payer: Self-pay

## 2021-11-30 ENCOUNTER — Encounter: Payer: Self-pay | Admitting: Student

## 2021-11-30 ENCOUNTER — Ambulatory Visit (INDEPENDENT_AMBULATORY_CARE_PROVIDER_SITE_OTHER): Payer: Medicare HMO | Admitting: Student

## 2021-11-30 VITALS — BP 139/80 | HR 68 | Temp 97.7°F | Ht 73.0 in | Wt 286.7 lb

## 2021-11-30 VITALS — Ht 73.0 in | Wt 286.7 lb

## 2021-11-30 DIAGNOSIS — F419 Anxiety disorder, unspecified: Secondary | ICD-10-CM

## 2021-11-30 DIAGNOSIS — I10 Essential (primary) hypertension: Secondary | ICD-10-CM | POA: Diagnosis not present

## 2021-11-30 DIAGNOSIS — R29818 Other symptoms and signs involving the nervous system: Secondary | ICD-10-CM | POA: Diagnosis not present

## 2021-11-30 DIAGNOSIS — Z7689 Persons encountering health services in other specified circumstances: Secondary | ICD-10-CM

## 2021-11-30 DIAGNOSIS — Z0000 Encounter for general adult medical examination without abnormal findings: Secondary | ICD-10-CM

## 2021-11-30 DIAGNOSIS — F17210 Nicotine dependence, cigarettes, uncomplicated: Secondary | ICD-10-CM

## 2021-11-30 DIAGNOSIS — H6092 Unspecified otitis externa, left ear: Secondary | ICD-10-CM

## 2021-11-30 DIAGNOSIS — Z7252 High risk homosexual behavior: Secondary | ICD-10-CM

## 2021-11-30 DIAGNOSIS — Z8619 Personal history of other infectious and parasitic diseases: Secondary | ICD-10-CM

## 2021-11-30 DIAGNOSIS — R69 Illness, unspecified: Secondary | ICD-10-CM | POA: Diagnosis not present

## 2021-11-30 MED ORDER — OZEMPIC (2 MG/DOSE) 8 MG/3ML ~~LOC~~ SOPN: 2.0000 mg | PEN_INJECTOR | SUBCUTANEOUS | 3 refills | Status: DC

## 2021-11-30 MED ORDER — EMTRICITABINE-TENOFOVIR DF 200-300 MG PO TABS
1.0000 | ORAL_TABLET | Freq: Every day | ORAL | 1 refills | Status: AC
Start: 2021-11-30 — End: 2022-05-29

## 2021-11-30 NOTE — Patient Instructions (Signed)
Health Maintenance, Male Adopting a healthy lifestyle and getting preventive care are important in promoting health and wellness. Ask your health care provider about: The right schedule for you to have regular tests and exams. Things you can do on your own to prevent diseases and keep yourself healthy. What should I know about diet, weight, and exercise? Eat a healthy diet  Eat a diet that includes plenty of vegetables, fruits, low-fat dairy products, and lean protein. Do not eat a lot of foods that are high in solid fats, added sugars, or sodium. Maintain a healthy weight Body mass index (BMI) is a measurement that can be used to identify possible weight problems. It estimates body fat based on height and weight. Your health care provider can help determine your BMI and help you achieve or maintain a healthy weight. Get regular exercise Get regular exercise. This is one of the most important things you can do for your health. Most adults should: Exercise for at least 150 minutes each week. The exercise should increase your heart rate and make you sweat (moderate-intensity exercise). Do strengthening exercises at least twice a week. This is in addition to the moderate-intensity exercise. Spend less time sitting. Even light physical activity can be beneficial. Watch cholesterol and blood lipids Have your blood tested for lipids and cholesterol at 39 years of age, then have this test every 5 years. You may need to have your cholesterol levels checked more often if: Your lipid or cholesterol levels are high. You are older than 40 years of age. You are at high risk for heart disease. What should I know about cancer screening? Many types of cancers can be detected early and may often be prevented. Depending on your health history and family history, you may need to have cancer screening at various ages. This may include screening for: Colorectal cancer. Prostate cancer. Skin cancer. Lung  cancer. What should I know about heart disease, diabetes, and high blood pressure? Blood pressure and heart disease High blood pressure causes heart disease and increases the risk of stroke. This is more likely to develop in people who have high blood pressure readings or are overweight. Talk with your health care provider about your target blood pressure readings. Have your blood pressure checked: Every 3-5 years if you are 18-39 years of age. Every year if you are 40 years old or older. If you are between the ages of 65 and 75 and are a current or former smoker, ask your health care provider if you should have a one-time screening for abdominal aortic aneurysm (AAA). Diabetes Have regular diabetes screenings. This checks your fasting blood sugar level. Have the screening done: Once every three years after age 45 if you are at a normal weight and have a low risk for diabetes. More often and at a younger age if you are overweight or have a high risk for diabetes. What should I know about preventing infection? Hepatitis B If you have a higher risk for hepatitis B, you should be screened for this virus. Talk with your health care provider to find out if you are at risk for hepatitis B infection. Hepatitis C Blood testing is recommended for: Everyone born from 1945 through 1965. Anyone with known risk factors for hepatitis C. Sexually transmitted infections (STIs) You should be screened each year for STIs, including gonorrhea and chlamydia, if: You are sexually active and are younger than 39 years of age. You are older than 39 years of age and your   health care provider tells you that you are at risk for this type of infection. Your sexual activity has changed since you were last screened, and you are at increased risk for chlamydia or gonorrhea. Ask your health care provider if you are at risk. Ask your health care provider about whether you are at high risk for HIV. Your health care provider  may recommend a prescription medicine to help prevent HIV infection. If you choose to take medicine to prevent HIV, you should first get tested for HIV. You should then be tested every 3 months for as long as you are taking the medicine. Follow these instructions at home: Alcohol use Do not drink alcohol if your health care provider tells you not to drink. If you drink alcohol: Limit how much you have to 0-2 drinks a day. Know how much alcohol is in your drink. In the [place] one drink equals one 12 oz bottle of beer (355 mL), one 5 oz glass of wine (148 mL), or one 1 oz glass of hard liquor (44 mL). Lifestyle Do not use any products that contain nicotine or tobacco. These products include cigarettes, chewing tobacco, and vaping devices, such as e-cigarettes. If you need help quitting, ask your health care provider. Do not use street drugs. Do not share needles. Ask your health care provider for help if you need support or information about quitting drugs. General instructions Schedule regular health, dental, and eye exams. Stay current with your vaccines. Tell your health care provider if: You often feel depressed. You have ever been abused or do not feel safe at home. Summary Adopting a healthy lifestyle and getting preventive care are important in promoting health and wellness. Follow your health care provider's instructions about healthy diet, exercising, and getting tested or screened for diseases. Follow your health care provider's instructions on monitoring your cholesterol and blood pressure. This information is not intended to replace advice given to you by your health care provider. Make sure you discuss any questions you have with your health care provider. Document Revised: 11/07/2020 Document Reviewed: 11/07/2020 Elsevier Patient Education  2023 Elsevier Inc.  

## 2021-11-30 NOTE — Progress Notes (Signed)
   CC: Follow-up  HPI:  Samuel Decker is a 41 y.o. male with PMH as below who presents to clinic today for follow-up on his chronic medical problems. Please see problem based charting for evaluation, assessment and plan.  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     Review of Systems:  Constitutional: Negative for fever or fatigue.  Positive for weight loss. HEENT: Negative for visual changes or ear pain. Respiratory: Negative for shortness of breath Cardiac: Negative for chest pain Neuro: Negative for headache or weakness  Physical Exam: General: Pleasant, obese middle-age man.  No acute distress. HEENT: Chronic clouding of the left cornea. Cardiac: RRR. No murmurs, rubs or gallops. No LE edema Respiratory: Lungs CTAB. No wheezing or crackles. Abdominal: Soft, symmetric and non tender. Normal BS. Skin: Warm, dry and intact without rashes or lesions Neuro: A&O x 3. Moves all extremities.  Normal sensation to gross touch. Psych: Very good mood today.  Vitals:   11/30/21 0856 11/30/21 0911  BP: (!) 158/92 139/80  Pulse: 71 68  Temp: 97.7 F (36.5 C)   TempSrc: Oral   SpO2: 100%   Weight: 286 lb 11.2 oz (130 kg)   Height: 6\' 1"  (1.854 m)     Assessment & Plan:   See Encounters Tab for problem based charting.  Patient discussed with Dr. , MD, MPH

## 2021-11-30 NOTE — Patient Instructions (Signed)
Thank you, Mr.Javelle D Harjo for allowing [place] to provide your care today. Today we discussed your weight loss, blood pressure , anxiety/depression and STI screenings.  Congratulations on your weight loss.   I have ordered the following labs for you:   Lab Orders         BMP8+Anion Gap         HIV antibody (with reflex)         RPR      I will call if any are abnormal. All of your labs can be accessed through "My Chart".  I have place a referrals for sleep study  I have ordered the following tests:   I have ordered the following medication/changed the following medications:  Start Truvada for HIV prophylaxis Increase Ozempic to 2 mg weekly  My Chart Access: [email or web address]  Please follow-up in 6 months  Please make sure to arrive 15 minutes prior to your next appointment. If you arrive late, you may be asked to reschedule.    We look forward to seeing you next time. Please call our clinic at [phone] if you have any questions or concerns. The best time to call is Monday-Friday from 9am-4pm, but there is someone available 24/7. If after hours or the weekend, call the main hospital number and ask for the Internal Medicine Resident On-Call. If you need medication refills, please notify your pharmacy one week in advance and they will send [place] a request.   Thank you for letting us take part in your care. Wishing you the best!  Steffanie Rainwater, MD 11/30/2021, 9:54 AM IM Resident, PGY-2 [name] 41:10

## 2021-11-30 NOTE — Progress Notes (Signed)
Subjective:   Samuel Decker is a 41 y.o. male who presents for an Initial Medicare Annual Wellness Visit. I connected with  Samuel LocketWalter D Grider on 11/30/21 by a  in person visit   e [facility]   Provider Location: Office/Clinic  I discussed the limitations of evaluation and management by telemedicine. The patient expressed understanding and agreed to proceed.   Review of Systems     DEFERRED TO PCP        Objective:    Today's Vitals   There is no height or weight on file to calculate BMI.     11/30/2021    9:07 AM 08/11/2021    9:26 AM 07/11/2021    9:43 AM 06/29/2021    1:20 PM 06/08/2021   10:46 AM 04/27/2021   10:01 AM 03/24/2021    9:54 AM  Advanced Directives  Does Patient Have a Medical Advance Directive? No No No No No No No  Would patient like information on creating a medical advance directive? No - Patient declined No - Patient declined No - Patient declined  No - Patient declined No - Patient declined     Current Medications (verified) Outpatient Encounter Medications as of 11/30/2021  Medication Sig   amLODipine-atorvastatin (CADUET) 10-80 MG tablet Take 1 tablet by mouth daily.   aspirin 81 MG chewable tablet Chew 1 tablet (81 mg total) by mouth daily.   benazepril (LOTENSIN) 20 MG tablet Take 1 tablet (20 mg total) by mouth daily.   fluticasone (FLONASE) 50 MCG/ACT nasal spray INSTILL 1 SPRAY INTO BOTH NOSTRILS DAILY   ibuprofen (ADVIL) 600 MG tablet Take 1 tablet (600 mg total) by mouth every 6 (six) hours as needed.   loratadine (CLARITIN) 10 MG tablet Take 1 tablet (10 mg total) by mouth daily as needed for allergies.   metoprolol tartrate (LOPRESSOR) 50 MG tablet Take 1 tablet (50 mg total) by mouth 2 (two) times daily.   olopatadine (PATANOL) 0.1 % ophthalmic solution Place 1 drop into both eyes daily.   Semaglutide, 1 MG/DOSE, (OZEMPIC, 1 MG/DOSE,) 4 MG/3ML SOPN Inject 1 mg into the skin once a week.   No facility-administered  encounter medications on file as of 11/30/2021.    Allergies (verified) Buprenorphine hcl and Morphine and related   History: Past Medical History:  Diagnosis Date   Asthma    Childhood, not active   CHEST PAIN UNSPECIFIED 05/03/2010   Qualifier: Diagnosis of  By: Huntley DecWeaver PA-C, Scott     ELECTROCARDIOGRAM, ABNORMAL 05/03/2010   Qualifier: Diagnosis of  By: Huntley DecWeaver PA-C, Scott     Gout    Most recent flare 01/2018   HYPERLIPIDEMIA 05/03/2010   Qualifier: Diagnosis of  By: Huntley DecWeaver PA-C, Scott     Hypertension    Keratoconus of both eyes    Legally blind    Syphilis 10/20/2020   Diagnosed 12/2018 and treated.    Past Surgical History:  Procedure Laterality Date   "groin surgery"     EYE SURGERY     Penetrating Keratoplasty   Family History  Problem Relation Age of Onset   Hypertension Mother    Hypertension Father    Diabetes Father    Heart disease Father    Hypertension Paternal Grandmother    Diabetes Paternal Grandmother    Social History   Socioeconomic History   Marital status: Married    Spouse name: Not on file   Number of children: Not on file  Years of education: Not on file   Highest education level: Not on file  Occupational History   Occupation: diability  Tobacco Use   Smoking status: Every Day    Packs/day: 0.50    Years: 3.00    Pack years: 1.50    Types: Cigarettes   Smokeless tobacco: Never   Tobacco comments:    No smoking in 1.5 days   Vaping Use   Vaping Use: Never used  Substance and Sexual Activity   Alcohol use: Yes    Alcohol/week: 6.0 standard drinks    Types: 6 Shots of liquor per week    Comment: occ   Drug use: Yes    Types: Marijuana   Sexual activity: Not on file  Other Topics Concern   Not on file  Social History Narrative   Not on file   Social Determinants of Health   Financial Resource Strain: Not on file  Food Insecurity: Not on file  Transportation Needs: Not on file  Physical Activity: Not on file  Stress: Not on  file  Social Connections: Not on file    Tobacco Counseling Ready to quit: Not Answered Counseling given: Not Answered Tobacco comments: No smoking in 1.5 days    Clinical Intake:                 Diabetic? NO          Activities of Daily Living    11/30/2021    9:05 AM 08/11/2021    9:26 AM  In your present state of health, do you have any difficulty performing the following activities:  Hearing? 0 0  Vision? 0 1  Difficulty concentrating or making decisions? 0 0  Walking or climbing stairs? 0 0  Dressing or bathing? 0 0  Doing errands, shopping? 0 0    Patient Care Team: Steffanie Rainwater, MD as PCP - General (Internal Medicine)  Indicate any recent Medical Services you may have received from other than [facility] providers in the past year (date may be approximate).     Assessment:   This is a routine wellness examination for Samuel Decker.  Hearing/Vision screen No results found.  Dietary issues and exercise activities discussed:     Goals Addressed   None   Depression Screen    11/30/2021    9:04 AM 08/11/2021    9:26 AM 08/11/2021    9:20 AM 07/11/2021    9:47 AM 06/08/2021   10:45 AM 04/27/2021   10:00 AM 04/07/2021    9:29 AM  PHQ 2/9 Scores  PHQ - 2 Score 0 0 0 0 0 0 0  PHQ- 9 Score 0  0 0 0 0 1    Fall Risk    11/30/2021    9:02 AM 08/11/2021    9:25 AM 07/11/2021    9:44 AM 06/08/2021   10:44 AM 04/27/2021    9:59 AM  Fall Risk   Falls in the past year? 0 1 1 1 1   Number falls in past yr: 0 1 0 1 1  Injury with Fall? 0 0 1 1 1   Risk for fall due to : No Fall Risks Impaired balance/gait Impaired balance/gait Impaired balance/gait   Follow up Falls evaluation completed;Falls prevention discussed Falls evaluation completed;Falls prevention discussed Falls evaluation completed Falls evaluation completed Falls evaluation completed;Falls prevention discussed    FALL RISK PREVENTION PERTAINING TO THE HOME:  Any stairs in or around the home? No   If so, are there  any without handrails? No  Home free of loose throw rugs in walkways, pet beds, electrical cords, etc? No  Adequate lighting in your home to reduce risk of falls? No   ASSISTIVE DEVICES UTILIZED TO PREVENT FALLS:  Life alert? No  Use of a cane, walker or w/c? No  Grab bars in the bathroom? No  Shower chair or bench in shower? No  Elevated toilet seat or a handicapped toilet? No   TIMED UP AND GO:  Was the test performed? No .  Length of time to ambulate 10 feet: N/A sec.     Cognitive Function:        Immunizations Immunization History  Administered Date(s) Administered   Influenza,inj,Quad PF,6+ Mos 03/28/2018, 03/17/2019, 03/24/2021   PFIZER(Purple Top)SARS-COV-2 Vaccination 10/14/2019   Pneumococcal Polysaccharide-23 04/21/2019   Tdap 03/28/2018    TDAP status: Up to date  Flu Vaccine status: Up to date  Pneumococcal vaccine status: Up to date  Covid-19 vaccine status: Completed vaccines  Qualifies for Shingles Vaccine? No   Zostavax completed No   Shingrix Completed?: No.    Education has been provided regarding the importance of this vaccine. Patient has been advised to call insurance company to determine out of pocket expense if they have not yet received this vaccine. Advised may also receive vaccine at local pharmacy or Health Dept. Verbalized acceptance and understanding.  Screening Tests Health Maintenance  Topic Date Due   COVID-19 Vaccine (2 - Pfizer risk series) 11/04/2019   INFLUENZA VACCINE  01/30/2022   TETANUS/TDAP  03/28/2028   Hepatitis C Screening  Completed   HIV Screening  Completed   HPV VACCINES  Aged Out    Health Maintenance  Health Maintenance Due  Topic Date Due   COVID-19 Vaccine (2 - Pfizer risk series) 11/04/2019      Lung Cancer Screening: (Low Dose CT Chest recommended if Age 52-80 years, 30 pack-year currently smoking OR have quit w/in 15years.) does not qualify.   Lung Cancer Screening Referral:   DEFERRED  TO PCP   Additional Screening:  Hepatitis C Screening: does qualify; Completed 08/24/2019  Vision Screening: Recommended annual ophthalmology exams for early detection of glaucoma and other disorders of the eye. Is the patient up to date with their annual eye exam?  No  Who is the provider or what is the name of the office in which the patient attends annual eye exams? [facility]  If pt is not established with a provider, would they like to be referred to a provider to establish care? No .   Dental Screening: Recommended annual dental exams for proper oral hygiene  Community Resource Referral / Chronic Care Management: CRR required this visit?  No   CCM required this visit?  No      Plan:     I have personally reviewed and noted the following in the patient's chart:   Medical and social history Use of alcohol, tobacco or illicit drugs  Current medications and supplements including opioid prescriptions. Patient is not currently taking opioid prescriptions. Functional ability and status Nutritional status Physical activity Advanced directives List of other physicians Hospitalizations, surgeries, and ER visits in previous 12 months Vitals Screenings to include cognitive, depression, and falls Referrals and appointments  In addition, I have reviewed and discussed with patient certain preventive protocols, quality metrics, and best practice recommendations. A written personalized care plan for preventive services as well as general preventive health recommendations were provided to patient.     Durwin Glaze  Katrinka Blazing, CMA   11/30/2021   Nurse Notes: in person visit   20 mins    Mr. Liller , Thank you for taking time to come for your Medicare Wellness Visit. I appreciate your ongoing commitment to your health goals. Please review the following plan we discussed and let me know if I can assist you in the future.   These are the goals we discussed:  Goals   None      This is a list of the screening recommended for you and due dates:  Health Maintenance  Topic Date Due   COVID-19 Vaccine (2 - Pfizer risk series) 11/04/2019   Flu Shot  01/30/2022   Tetanus Vaccine  03/28/2028   Hepatitis C Screening: USPSTF Recommendation to screen - Ages 18-79 yo.  Completed   HIV Screening  Completed   HPV Vaccine  Aged Out

## 2021-12-01 ENCOUNTER — Encounter: Payer: Self-pay | Admitting: Student

## 2021-12-01 DIAGNOSIS — G4733 Obstructive sleep apnea (adult) (pediatric): Secondary | ICD-10-CM | POA: Insufficient documentation

## 2021-12-01 DIAGNOSIS — Z8619 Personal history of other infectious and parasitic diseases: Secondary | ICD-10-CM | POA: Insufficient documentation

## 2021-12-01 DIAGNOSIS — R29818 Other symptoms and signs involving the nervous system: Secondary | ICD-10-CM | POA: Insufficient documentation

## 2021-12-01 NOTE — Assessment & Plan Note (Signed)
Patient patient was evaluated by ENT in March 2023.  He was found to have cerumen impaction which was removed.  Patient states since then, he has not had any ear issues. -Continue to monitor

## 2021-12-01 NOTE — Assessment & Plan Note (Signed)
Patient's current BMI is 37.8 from 40.5 during last office visit.  Patient has lost 21 pounds since last visit in February.  States he has been working on his diet and has been adherent to [employer].  Patient's mood is much better and blood pressure is also improving.  Patient was congratulated on the work he is putting in to lose weight and encouraged to continue.  Patient's weight goal is to get to the 240s.  Plan: -Increase Ozempic from 1 mg to 2 mg weekly injections -Encouraged to continue lifestyle modifications with dietary changes and exercise -6 months follow-up

## 2021-12-01 NOTE — Assessment & Plan Note (Signed)
Anxiety is not well controlled. Patient has been working with Dr. Monna Fam and has had significant improvement in his anxiety/stress. States his anxiety was due to certain stressors in his life which have now resolved. Patient appreciative of Dr. Gean Quint advice and empathic listening. -Continue CBT with Dr. Monna Fam

## 2021-12-01 NOTE — Assessment & Plan Note (Addendum)
Patient here for blood pressure follow-up. States he has been adherent to his current regimen. Blood pressure much improved today in clinic. Patient reports he has been working with Dr. Monna Fam, and has been less stressed lately. He denies any headaches or dizziness. Blurry vision has not improved now that he has his new glasses.  Weight is down 21 pounds. Vitals:   11/30/21 0856 11/30/21 0911  BP: (!) 158/92 139/80   Plan: -Continue benazepril 20 mg daily -Continue metoprolol 50 mg twice daily -Continue amlodipine-atorvastatin 10-80 mg daily -Congratulated on weight loss and improving BP, encouraged to continue lifestyle modification with dietary changes and exercise.  -BMP pending -Follow-up in 6 months

## 2021-12-01 NOTE — Progress Notes (Signed)
Internal Medicine Clinic Attending  Case discussed with Dr. Amponsah  At the time of the visit.  We reviewed the resident's history and exam and pertinent patient test results.  I agree with the assessment, diagnosis, and plan of care documented in the resident's note.  

## 2021-12-01 NOTE — Assessment & Plan Note (Signed)
Patient identifies as a MSM and would like to be screened for STDs yearly. Patient denies any new sexual partners or symptoms concerning for STD exposure. HIV negative today. Patient counseled on preexposure prophylaxis for HIV. Patient agreeable to starting Truvada today.  -Start Truvada, 1 tablet daily -Repeat testing in 6 to 12 months

## 2021-12-01 NOTE — Assessment & Plan Note (Signed)
Patient with a stop bang score of 5, moderate to severe risk of OSA.  Patient reports snoring at night.  He remains obese with elevated BP.  Patient recently lost 21 pounds in 3 months. Patient now willing to do a sleep study. -Sleep study referral placed -Encouraged to continue weight loss

## 2021-12-01 NOTE — Assessment & Plan Note (Addendum)
Patient with a history of syphilis that was treated few years ago. RPR titers have been decreasing over the last 2 years. RPR titers has improved to 1:4 from 1:8 nine months ago. -Continue to monitor

## 2021-12-02 LAB — RPR: RPR Ser Ql: REACTIVE — AB

## 2021-12-02 LAB — RPR, QUANT+TP ABS (REFLEX)
Rapid Plasma Reagin, Quant: 1:4 {titer} — ABNORMAL HIGH
T Pallidum Abs: REACTIVE — AB

## 2021-12-02 LAB — BMP8+ANION GAP
Anion Gap: 16 mmol/L (ref 10.0–18.0)
BUN/Creatinine Ratio: 9 (ref 9–20)
BUN: 10 mg/dL (ref 6–20)
CO2: 19 mmol/L — ABNORMAL LOW (ref 20–29)
Calcium: 9.3 mg/dL (ref 8.7–10.2)
Chloride: 104 mmol/L (ref 96–106)
Creatinine, Ser: 1.09 mg/dL (ref 0.76–1.27)
Glucose: 86 mg/dL (ref 70–99)
Potassium: 4.7 mmol/L (ref 3.5–5.2)
Sodium: 139 mmol/L (ref 134–144)
eGFR: 89 mL/min/{1.73_m2} (ref >59)

## 2021-12-02 LAB — HIV ANTIBODY (ROUTINE TESTING W REFLEX): HIV Screen 4th Generation wRfx: NONREACTIVE

## 2021-12-06 ENCOUNTER — Ambulatory Visit: Payer: Medicare HMO | Admitting: Behavioral Health

## 2021-12-06 DIAGNOSIS — F419 Anxiety disorder, unspecified: Secondary | ICD-10-CM

## 2021-12-06 DIAGNOSIS — Z630 Problems in relationship with spouse or partner: Secondary | ICD-10-CM

## 2021-12-06 DIAGNOSIS — F331 Major depressive disorder, recurrent, moderate: Secondary | ICD-10-CM

## 2021-12-06 NOTE — BH Specialist Note (Signed)
Integrated Behavioral Health via Telemedicine Visit  12/06/2021 HALDON BARLET BN:[id number]  Number of [place] Clinician visits: 48 Session Start time: 1000 Session End time: 1030 Total time in minutes: 30 min  Referring Provider: Dr. Jodell Cipro, MD Patient/Family location: Pt is home in private [facility] Provider location: Working remote All persons participating in visit: Pt & Clinician Types of Service: Individual psychotherapy  I connected with Samuel Decker and/or Samuel Decker's  self  via  Telephone or Video Enabled Telemedicine Application  (Video is Caregility application) and verified that I am speaking with the correct person using two identifiers. Discussed confidentiality: Yes   I discussed the limitations of telemedicine and the availability of in person appointments.  Discussed there is a possibility of technology failure and discussed alternative modes of communication if that failure occurs.  I discussed that engaging in this telemedicine visit, they consent to the provision of behavioral healthcare and the services will be billed under their insurance.  Patient and/or legal guardian expressed understanding and consented to Telemedicine visit: Yes   Presenting Concerns: Patient and/or family reports the following symptoms/concerns: elevated anx/dep due to recent loss of vehicle for transport Duration of problem: a few wks now; Severity of problem: moderate  Patient and/or Family's Strengths/Protective Factors: Social connections, Concrete supports in place (healthy food, safe environments, etc.), and Sense of purpose  Goals Addressed: Patient will:  Reduce symptoms of: anxiety, depression, and stress   Increase knowledge and/or ability of: coping skills, healthy habits, and stress reduction   Demonstrate ability to: Increase healthy adjustment to current life circumstances  Progress towards Goals: Ongoing  Interventions: Interventions  utilized:  Supportive Counseling and Psychoeducation and/or Health Education Standardized Assessments completed:  screeners prn  Patient and/or Family Response: Pt receptive to call today & is deciding about future psychotherapy.  Assessment: Patient currently experiencing difficulties w/his 12yo Son's decision to spend more time w/Pt. Ex-Wife has not facilitated the relationship w/Pt & Son.   Patient may benefit from taking charge of the Co-Parenting situation.  Plan: Follow up with behavioral health clinician on : TBD Behavioral recommendations: Cont to assist & Parent your Son in positive ways. Referral(s): [facility] (In Clinic)  I discussed the assessment and treatment plan with the patient and/or parent/guardian. They were provided an opportunity to ask questions and all were answered. They agreed with the plan and demonstrated an understanding of the instructions.   They were advised to call back or seek an in-person evaluation if the symptoms worsen or if the condition fails to improve as anticipated.  Donnetta Hutching, LMFT

## 2022-01-01 NOTE — Progress Notes (Signed)
Internal Medicine Clinic Attending  Case discussed with Dr. Amponsah soon after the resident saw the patient.  I reviewed the AWV findings.  I agree with the assessment, diagnosis, and plan of care documented in the AWV note.     

## 2022-01-22 ENCOUNTER — Ambulatory Visit (INDEPENDENT_AMBULATORY_CARE_PROVIDER_SITE_OTHER): Payer: Medicare HMO | Admitting: Neurology

## 2022-01-22 ENCOUNTER — Encounter: Payer: Self-pay | Admitting: Neurology

## 2022-01-22 VITALS — BP 144/83 | HR 69 | Ht 73.0 in | Wt 284.6 lb

## 2022-01-22 DIAGNOSIS — R519 Headache, unspecified: Secondary | ICD-10-CM

## 2022-01-22 DIAGNOSIS — R0683 Snoring: Secondary | ICD-10-CM | POA: Diagnosis not present

## 2022-01-22 DIAGNOSIS — R030 Elevated blood-pressure reading, without diagnosis of hypertension: Secondary | ICD-10-CM | POA: Diagnosis not present

## 2022-01-22 DIAGNOSIS — G4730 Sleep apnea, unspecified: Secondary | ICD-10-CM

## 2022-01-22 DIAGNOSIS — Z820 Family history of epilepsy and other diseases of the nervous system: Secondary | ICD-10-CM | POA: Diagnosis not present

## 2022-01-22 DIAGNOSIS — G4719 Other hypersomnia: Secondary | ICD-10-CM

## 2022-01-22 DIAGNOSIS — E669 Obesity, unspecified: Secondary | ICD-10-CM

## 2022-01-22 DIAGNOSIS — R634 Abnormal weight loss: Secondary | ICD-10-CM

## 2022-01-22 NOTE — Patient Instructions (Signed)

## 2022-01-22 NOTE — Progress Notes (Signed)
Subjective:    Patient ID: Samuel Decker is a 41 y.o. male.  HPI    Huston Foley, MD, PhD [facility] [address] [address] [place], [place] [address]  Dear Drs. Glenard Haring,   I saw your patient, Samuel Decker, upon your kind request in my sleep clinic today for initial consultation of his sleep disorder, in particular, concern for underlying obstructive sleep apnea.  The patient is unaccompanied today.  As you know, Samuel Decker is a 41 year old right-handed gentleman with an underlying medical history of hypertension, asthma, hyperlipidemia, keratoconus, legal blindness (chart review), history of syphilis (treated), and obesity, who reports snoring and elevated blood pressure values.  I reviewed your office note from 11/30/2021.  He has been working on weight loss and has lost over 30 pounds in the past several months. He had recurrent headaches in the past, none in the recent past.  He does have nocturia about twice per average night.  He currently lives alone, his friend of 20 years moved to [place].  He has no pets in the household.  He works Marketing executive.  Bedtime is generally around 10:30 PM and rise time around 7 AM.  He had asthma as a child.  His dad had sleep apnea and had a CPAP machine.  Patient smokes between third of a pack to half pack per day, is trying to quit.  He drinks alcohol about twice a week, 1 or 2 glasses of wine.  He does not drink caffeine daily.  He has occasionally woken up with a sense of gasping and shortness of breath.  His Past Medical History Is Significant For: Past Medical History:  Diagnosis Date   Asthma    Childhood, not active   CHEST PAIN UNSPECIFIED 05/03/2010   Qualifier: Diagnosis of  By: Huntley Dec, Scott     ELECTROCARDIOGRAM, ABNORMAL 05/03/2010   Qualifier: Diagnosis of  By: Huntley Dec, Scott     Gout    Most recent flare 01/2018   HYPERLIPIDEMIA 05/03/2010   Qualifier: Diagnosis of  By:  Huntley Dec, Scott     Hypertension    Keratoconus of both eyes    Legally blind    Syphilis 10/20/2020   Diagnosed 12/2018 and treated.     His Past Surgical History Is Significant For: Past Surgical History:  Procedure Laterality Date   "groin surgery"     EYE SURGERY     Penetrating Keratoplasty    His Family History Is Significant For: Family History  Problem Relation Age of Onset   Hypertension Mother    Hypertension Father    Diabetes Father    Heart disease Father    Sleep apnea Father    Hypertension Paternal Grandmother    Diabetes Paternal Grandmother     His Social History Is Significant For: Social History   Socioeconomic History   Marital status: Divorced    Spouse name: Not on file   Number of children: Not on file   Years of education: Not on file   Highest education level: Not on file  Occupational History   Occupation: diability  Tobacco Use   Smoking status: Every Day    Packs/day: 0.50    Years: 3.00    Total pack years: 1.50    Types: Cigarettes   Smokeless tobacco: Never   Tobacco comments:    No smoking in 1.5 days   Vaping Use   Vaping Use: Never used  Substance and  Sexual Activity   Alcohol use: Yes    Alcohol/week: 4.0 standard drinks of alcohol    Types: 4 Glasses of wine per week    Comment: occ   Drug use: Yes    Types: Marijuana   Sexual activity: Not on file  Other Topics Concern   Not on file  Social History Narrative   Not on file   Social Determinants of Health   Financial Resource Strain: Low Risk  (11/30/2021)   Overall Financial Resource Strain (CARDIA)    Difficulty of Paying Living Expenses: Not hard at all  Food Insecurity: No Food Insecurity (11/30/2021)   Hunger Vital Sign    Worried About Running Out of Food in the Last Year: Never true    Ran Out of Food in the Last Year: Never true  Transportation Needs: No Transportation Needs (11/30/2021)   PRAPARE - Administrator, Civil Service (Medical): No     Lack of Transportation (Non-Medical): No  Physical Activity: Insufficiently Active (11/30/2021)   Exercise Vital Sign    Days of Exercise per Week: 1 day    Minutes of Exercise per Session: 30 min  Stress: No Stress Concern Present (11/30/2021)   [employer] of Occupational Health - Occupational Stress Questionnaire    Feeling of Stress : Not at all  Social Connections: Moderately Integrated (11/30/2021)   Social Connection and Isolation Panel [NHANES]    Frequency of Communication with Friends and Family: More than three times a week    Frequency of Social Gatherings with Friends and Family: More than three times a week    Attends Religious Services: More than 4 times per year    Active Member of [employer] or Organizations: Yes    Attends Engineer, structural: More than 4 times per year    Marital Status: Never married    His Allergies Are:  Allergies  Allergen Reactions   Buprenorphine Hcl Itching and Swelling   Morphine And Related Itching and Swelling  :   His Current Medications Are:  Outpatient Encounter Medications as of 01/22/2022  Medication Sig   amLODipine-atorvastatin (CADUET) 10-80 MG tablet Take 1 tablet by mouth daily.   aspirin 81 MG chewable tablet Chew 1 tablet (81 mg total) by mouth daily.   benazepril (LOTENSIN) 20 MG tablet Take 1 tablet (20 mg total) by mouth daily.   fluticasone (FLONASE) 50 MCG/ACT nasal spray INSTILL 1 SPRAY INTO BOTH NOSTRILS DAILY   ibuprofen (ADVIL) 600 MG tablet Take 1 tablet (600 mg total) by mouth every 6 (six) hours as needed.   loratadine (CLARITIN) 10 MG tablet Take 1 tablet (10 mg total) by mouth daily as needed for allergies.   metoprolol tartrate (LOPRESSOR) 50 MG tablet Take 1 tablet (50 mg total) by mouth 2 (two) times daily.   olopatadine (PATANOL) 0.1 % ophthalmic solution Place 1 drop into both eyes daily.   Semaglutide, 2 MG/DOSE, (OZEMPIC, 2 MG/DOSE,) 8 MG/3ML SOPN Inject 2 mg into the skin once a week.    emtricitabine-tenofovir (TRUVADA) 200-300 MG tablet Take 1 tablet by mouth daily. (Patient not taking: Reported on 01/22/2022)   No facility-administered encounter medications on file as of 01/22/2022.  :   Review of Systems:  Out of a complete 14 point review of systems, all are reviewed and negative with the exception of these symptoms as listed below:  Review of Systems  Neurological:        Pt here for sleep consult  Pt  snores,fatigue,headaches,hypertension. Pt denies sleep study,CPAP machine    ESS:3 FSS:16    Objective:  Neurological Exam  Physical Exam Physical Examination:   Vitals:   01/22/22 0824  BP: (!) 144/83  Pulse: 69    General Examination: The patient is a very pleasant 41 y.o. male in no acute distress. He appears well-developed and well-nourished and well groomed.   HEENT: Normocephalic, atraumatic, pupils are equal, round and reactive to light, corrective eyeglasses in place.  Hearing is grossly intact. Face is symmetric with normal facial animation. Speech is clear with no dysarthria noted. There is no hypophonia. There is no lip, neck/head, jaw or voice tremor. Neck is supple with full range of passive and active motion. There are no carotid bruits on auscultation. Oropharynx exam reveals: mild mouth dryness, adequate dental hygiene and moderate airway crowding, due to tonsillar size of 1-2+ bilaterally, larger appearing uvula, wider tongue.  Mallampati class III.  Neck circumference 19 three-quarter inches.  Tongue protrudes centrally and palate elevates symmetrically, slight underbite noted.  Chest: Clear to auscultation without wheezing, rhonchi or crackles noted.  Heart: S1+S2+0, regular and normal without murmurs, rubs or gallops noted.   Abdomen: Soft, non-tender and non-distended with normal bowel sounds appreciated on auscultation.  Extremities: There is no pitting edema in the distal lower extremities bilaterally.   Skin: Warm and dry without  trophic changes noted.   Musculoskeletal: exam reveals no obvious joint deformities.   Neurologically:  Mental status: The patient is awake, alert and oriented in all 4 spheres. His immediate and remote memory, attention, language skills and fund of knowledge are appropriate. There is no evidence of aphasia, agnosia, apraxia or anomia. Speech is clear with normal prosody and enunciation. Thought process is linear. Mood is normal and affect is normal.  Cranial nerves II - XII are as described above under HEENT exam.  Motor exam: Normal bulk, strength and tone is noted. There is no obvious tremor. Fine motor skills and coordination: grossly intact.  Cerebellar testing: No dysmetria or intention tremor. There is no truncal or gait ataxia.  Sensory exam: intact to light touch in the upper and lower extremities.  Gait, station and balance: He stands easily. No veering to one side is noted. No leaning to one side is noted. Posture is age-appropriate and stance is narrow based. Gait shows normal stride length and normal pace. No problems turning are noted.   Assessment and Plan:   In summary, Samuel Decker is a very pleasant 41 y.o.-year old male with an underlying medical history of hypertension, asthma, hyperlipidemia, keratoconus, legal blindness (chart review), history of syphilis (treated), and obesity, whose history and physical exam concerning for sleep disordered breathing, supporting a current working diagnosis of unspecified sleep apnea, with the main differential diagnoses of obstructive sleep apnea (OSA) versus upper airway resistance syndrome (UARS) versus central sleep apnea (CSA), or mixed sleep apnea. A laboratory attended sleep study is considered gold standard for evaluation of sleep disordered breathing and is recommended at this time and clinically justified.   I had a long chat with the patient about my findings and the diagnosis of sleep apnea, particularly OSA, its prognosis and  treatment options. We talked about medical/conservative treatments, surgical interventions and non-pharmacological approaches for symptom control. I explained, in particular, the risks and ramifications of untreated moderate to severe OSA, especially with respect to developing cardiovascular disease down the road, including congestive heart failure (CHF), difficult to treat hypertension, cardiac arrhythmias (particularly A-fib), neurovascular complications including  TIA, stroke and dementia. Even type 2 diabetes has, in part, been linked to untreated OSA. Symptoms of untreated OSA may include (but may not be limited to) daytime sleepiness, nocturia (i.e. frequent nighttime urination), memory problems, mood irritability and suboptimally controlled or worsening mood disorder such as depression and/or anxiety, lack of energy, lack of motivation, physical discomfort, as well as recurrent headaches, especially morning or nocturnal headaches. We talked about the importance of maintaining a healthy lifestyle and striving for healthy weight.  The importance of complete smoking cessation was also addressed. I recommended the following at this time: sleep study.  I outlined the differences between a laboratory attended sleep study which is considered more comprehensive and accurate over the option of a home sleep test (HST); the latter may lead to underestimation of sleep disordered breathing in some instances and does not help with diagnosing upper airway resistance syndrome and is not accurate enough to diagnose primary central sleep apnea typically. I explained the different sleep test procedures to the patient in detail and also outlined possible surgical and non-surgical treatment options of OSA, including the use of a pressure airway pressure (PAP) device (ie CPAP, AutoPAP/APAP or BiPAP in certain circumstances), a custom-made dental device (aka oral appliance, which would require a referral to a specialist dentist or  orthodontist typically, and is generally speaking not considered a good choice for patients with full dentures or edentulous state), upper airway surgical options, such as traditional UPPP (which is not considered a first-line treatment) or the Inspire device (hypoglossal nerve stimulator, which would involve a referral for consultation with an ENT surgeon, after careful selection, following inclusion criteria). I explained the PAP treatment option to the patient in detail, as this is generally considered first-line treatment.  The patient indicated that he would be willing to try PAP therapy, if the need arises. I explained the importance of being compliant with PAP treatment, not only for insurance purposes but primarily to improve patient's symptoms symptoms, and for the patient's long term health benefit, including to reduce His cardiovascular risks longer-term.    We will pick up our discussion about the next steps and treatment options after testing.  We will keep him posted as to the test results by phone call and/or MyChart messaging where possible.  We will plan to follow-up in sleep clinic accordingly as well.  I answered all his questions today and the patient was in agreement.   I encouraged him to call with any interim questions, concerns, problems or updates or email [place] through MyChart.  Generally speaking, sleep test authorizations may take up to 2 weeks, sometimes less, sometimes longer, the patient is encouraged to get in touch with [place] if they do not hear back from the sleep lab staff directly within the next 2 weeks.  Thank you very much for allowing me to participate in the care of this nice patient. If I can be of any further assistance to you please do not hesitate to call me at [phone].  Sincerely,   Huston Foley, MD, PhD

## 2022-01-29 ENCOUNTER — Telehealth: Payer: Self-pay

## 2022-01-29 NOTE — Telephone Encounter (Signed)
LVM for pt to call back to schedule sleep study.  

## 2022-01-30 NOTE — Telephone Encounter (Signed)
X2 lvm for pt to call back to schedule.  

## 2022-01-30 NOTE — Telephone Encounter (Signed)
[employer] medicare Berkley Harvey: [id number] (exp. 01/24/22 to 07/23/22)

## 2022-01-31 NOTE — Telephone Encounter (Signed)
Patient returned my call he is scheduled at [facility] for 02/20/22 at 8 pm.  NPSG- [employer] medicare Berkley Harvey: [id number] (exp. 01/24/22 to 07/23/22)   Mailed packet to the patient.

## 2022-02-03 ENCOUNTER — Other Ambulatory Visit: Payer: Self-pay | Admitting: Student

## 2022-02-06 ENCOUNTER — Other Ambulatory Visit: Payer: Self-pay | Admitting: Student

## 2022-02-12 ENCOUNTER — Ambulatory Visit: Payer: Self-pay | Admitting: Licensed Clinical Social Worker

## 2022-02-12 ENCOUNTER — Telehealth: Payer: Self-pay | Admitting: Licensed Clinical Social Worker

## 2022-02-12 NOTE — Patient Outreach (Signed)
  Care Coordination   02/12/2022 Name: Samuel Decker MRN: [id number] DOB: 10-26-1980   Care Coordination Outreach Attempts:  An unsuccessful telephone outreach was attempted today to offer the patient information about available care coordination services as a benefit of their health plan.   Follow Up Plan:  Additional outreach attempts will be made to offer the patient care coordination information and services.   Encounter Outcome:  Pt. Scheduled  Care Coordination Interventions Activated:  No   Care Coordination Interventions:  No, not indicated    Ander Gaster , MSW Social Worker [facility] Care Management  [phone]

## 2022-02-12 NOTE — Patient Outreach (Signed)
  [facility]) Care Management  [facility] Social Work  02/12/2022  CAP MASSI 12/22/1980 [id number]  Subjective:  Chart Review  Objective: SW reviewed chart in encounter to prepare for visit.   Encounter Medications:  Outpatient Encounter Medications as of 02/12/2022  Medication Sig   amLODipine-atorvastatin (CADUET) 10-80 MG tablet Take 1 tablet by mouth daily.   aspirin 81 MG chewable tablet Chew 1 tablet (81 mg total) by mouth daily.   benazepril (LOTENSIN) 20 MG tablet TAKE 1 TABLET BY MOUTH EVERY DAY   emtricitabine-tenofovir (TRUVADA) 200-300 MG tablet Take 1 tablet by mouth daily.   Exenatide ER (BYDUREON BCISE) 2 MG/0.85ML AUIJ Inject 2 mg as directed once a week.   fluticasone (FLONASE) 50 MCG/ACT nasal spray INSTILL 1 SPRAY INTO BOTH NOSTRILS DAILY   ibuprofen (ADVIL) 600 MG tablet Take 1 tablet (600 mg total) by mouth every 6 (six) hours as needed.   loratadine (CLARITIN) 10 MG tablet Take 1 tablet (10 mg total) by mouth daily as needed for allergies.   metoprolol tartrate (LOPRESSOR) 50 MG tablet Take 1 tablet (50 mg total) by mouth 2 (two) times daily.   olopatadine (PATANOL) 0.1 % ophthalmic solution Place 1 drop into both eyes daily.   No facility-administered encounter medications on file as of 02/12/2022.    Functional Status:     11/30/2021    9:56 AM 11/30/2021    9:05 AM  In your present state of health, do you have any difficulty performing the following activities:  Hearing? 0 0  Vision? 0 0  Difficulty concentrating or making decisions? 0 0  Walking or climbing stairs? 0 0  Dressing or bathing? 0 0  Doing errands, shopping? 0 0  Preparing Food and eating ? N   Using the Toilet? N   In the past six months, have you accidently leaked urine? N   Do you have problems with loss of bowel control? N   Managing your Medications? N   Managing your Finances? N   Housekeeping or managing your Housekeeping? N     Fall/Depression Screening:      11/30/2021    9:53 AM 11/30/2021    9:04 AM 08/11/2021    9:26 AM 08/11/2021    9:20 AM 07/11/2021    9:47 AM 06/08/2021   10:45 AM 04/27/2021   10:00 AM  PHQ 2/9 Scores  PHQ - 2 Score 0 0 0 0 0 0 0  PHQ- 9 Score 0 0  0 0 0 0    Assessment:  Care Plan There are no care plans that you recently modified to display for this patient.    Goals Addressed             This Visit's Progress    MSW Chart review       SW reviewed chart in encounter.         Plan:  Follow-up:  SW will attempt outreach  Christen Butter, Kenard Gower, MSW  Social Worker [facility] Care Management  [phone]

## 2022-02-20 ENCOUNTER — Ambulatory Visit (INDEPENDENT_AMBULATORY_CARE_PROVIDER_SITE_OTHER): Payer: Medicare HMO | Admitting: Neurology

## 2022-02-20 DIAGNOSIS — G4733 Obstructive sleep apnea (adult) (pediatric): Secondary | ICD-10-CM

## 2022-02-20 DIAGNOSIS — G4720 Circadian rhythm sleep disorder, unspecified type: Secondary | ICD-10-CM

## 2022-02-20 DIAGNOSIS — R030 Elevated blood-pressure reading, without diagnosis of hypertension: Secondary | ICD-10-CM

## 2022-02-20 DIAGNOSIS — R519 Headache, unspecified: Secondary | ICD-10-CM

## 2022-02-20 DIAGNOSIS — G4719 Other hypersomnia: Secondary | ICD-10-CM

## 2022-02-20 DIAGNOSIS — G4730 Sleep apnea, unspecified: Secondary | ICD-10-CM

## 2022-02-20 DIAGNOSIS — R0683 Snoring: Secondary | ICD-10-CM

## 2022-02-20 DIAGNOSIS — R634 Abnormal weight loss: Secondary | ICD-10-CM

## 2022-02-20 DIAGNOSIS — E669 Obesity, unspecified: Secondary | ICD-10-CM

## 2022-02-20 DIAGNOSIS — Z820 Family history of epilepsy and other diseases of the nervous system: Secondary | ICD-10-CM

## 2022-02-28 NOTE — Procedures (Signed)
[facility] POLYSOMNOGRAPHY  INTERPRETATION REPORT   STUDY DATE:  02/20/2022     PATIENT NAME:  Samuel Decker         DATE OF BIRTH:  May 01, 1981  PATIENT ID:  [id number]    TYPE OF STUDY:  PSG  READING PHYSICIAN: Huston Foley, MD, PhD SCORING TECHNICIAN: [place], RPSGT   INDICATIONS: 41 year old right-handed gentleman with an underlying medical history of hypertension, asthma, hyperlipidemia, keratoconus, legal blindness (per chart review), history of syphilis (treated), and obesity, who reports snoring and elevated blood pressure values. He has been working on weight loss and has lost over 30 pounds in the past several months. The Epworth Sleepiness Scale was 3 out of 24 (scores above or equal to 10 are suggestive of hypersomnolence). ADDITIONAL INFORMATION:  Height: 73 in Weight: 284 lb (BMI 37) Neck Size: 20 in Medications: Caduet, Aspirin, Lotensin, Truvada, Flonase, Advil, Claritin, Lopressor, Patanol, Ozempic  DESCRIPTION: A sleep technologist was in attendance for the duration of the recording.  Data collection, scoring, video monitoring, and reporting were performed in compliance with the AASM Manual for the Scoring of Sleep and Associated Events; (Hypopnea is scored based on the criteria listed in Section VIII D. 1b in the AASM Manual V2.6 using a 4% oxygen desaturation rule or Hypopnea is scored based on the criteria listed in Section VIII D. 1a in the AASM Manual V2.6 using 3% oxygen desaturation and /or arousal rule).  A physician certified by the American Board of Sleep Medicine reviewed each epoch of the study.  FINDINGS:  Please refer to the attached summary for additional quantitative information.  SLEEP CONTINUITY AND SLEEP ARCHITECTURE:  Lights out was at 20:43: and lights on 05:01: (8.3 hours in bed). Total sleep time was 316.5 minutes (37.1% supine;  62.9% lateral;  0% prone, 18.3% REM sleep), with a decreased sleep efficiency at 63.6%.  Sleep latency was increased at 96.5 minutes.  REM sleep latency was increased at 187.0 minutes. Of the total sleep time, the percentage of stage N1 sleep was 4.7%, stage N2 sleep was 77%, which is increased, stage N3 sleep was absent, and REM sleep was 18.3%, which is near-normal. There were 2 Stage R periods observed on this study night, 25 awakenings (i.e. transitions to Stage W from any sleep stage), and 67 total stage transitions. Wake after sleep onset (WASO) time accounted for 83.5 minutes with mild to moderate sleep fragmentation noted.  AROUSAL: There were 21 arousals in total, for an arousal index of 4 arousals/hour.  Of these, 8 were identified as respiratory-related arousals (2 /hr), 0 were PLM-related arousals (0 /hr), and 32 were non-specific arousals (6 /hr)  RESPIRATORY MONITORING:  Based on CMS criteria (using a 4% oxygen desaturation rule for scoring hypopneas), there were 7 apneas (7 obstructive; 0 central; 0 mixed), and 52 hypopneas.  Apnea index was 1.3. Hypopnea index was 9.9. The apnea-hypopnea index was 11.2/hour overall (11.7 supine, 49 non-supine; 50.7/hour in REM sleep, 53.1 supine REM). There were 0 respiratory effort-related arousals (RERAs).  The RERA index was 0 events/hr. Total respiratory disturbance index (RDI) was 11.2 events/hr. RDI results showed: supine RDI  11.7 /hr; non-supine RDI 10.9 /hr; REM RDI 50.7 /hr, supine REM RDI 53.1 /hr.   Based on AASM criteria (using a 3% oxygen desaturation and /or arousal rule for scoring hypopneas), there were 7 apneas (7 obstructive; 0 central; 0 mixed), and 53 hypopneas. Apnea index was 1.3. Hypopnea index was 10.0. The apnea-hypopnea index was  11.4 overall (11.7 supine, 51 non-supine; 51.7 REM, 53.1 supine REM). There were 0 respiratory effort-related arousals (RERAs).  The RERA index was 0 events/hr. Total respiratory disturbance index (RDI) was 11.4 events/hr. RDI results showed: supine RDI  11.7 /hr; non-supine RDI 11.2 /hr; REM RDI  51.7 /hr, supine REM RDI 53.1 /hr.  Respiratory events were associated with oxyhemoglobin desaturations (nadir during sleep 84%) from a mean of 96%). There were 0 occurrences of Cheyne Stokes breathing.   OXIMETRY: Total sleep time spent at, or below 88% was 1.0 minutes, or 0.3% of total sleep time. Snoring was classified as intermittent, mild to moderate. BODY POSITION: Duration of total sleep and percent of total sleep in their respective position is as follows: supine 117 minutes (37%), non-supine 199 minutes (63%); right 121 minutes (38%), left 77 minutes (24%), and prone 00 minutes (0%). Total supine REM sleep time was 26 minutes (45% of total REM sleep).  LIMB MOVEMENTS: There were 0 periodic limb movements of sleep (0.0/hr), of which 0 (0.0/hr) were associated with an arousal.  EEG: With the limited montage recorded, no EEG abnormalities were observed. CARDIAC: The electrocardiogram showed normal sinus rhythm.  The average heart rate during sleep was 69 bpm.  The maximum heart rate during sleep was 86 bpm. The maximum heart rate during recording was 95.  BEHAVIORAL: No significant parasomnia behavior noted. The video and audio analysis did not show any abnormal or unusual behaviors, movements, phonations or vocalizations. The patient took 2 bathroom breaks. Post study, the patient indicated, that sleep was the same as usual. IMPRESSION and DIAGNOSES:   1. Obstructive Sleep Apnea (OSA) 2. Dysfunctions associated with sleep stages or arousal from sleep RECOMMENDATIONS: 1. This study demonstrates overall mild obstructive sleep apnea, severe in REM sleep with a total AHI of 11.2/hour, REM AHI of 50.7/hour, and O2 nadir of 84% (during REM sleep). Given the patient's medical history and sleep related complaints, treatment with positive airway pressure is recommended; this can be achieved in the form of autoPAP. Alternatively, a full-night CPAP titration study would allow optimization of therapy if  needed. Other treatment options may include avoidance of supine sleep position along with ongoing weight loss, or the use of an oral appliance in certain patients. Please note that untreated obstructive sleep apnea may carry additional perioperative morbidity. Patients with significant obstructive sleep apnea should receive perioperative PAP therapy and the surgeons and particularly the anesthesiologist should be informed of the diagnosis and the severity of the sleep disordered breathing. 2. The patient should be cautioned not to drive, work at heights, or operate dangerous or heavy equipment when tired or sleepy. Review and reiteration of good sleep hygiene measures should be pursued with any patient. 3. The patient will be seen in follow-up by Dr. Frances Furbish at [facility] for discussion of the test results and further management strategies. The referring provider will be notified of the test results.  I certify that I have reviewed the entire raw data recording prior to the issuance of this report in accordance with the Standards of Accreditation of the American Academy of Sleep Medicine (AASM). Huston Foley,  MD, PhD

## 2022-02-28 NOTE — Addendum Note (Signed)
Addended by: Huston Foley on: 02/28/2022 02:21 PM   Modules accepted: Orders

## 2022-03-06 ENCOUNTER — Telehealth: Payer: Self-pay | Admitting: *Deleted

## 2022-03-06 DIAGNOSIS — G4733 Obstructive sleep apnea (adult) (pediatric): Secondary | ICD-10-CM

## 2022-03-06 NOTE — Telephone Encounter (Signed)
Pt returned phone call, would like a call back.  

## 2022-03-06 NOTE — Telephone Encounter (Signed)
-----   Message from Huston Foley, MD sent at 02/28/2022  2:21 PM EDT ----- Patient referred by Dr. Kirke Corin, seen by me on 01/22/22, diagnostic PSG on 02/20/22.    Please call and notify the patient that the recent sleep study showed evidence of obstructive sleep apnea. OSA is overall mild, but severe during dream/REM sleep and worth treating to see if he feels better after treatment. To that end I recommend treatment for this in the form of autoPAP, which means, that we don't have to bring him back for a second sleep study with CPAP, but will let him try an autoPAP machine at home, through a DME company (of his choice, or as per insurance requirement). The DME representative will educate him on how to use the machine, how to put the mask on, etc. I have placed an order in the chart. Please send referral, talk to patient, send report to referring MD. We will need a FU in sleep clinic for 10 weeks post-PAP set up, please arrange that with me or one of our NPs. Thanks,   Huston Foley, MD, PhD [facility])

## 2022-03-06 NOTE — Telephone Encounter (Signed)
Called pt & LVM with office number asking for call back.  

## 2022-03-07 NOTE — Telephone Encounter (Signed)
Called pt & LVM with office number asking for call back.  

## 2022-03-08 NOTE — Telephone Encounter (Signed)
Spoke with patient and discussed sleep study results. Pt is amenable to proceeding with autopap therapy. His questions were answered. Discussed DME. Will use [facility]. Pt aware of insurance compliance requirements which includes using the machine at least 4 hours at night and also being seen in the office between 30 and 90 days after setup. Pt scheduled an initial f/u appt for Monday 05/21/22 at 9:45 AM. Pt will come by the office to sign a release form for his sleep study so he can get the results on paper. He was very appreciative for the call.   Order for autopap, sleep study, office note, insurance info all faxed to [facility]. Received a receipt of confirmation. F/u letter sent to patient via mychart.

## 2022-03-08 NOTE — Addendum Note (Signed)
Addended by: Bertram Savin on: 03/08/2022 11:25 AM   Modules accepted: Orders

## 2022-03-30 ENCOUNTER — Other Ambulatory Visit: Payer: Self-pay | Admitting: Student

## 2022-04-11 DIAGNOSIS — G4733 Obstructive sleep apnea (adult) (pediatric): Secondary | ICD-10-CM | POA: Diagnosis not present

## 2022-04-11 DIAGNOSIS — I10 Essential (primary) hypertension: Secondary | ICD-10-CM | POA: Diagnosis not present

## 2022-05-12 DIAGNOSIS — I10 Essential (primary) hypertension: Secondary | ICD-10-CM | POA: Diagnosis not present

## 2022-05-12 DIAGNOSIS — G4733 Obstructive sleep apnea (adult) (pediatric): Secondary | ICD-10-CM | POA: Diagnosis not present

## 2022-05-15 ENCOUNTER — Other Ambulatory Visit: Payer: Self-pay | Admitting: Student

## 2022-05-15 DIAGNOSIS — I10 Essential (primary) hypertension: Secondary | ICD-10-CM

## 2022-05-15 DIAGNOSIS — E782 Mixed hyperlipidemia: Secondary | ICD-10-CM

## 2022-05-15 MED ORDER — AMLODIPINE-ATORVASTATIN 10-80 MG PO TABS: 1.0000 | ORAL_TABLET | Freq: Every day | ORAL | 3 refills | Status: DC

## 2022-05-15 NOTE — Progress Notes (Signed)
Sent refills of his amlodipine-atorvastatin to his pharmacy

## 2022-05-21 ENCOUNTER — Ambulatory Visit: Payer: Medicare HMO | Admitting: Neurology

## 2022-07-17 ENCOUNTER — Encounter: Payer: Medicare HMO | Admitting: Student

## 2022-09-20 ENCOUNTER — Encounter: Payer: Self-pay | Admitting: Student

## 2022-10-18 ENCOUNTER — Ambulatory Visit (INDEPENDENT_AMBULATORY_CARE_PROVIDER_SITE_OTHER): Payer: Self-pay | Admitting: Student

## 2022-10-18 ENCOUNTER — Other Ambulatory Visit (HOSPITAL_COMMUNITY): Payer: Self-pay

## 2022-10-18 ENCOUNTER — Encounter: Payer: Self-pay | Admitting: Student

## 2022-10-18 VITALS — BP 184/111 | HR 69 | Temp 98.2°F | Ht 73.0 in | Wt 320.5 lb

## 2022-10-18 DIAGNOSIS — I158 Other secondary hypertension: Secondary | ICD-10-CM

## 2022-10-18 DIAGNOSIS — Z6841 Body Mass Index (BMI) 40.0 and over, adult: Secondary | ICD-10-CM

## 2022-10-18 DIAGNOSIS — F419 Anxiety disorder, unspecified: Secondary | ICD-10-CM

## 2022-10-18 DIAGNOSIS — I10 Essential (primary) hypertension: Secondary | ICD-10-CM

## 2022-10-18 DIAGNOSIS — F32A Depression, unspecified: Secondary | ICD-10-CM

## 2022-10-18 DIAGNOSIS — G4733 Obstructive sleep apnea (adult) (pediatric): Secondary | ICD-10-CM

## 2022-10-18 DIAGNOSIS — F331 Major depressive disorder, recurrent, moderate: Secondary | ICD-10-CM

## 2022-10-18 MED ORDER — AMLODIPINE BESYLATE 10 MG PO TABS
10.0000 mg | ORAL_TABLET | Freq: Every day | ORAL | 11 refills | Status: AC
  Filled 2022-10-18: qty 30, 30d supply, fill #0
  Filled 2022-12-14: qty 30, 30d supply, fill #1
  Filled 2023-03-12: qty 30, 30d supply, fill #2
  Filled 2023-04-12: qty 30, 30d supply, fill #3
  Filled 2023-06-05 – 2023-06-27 (×2): qty 30, 30d supply, fill #4
  Filled 2023-08-29 – 2023-09-10 (×2): qty 30, 30d supply, fill #5

## 2022-10-18 MED ORDER — LISINOPRIL 20 MG PO TABS
20.0000 mg | ORAL_TABLET | Freq: Every day | ORAL | 5 refills | Status: AC
  Filled 2022-10-18: qty 30, 30d supply, fill #0
  Filled 2022-12-14: qty 30, 30d supply, fill #1
  Filled 2023-03-12: qty 30, 30d supply, fill #2
  Filled 2023-04-12: qty 30, 30d supply, fill #3
  Filled 2023-06-05 – 2023-06-27 (×2): qty 30, 30d supply, fill #4
  Filled 2023-08-29 – 2023-09-10 (×2): qty 30, 30d supply, fill #5

## 2022-10-18 MED ORDER — METOPROLOL TARTRATE 50 MG PO TABS
50.0000 mg | ORAL_TABLET | Freq: Two times a day (BID) | ORAL | 3 refills | Status: DC
Start: 2022-10-18 — End: 2023-06-05
  Filled 2022-10-18: qty 60, 30d supply, fill #0
  Filled 2022-12-14: qty 60, 30d supply, fill #1
  Filled 2023-03-12: qty 60, 30d supply, fill #2
  Filled 2023-04-12: qty 60, 30d supply, fill #3

## 2022-10-18 MED ORDER — LOSARTAN POTASSIUM 25 MG PO TABS
25.0000 mg | ORAL_TABLET | Freq: Every day | ORAL | 11 refills | Status: DC
  Filled 2022-10-18: qty 30, 30d supply, fill #0

## 2022-10-18 MED ORDER — ATORVASTATIN CALCIUM 40 MG PO TABS
40.0000 mg | ORAL_TABLET | Freq: Every day | ORAL | 11 refills | Status: AC
  Filled 2022-10-18: qty 30, 30d supply, fill #0
  Filled 2022-12-14: qty 30, 30d supply, fill #1
  Filled 2023-03-12: qty 30, 30d supply, fill #2
  Filled 2023-04-12: qty 30, 30d supply, fill #3
  Filled 2023-06-05 – 2023-06-27 (×2): qty 30, 30d supply, fill #4
  Filled 2023-08-29 – 2023-09-10 (×2): qty 30, 30d supply, fill #5

## 2022-10-18 NOTE — Progress Notes (Signed)
CC: Follow-up  HPI:  [name] is a 42 y.o. male with PMH as below who presents to clinic to follow-up on his chronic medical problems. Please see problem based charting for evaluation, assessment and plan.  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     Review of Systems:  Constitutional: Positive for weight gain and fatigue Eyes: Negative for visual changes CV: Negative for chest pain or palpitations Abdomen: Negative for abdominal pain, constipation or diarrhea Neuro: Negative for headache or weakness Psych: Positive for depression  Physical Exam: General: Pleasant, well-appearing obese male.  No acute distress. Cardiac: RRR. No murmurs, rubs or gallops. No LE edema Respiratory: Lungs CTAB. No wheezing or crackles. Abdominal: Abdominal obesity. Non-tender. Normal BS. Skin: Warm, dry and intact without rashes or lesions Extremities: Atraumatic. Full ROM. Palpable radial and DP pulses. Neuro: A&O x 3. Moves all extremities. Normal sensation to gross touch. Psych: Appropriate mood and affect.  Vitals:   10/18/22 1553 10/18/22 1636  BP: (!) 169/123 (!) 184/111  Pulse: 75 69  Temp: 98.2 F (36.8 C)   TempSrc: Oral   SpO2: 100%   Weight: (!) 320 lb 8 oz (145.4 kg)   Height:  (1.854 m)     Assessment & Plan:   Hypertension Patient here to follow-up on his blood pressure.  Patient informs me that a few months after his last office visit 9 months ago he lost his disability status.  He also lost his medical coverage but  he has not been able to afford any of his medications since the beginning of the beginning of the year.  He had a sleep study in August and was diagnosed with OSA however he has been unable to afford the CPAP. He has also gained about 40 pounds due to inability to afford Ozempic. His blood pressure is elevated with SBP in the 160s to 180s. He reports feeling depressed about his weight gain and lack of insurance coverage but denies any headaches, shortness of breath, chest pain, palpitations changes in his vision or dizziness. We have provided him the orange card application as well as Medicaid form to fill out as soon as possible. I will send him BP meds on the $4 list in the meantime to get his BP better controlled.  Patient reports that he does not ever want to take losartan because it made him dizzy in the past.  Plan: -Start amlodipine 10 mg daily -Start lisinopril 20 mg daily -Refill metoprolol 50 mg twice daily -Follow-up in 2 weeks for repeat BP and BMP  Anxiety and depression Patient reports feeling more depressed over the last 6 months after losing his disability status. He has found a side job but it is not enough to pay his bills and his medications. He has gained about 40 pounds after coming off Ozempic last when he lost medical coverage. He feels tired all the time.  He empathized with patient's  current circumstances and advised him to fill out the orange card application as well as Medicaid form as soon as possible so we can get him the medical service he needs. He was previously doing CBT with Dr. Monna Fam last year and found it helpful so I will refer him to [facility] and consider starting SSRI at the next office visit.  -Referral to integrated behavioral health -PHQ-9 at next office visit  OSA (obstructive sleep apnea) Patient had a sleep study in August 2023 and found to have mild OSA. An AutoPap CPAP was ordered by neurology however patient states that he was not able to afford it. He  continues to feel fatigue throughout the day. He has gained over 40 pounds in the last 9 months. We have provided patient with the orange card application for medical coverage which should help with some of the cost associated with his CPAP.  Morbid obesity (HCC) Patient informs me that he has been very depressed about his recent weight gain. He was previously on Ozempic but once he lost his disability coverage and Medicaid, he was unable to afford Ozempic. Due to this, he has had increased cravings which has led to about 40 lb weight gain in the last 9 months. Patient's last weight in our clinic was 284 lbs in July 2023. I advised patient that it is not uncommon for patients to regain weight after going off Ozempic. We will work on getting him coverage for the other on his card and consider other options for weight loss.  Body mass index is 42.28 kg/m. Filed Weights   10/18/22 1553  Weight: (!) 320 lb 8 oz (145.4 kg)      See Encounters Tab for problem based charting.  Patient discussed with Dr. Yolande Jolly, MD, MPH

## 2022-10-18 NOTE — Assessment & Plan Note (Signed)
Patient informs me that he has been very depressed about his recent weight gain. He was previously on Ozempic but once he lost his disability coverage and Medicaid, he was unable to afford Ozempic. Due to this, he has had increased cravings which has led to about 40 lb weight gain in the last 9 months. Patient's last weight in our clinic was 284 lbs in July 2023. I advised patient that it is not uncommon for patients to regain weight after going off Ozempic. We will work on getting him coverage for the other on his card and consider other options for weight loss.  Body mass index is 42.28 kg/m. Filed Weights   10/18/22 1553  Weight: (!) 320 lb 8 oz (145.4 kg)

## 2022-10-18 NOTE — Assessment & Plan Note (Addendum)
Patient had a sleep study in August 2023 and found to have mild OSA. An AutoPap CPAP was ordered by neurology however patient states that he was not able to afford it. He continues to feel fatigue throughout the day. He has gained over 40 pounds in the last 9 months. We have provided patient with the orange card application for medical coverage which should help with some of the cost associated with his CPAP.

## 2022-10-18 NOTE — Assessment & Plan Note (Addendum)
Patient here to follow-up on his blood pressure.  Patient informs me that a few months after his last office visit 9 months ago he lost his disability status.  He also lost his medical coverage but he has not been able to afford any of his medications since the beginning of the beginning of the year.  He had a sleep study in August and was diagnosed with OSA however he has been unable to afford the CPAP. He has also gained about 40 pounds due to inability to afford Ozempic. His blood pressure is elevated with SBP in the 160s to 180s. He reports feeling depressed about his weight gain and lack of insurance coverage but denies any headaches, shortness of breath, chest pain, palpitations changes in his vision or dizziness. We have provided him the orange card application as well as Medicaid form to fill out as soon as possible. I will send him BP meds on the $4 list in the meantime to get his BP better controlled.  Patient reports that he does not ever want to take losartan because it made him dizzy in the past.  Plan: -Start amlodipine 10 mg daily -Start lisinopril 20 mg daily -Refill metoprolol 50 mg twice daily -Follow-up in 2 weeks for repeat BP and BMP

## 2022-10-18 NOTE — Patient Instructions (Addendum)
Thank you, Samuel Decker for allowing [place] to provide your care today. Today we discussed your blood pressure, weight gain and depression.  Please fill out the application so we can get you coverage for your medical care.  I have place a referrals to behavioral health  I have ordered the following medication/changed the following medications:  Start amlodipine 10 mg daily Start losartan 25 mg daily Continue metoprolol 50 mg twice daily Start atorvastatin 40 mg daily  My Chart Access: [email or web address]  Please follow-up in 2 weeks  Please make sure to arrive 15 minutes prior to your next appointment. If you arrive late, you may be asked to reschedule.    We look forward to seeing you next time. Please call our clinic at [phone] if you have any questions or concerns. The best time to call is Monday-Friday from 9am-4pm, but there is someone available 24/7. If after hours or the weekend, call the main hospital number and ask for the Internal Medicine Resident On-Call. If you need medication refills, please notify your pharmacy one week in advance and they will send [place] a request.   Thank you for letting us take part in your care. Wishing you the best!  Steffanie Rainwater, MD 10/18/2022, 4:30 PM IM Resident, PGY-3 [name] 41:10

## 2022-10-18 NOTE — Assessment & Plan Note (Addendum)
Patient reports feeling more depressed over the last 6 months after losing his disability status. He has found a side job but it is not enough to pay his bills and his medications. He has gained about 40 pounds after coming off Ozempic last when he lost medical coverage. He feels tired all the time.  He empathized with patient's current circumstances and advised him to fill out the orange card application as well as Medicaid form as soon as possible so we can get him the medical service he needs. He was previously doing CBT with Dr. Monna Fam last year and found it helpful so I will refer him to [facility] and consider starting SSRI at the next office visit.  -Referral to integrated behavioral health -PHQ-9 at next office visit

## 2022-10-19 ENCOUNTER — Other Ambulatory Visit (HOSPITAL_COMMUNITY): Payer: Self-pay

## 2022-10-22 ENCOUNTER — Telehealth: Payer: Self-pay | Admitting: Student

## 2022-10-22 NOTE — Telephone Encounter (Signed)
Contacted [name] to schedule their annual wellness visit. Patient declined to schedule AWV at this time.  Samuel Decker AWV direct phone # [phone]   Spoke with patient to schedule AWV.  He stated he no longer has medicare  he stated he is filling out paperwork for orange card

## 2022-11-01 ENCOUNTER — Encounter: Payer: Self-pay | Admitting: Student

## 2022-11-01 NOTE — Progress Notes (Signed)
Internal Medicine Clinic Attending  Case discussed with Dr. Amponsah  at the time of the visit.  We reviewed the resident's history and exam and pertinent patient test results.  I agree with the assessment, diagnosis, and plan of care documented in the resident's note.  

## 2022-11-05 ENCOUNTER — Encounter: Payer: Self-pay | Admitting: Student

## 2022-11-05 NOTE — Progress Notes (Deleted)
   CC: ***  HPI:  [name] is a 42 y.o. male with a past medical history listed below who presents for follow-up on hypertension.  Last visit: Coming in for a 2 week follow up. Elevated BP in the setting of not being able to afford his medications. At last visit, added 10 of amlodipine and 20 of lisinopril to 50 of metop Recheck blood pressure today  Most recent CMP on 11/30/2021 showing slightly decreased bicarb at 19, but otherwise within normal limits with creatinine 1.09 sodium 139.   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      Current Outpatient Medications:    amLODipine (NORVASC) 10 MG tablet, Take 1 tablet (10 mg total) by mouth daily., Disp: 30 tablet, Rfl: 11   aspirin 81 MG chewable tablet, Chew 1 tablet (81 mg total) by mouth daily., Disp: 90 tablet, Rfl: 3   atorvastatin (LIPITOR) 40 MG tablet, Take 1 tablet (40 mg total) by mouth daily., Disp: 30 tablet, Rfl: 11   Exenatide ER (BYDUREON BCISE) 2 MG/0.85ML AUIJ, Inject 2 mg as directed once a week., Disp: 6.8 mL, Rfl: 1   fluticasone (FLONASE) 50 MCG/ACT nasal spray, INSTILL 1 SPRAY INTO BOTH NOSTRILS DAILY, Disp: 16 mL, Rfl: 3   lisinopril (ZESTRIL) 20 MG tablet, Take 1 tablet (20 mg total) by mouth daily., Disp: 30 tablet, Rfl: 5   loratadine (CLARITIN) 10 MG tablet, TAKE 1 TABLET BY MOUTH EVERY DAY AS NEEDED FOR ALLERGY, Disp: 30 tablet, Rfl: 2   metoprolol tartrate (LOPRESSOR) 50 MG tablet, Take 1 tablet (50 mg total) by mouth 2 (two) times daily., Disp: 60 tablet,  Rfl: 3   olopatadine (PATANOL) 0.1 % ophthalmic solution, Place 1 drop into both eyes daily., Disp: , Rfl:   Review of Systems:  ***  Constitutional: Eye: Respiratory: Cardiovascular: GI: MSK: GU: Skin: Neuro: Endocrine:   Physical Exam:  There were no vitals filed for this visit. *** General: Patient is sitting comfortably in the room  Eyes: Pupils equal and reactive to light, EOM intact  Head: Normocephalic, atraumatic  Neck: Supple, nontender, full range of motion, No JVD Cardio: Regular rate and rhythm, no murmurs, rubs or gallops. 2+ pulses to bilateral upper and lower extremities  Chest: No chest tenderness Pulmonary: Clear to ausculation bilaterally with no rales, rhonchi, and crackles  Abdomen: Soft, nontender with normoactive bowel sounds with no rebound or guarding  Neuro: Alert and orientated x3. CN II-XII intact. Sensation intact to upper and lower extremities. 2+ patellar reflex.  Back: No midline tenderness, no step off or deformities noted. No paraspinal muscle tenderness.  Skin: No rashes noted  MSK: 5/5 strength to upper and lower extremities.    Assessment & Plan:   No problem-specific Assessment & Plan notes found for this encounter.    Patient {GC/GE:[id number]::"discussed with","seen with"} Dr. {NAMES:[id number]::"Guilloud","Hoffman","Mullen","Narendra","Williams","Vincent"}  Modena Slater, DO PGY-1 Internal Medicine Resident  Pager: [phone]

## 2022-11-22 ENCOUNTER — Encounter: Payer: Self-pay | Admitting: Licensed Clinical Social Worker

## 2022-11-22 ENCOUNTER — Institutional Professional Consult (permissible substitution): Payer: Self-pay | Admitting: Licensed Clinical Social Worker

## 2022-11-22 NOTE — Progress Notes (Signed)
Patient no-showed today's appointment; appointment was for in-person visit at 2:00 Pm  Patient will need to reschedule appointment by calling Internal medicine center [phone].  Shepard Keltz, MSW, LCSW-A She/Her Behavioral Health Clinician [place]  Internal Medicine Center Direct Dial:[phone]  Fax [phone] Main Office Phone: [phone] [address] [place], [place] [address] Website: [facility]  Primary Care  [place], [place]      

## 2022-11-22 NOTE — Progress Notes (Deleted)
Patient no-showed today's appointment; appointment was for in-person visit at 2:00 Pm  Patient will need to reschedule appointment by calling Internal medicine center [phone].  Christen Butter, MSW, LCSW-A She/Her Behavioral Health Clinician [facility]  Internal Medicine Center Direct [phone]  Fax [phone] Main Office Phone: [phone] [address] [place] [place], [place] [address] Website: [facility] Internal Medicine [facility]  [place], [place]  [place]

## 2022-11-26 ENCOUNTER — Encounter: Payer: Self-pay | Admitting: *Deleted

## 2022-12-14 ENCOUNTER — Other Ambulatory Visit (HOSPITAL_COMMUNITY): Payer: Self-pay

## 2023-03-12 ENCOUNTER — Other Ambulatory Visit (HOSPITAL_COMMUNITY): Payer: Self-pay

## 2023-03-13 ENCOUNTER — Other Ambulatory Visit (HOSPITAL_COMMUNITY): Payer: Self-pay

## 2023-03-13 ENCOUNTER — Telehealth: Payer: Self-pay

## 2023-03-13 NOTE — Transitions of Care (Post Inpatient/ED Visit) (Signed)
   03/13/2023  Name: Samuel Decker MRN: [id number] DOB: Feb 26, 1981  Today's TOC FU Call Status: Today's TOC FU Call Status:: Successful TOC FU Call Completed TOC FU Call Complete Date: 03/13/23 Patient's Name and Date of Birth confirmed.  Transition Care Management Follow-up Telephone Call Date of Discharge: 03/12/23 Discharge Facility: Other [facility]) Name of Other ([facility]) Discharge Facility: [facility] Type of Discharge: Inpatient Admission Primary Inpatient Discharge Diagnosis:: pancreatitis How have you been since you were released from the hospital?: Better Any questions or concerns?: No  Items Reviewed: Did you receive and understand the discharge instructions provided?: Yes Medications obtained,verified, and reconciled?: Yes (Medications Reviewed) Any new allergies since your discharge?: No Dietary orders reviewed?: Yes Do you have support at home?: Yes People in Home: parent(s)  Medications Reviewed Today: Medications Reviewed Today     Reviewed by Karena Addison, LPN (Licensed Practical Nurse) on 03/13/23 at 1019  Med List Status: <None>   Medication Order Taking? Sig Documenting Provider Last Dose Status Informant  amLODipine (NORVASC) 10 MG tablet [id number]  Take 1 tablet (10 mg total) by mouth daily. Steffanie Rainwater, MD  Active   aspirin 81 MG chewable tablet [id number] No Chew 1 tablet (81 mg total) by mouth daily. Dolan Amen, MD Taking Active   atorvastatin (LIPITOR) 40 MG tablet [id number]  Take 1 tablet (40 mg total) by mouth daily. Steffanie Rainwater, MD  Active   Exenatide ER (BYDUREON BCISE) 2 MG/0.85ML Virl Cagey [id number]  Inject 2 mg as directed once a week. Steffanie Rainwater, MD  Active   fluticasone [facility]) 50 MCG/ACT nasal spray [id number] No INSTILL 1 SPRAY INTO BOTH NOSTRILS DAILY Amponsah, Flossie Buffy, MD Taking Active   lisinopril (ZESTRIL) 20 MG tablet [id number]  Take 1 tablet (20 mg total) by mouth daily. Steffanie Rainwater, MD  Active   loratadine (CLARITIN) 10 MG tablet [id number]  TAKE 1 TABLET BY MOUTH EVERY DAY AS NEEDED FOR ALLERGY Nooruddin, Jason Fila, MD  Active   metoprolol tartrate (LOPRESSOR) 50 MG tablet [id number]  Take 1 tablet (50 mg total) by mouth 2 (two) times daily. Steffanie Rainwater, MD  Active   olopatadine (PATANOL) 0.1 % ophthalmic solution [id number] No Place 1 drop into both eyes daily. [provider] Taking Active             Home Care and Equipment/Supplies: Were Home Health Services Ordered?: NA Any new equipment or medical supplies ordered?: NA  Functional Questionnaire: Do you need assistance with bathing/showering or dressing?: No Do you need assistance with meal preparation?: No Do you need assistance with eating?: No Do you have difficulty maintaining continence: No Do you need assistance with getting out of bed/getting out of a chair/moving?: No Do you have difficulty managing or taking your medications?: No  Follow up appointments reviewed: PCP Follow-up appointment confirmed?: No (declined, will call back later) Specialist Hospital Follow-up appointment confirmed?: NA Do you need transportation to your follow-up appointment?: No Do you understand care options if your condition(s) worsen?: Yes-patient verbalized understanding    SIGNATURE Karena Addison, LPN [facility] Nurse Health Advisor Direct Dial [phone]

## 2023-04-12 ENCOUNTER — Other Ambulatory Visit (HOSPITAL_COMMUNITY): Payer: Self-pay

## 2023-06-05 ENCOUNTER — Other Ambulatory Visit: Payer: Self-pay | Admitting: Student

## 2023-06-05 ENCOUNTER — Other Ambulatory Visit (HOSPITAL_COMMUNITY): Payer: Self-pay

## 2023-06-05 MED ORDER — METOPROLOL TARTRATE 50 MG PO TABS
50.0000 mg | ORAL_TABLET | Freq: Two times a day (BID) | ORAL | 0 refills | Status: DC
  Filled 2023-06-05 – 2023-06-27 (×2): qty 60, 30d supply, fill #0

## 2023-06-05 NOTE — Telephone Encounter (Signed)
LOV 10/18/2022. Last BP 184/111. I called pt to schedule an appt. Appt scheduled with Dr Carlynn Purl on 06/20/23.

## 2023-06-17 ENCOUNTER — Other Ambulatory Visit (HOSPITAL_COMMUNITY): Payer: Self-pay

## 2023-06-20 ENCOUNTER — Encounter: Payer: Self-pay | Admitting: Internal Medicine

## 2023-06-20 ENCOUNTER — Other Ambulatory Visit (HOSPITAL_COMMUNITY): Payer: Self-pay

## 2023-06-20 ENCOUNTER — Ambulatory Visit: Payer: Self-pay | Admitting: Internal Medicine

## 2023-06-20 VITALS — BP 182/109 | HR 69 | Temp 97.6°F | Ht 73.0 in | Wt 305.1 lb

## 2023-06-20 DIAGNOSIS — M109 Gout, unspecified: Secondary | ICD-10-CM

## 2023-06-20 DIAGNOSIS — I10 Essential (primary) hypertension: Secondary | ICD-10-CM

## 2023-06-20 DIAGNOSIS — M1000 Idiopathic gout, unspecified site: Secondary | ICD-10-CM

## 2023-06-20 DIAGNOSIS — Z87898 Personal history of other specified conditions: Secondary | ICD-10-CM

## 2023-06-20 DIAGNOSIS — E7849 Other hyperlipidemia: Secondary | ICD-10-CM

## 2023-06-20 DIAGNOSIS — E785 Hyperlipidemia, unspecified: Secondary | ICD-10-CM

## 2023-06-20 MED ORDER — COLCHICINE 0.6 MG PO TABS
0.6000 mg | ORAL_TABLET | Freq: Every day | ORAL | 2 refills | Status: AC | PRN
  Filled 2023-06-20: qty 30, 30d supply, fill #0
  Filled 2023-08-29 – 2023-09-10 (×2): qty 30, 30d supply, fill #1

## 2023-06-20 MED ORDER — ALLOPURINOL 100 MG PO TABS
100.0000 mg | ORAL_TABLET | Freq: Every day | ORAL | 2 refills | Status: AC
  Filled 2023-06-20: qty 30, 30d supply, fill #0
  Filled 2023-08-29 – 2023-09-10 (×2): qty 30, 30d supply, fill #1
  Filled 2024-03-31: qty 30, 30d supply, fill #2

## 2023-06-20 NOTE — Progress Notes (Signed)
Subjective:  CC: routine visit  HPI:  [name] is a 42 y.o. male with a past medical history stated below and presents today for above. Please see problem based assessment and plan for additional details.  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     Current Outpatient Medications on File Prior to Visit  Medication Sig Dispense Refill   amLODipine (NORVASC) 10 MG tablet Take 1 tablet (10 mg total) by mouth daily. 30 tablet 11   aspirin 81 MG chewable tablet Chew 1 tablet (81 mg total) by mouth daily. 90 tablet 3   atorvastatin (LIPITOR) 40 MG tablet Take 1 tablet (40 mg total) by mouth daily. 30 tablet 11   fluticasone (FLONASE) 50 MCG/ACT nasal spray INSTILL 1 SPRAY INTO BOTH NOSTRILS DAILY 16 mL 3   lisinopril (ZESTRIL) 20 MG tablet Take 1 tablet (20 mg total) by mouth daily. 30 tablet 5   loratadine (CLARITIN) 10 MG tablet TAKE 1 TABLET BY MOUTH EVERY DAY AS NEEDED FOR ALLERGY 30 tablet 2   metoprolol tartrate (LOPRESSOR) 50 MG tablet Take 1 tablet (50 mg total) by mouth 2 (two) times daily. 60 tablet 0   olopatadine (PATANOL) 0.1 % ophthalmic solution Place 1 drop into both eyes daily.     No current facility-administered medications on file prior to visit.    Review of Systems: ROS negative except for as is noted on the assessment and plan.  Objective:   Vitals:   06/20/23 1554 06/20/23 1629  BP: (!) 183/110 (!) 182/109  Pulse: 81 69  Temp: 97.6 F (36.4 C)   TempSrc: Oral   SpO2: 100%   Weight: (!) 305 lb 1.6 oz  (138.4 kg)   Height: 6\' 1"  (1.854 m)     Physical Exam: Constitutional: well-appearing, in no acute distress HENT: normocephalic atraumatic, mucous membranes moist Cardiovascular: regular rate and rhythm, no m/r/g Pulmonary/Chest: normal work of breathing on room air, lungs clear to auscultation bilaterally  Assessment & Plan:   Hypertension Patient has been out of his amlodipine and lisinopril for several days, currently only taking metoprolol for blood pressure control. Per medication fill history, it does not appear he has been receiving any of his medications consistently. Patient plans to pick up refills of all his medications this week. Follow up visit planned for four weeks to recheck BP. Can make adjustments to current regimen of amlodipine 10mg , lisinopril 20mg , metoprolol tartrate 50mg  BID once he is consistently taking them.  - BP recheck in 1 month - BMP at next visit  GOUT Patient reports multiple gout flares in the past several weeks. He is currently on the tail end of a pain episode. Pain is mainly located to right great toe. His biggest trigger is red meat; he does not drink beer or eat shellfish. He uses Advil to control pain during flare ups which is moderately effective.  - Colchicine prescribed for pain during flare-ups,  allopurinol for maintenance once current flare subsides.   History of prediabetes A1c has not been checked for years.  - A1c at next visit, patient's insurance will be in effect then  Hyperlipidemia Has not had lipid profile checked in years. Currently takes atorvastatin intermittently.  - Lipid profile at next visit  Severe obesity (BMI >= 40) (HCC) Patient was previously on Ozempic with good weight loss, discontinued as he lost insurance. He may be interested in restarting once his new insurance kicks in.     Patient seen with Dr. Melida Quitter MD [facility] Internal [facility]  PGY-1 Pager: [phone] Date 06/20/2023  Time 9:28  PM

## 2023-06-20 NOTE — Assessment & Plan Note (Signed)
Patient reports multiple gout flares in the past several weeks. He is currently on the tail end of a pain episode. Pain is mainly located to right great toe. His biggest trigger is red meat; he does not drink beer or eat shellfish. He uses Advil to control pain during flare ups which is moderately effective.  - Colchicine prescribed for pain during flare-ups, allopurinol for maintenance once current flare subsides.

## 2023-06-20 NOTE — Assessment & Plan Note (Addendum)
Patient has been out of his amlodipine and lisinopril for several days, currently only taking metoprolol for blood pressure control. Per medication fill history, it does not appear he has been receiving any of his medications consistently. Patient plans to pick up refills of all his medications this week. Follow up visit planned for four weeks to recheck BP. Can make adjustments to current regimen of amlodipine 10mg , lisinopril 20mg , metoprolol tartrate 50mg  BID once he is consistently taking them.  - BP recheck in 1 month - BMP at next visit

## 2023-06-20 NOTE — Patient Instructions (Signed)
Samuel Decker,   It was a pleasure meeting you today.   For your gout, I recommend taking the colchicine when you are having pain. Once your current pain is getting better, start taking the allopurinol daily. This will help prevent further gout flares.   Take your blood pressure medications. And use your CPAP.   We will plan on seeing you again in about a month to recheck your blood pressure and check other blood work.   Thanks,  Dr Carlynn Purl

## 2023-06-20 NOTE — Assessment & Plan Note (Signed)
Has not had lipid profile checked in years. Currently takes atorvastatin intermittently.  - Lipid profile at next visit

## 2023-06-20 NOTE — Assessment & Plan Note (Signed)
Patient was previously on Ozempic with good weight loss, discontinued as he lost insurance. He may be interested in restarting once his new insurance kicks in.

## 2023-06-20 NOTE — Assessment & Plan Note (Signed)
A1c has not been checked for years.  - A1c at next visit, patient's insurance will be in effect then

## 2023-06-27 ENCOUNTER — Other Ambulatory Visit (HOSPITAL_COMMUNITY): Payer: Self-pay

## 2023-07-01 NOTE — Progress Notes (Signed)
Internal Medicine Clinic Attending  I was physically present during the key portions of the resident provided service and participated in the medical decision making of patient's management care. I reviewed pertinent patient test results.  The assessment, diagnosis, and plan were formulated together and I agree with the documentation in the resident's note.  Williams, Julie Anne, MD  

## 2023-08-14 ENCOUNTER — Encounter: Payer: Self-pay | Admitting: Student

## 2023-08-29 ENCOUNTER — Other Ambulatory Visit: Payer: Self-pay

## 2023-08-29 ENCOUNTER — Other Ambulatory Visit: Payer: Self-pay | Admitting: Internal Medicine

## 2023-08-29 ENCOUNTER — Other Ambulatory Visit (HOSPITAL_COMMUNITY): Payer: Self-pay

## 2023-08-29 MED ORDER — METOPROLOL TARTRATE 50 MG PO TABS
50.0000 mg | ORAL_TABLET | Freq: Two times a day (BID) | ORAL | 3 refills | Status: AC
  Filled 2023-08-29 – 2023-09-10 (×2): qty 60, 30d supply, fill #0
  Filled 2024-03-31: qty 60, 30d supply, fill #1

## 2023-08-29 NOTE — Telephone Encounter (Signed)
 Medication sent to pharmacy

## 2023-09-10 ENCOUNTER — Other Ambulatory Visit (HOSPITAL_COMMUNITY): Payer: Self-pay

## 2024-03-24 ENCOUNTER — Other Ambulatory Visit: Payer: Self-pay

## 2024-03-31 ENCOUNTER — Other Ambulatory Visit (HOSPITAL_COMMUNITY): Payer: Self-pay

## 2024-03-31 ENCOUNTER — Other Ambulatory Visit: Payer: Self-pay

## 2024-04-09 ENCOUNTER — Other Ambulatory Visit (HOSPITAL_COMMUNITY): Payer: Self-pay
